# Patient Record
Sex: Female | Born: 1945 | Race: White | Hispanic: No | Marital: Married | Smoking: Former smoker
Health system: Southern US, Community
[De-identification: ages and names within clinical notes are randomized; demographics above are authoritative.]

## PROBLEM LIST (undated history)

## (undated) DIAGNOSIS — J45909 Unspecified asthma, uncomplicated: Secondary | ICD-10-CM

## (undated) DIAGNOSIS — L439 Lichen planus, unspecified: Secondary | ICD-10-CM

## (undated) DIAGNOSIS — N2889 Other specified disorders of kidney and ureter: Secondary | ICD-10-CM

## (undated) DIAGNOSIS — R0600 Dyspnea, unspecified: Secondary | ICD-10-CM

## (undated) DIAGNOSIS — R112 Nausea with vomiting, unspecified: Secondary | ICD-10-CM

## (undated) DIAGNOSIS — D649 Anemia, unspecified: Secondary | ICD-10-CM

## (undated) DIAGNOSIS — E7800 Pure hypercholesterolemia, unspecified: Secondary | ICD-10-CM

## (undated) DIAGNOSIS — M1990 Unspecified osteoarthritis, unspecified site: Secondary | ICD-10-CM

## (undated) DIAGNOSIS — E119 Type 2 diabetes mellitus without complications: Secondary | ICD-10-CM

## (undated) DIAGNOSIS — M549 Dorsalgia, unspecified: Secondary | ICD-10-CM

## (undated) DIAGNOSIS — K219 Gastro-esophageal reflux disease without esophagitis: Secondary | ICD-10-CM

## (undated) DIAGNOSIS — Z9889 Other specified postprocedural states: Secondary | ICD-10-CM

## (undated) DIAGNOSIS — I10 Essential (primary) hypertension: Secondary | ICD-10-CM

## (undated) HISTORY — DX: Unspecified asthma, uncomplicated: J45.909

## (undated) HISTORY — DX: Lichen planus, unspecified: L43.9

---
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
## 2000-10-02 ENCOUNTER — Other Ambulatory Visit: Admission: RE | Admit: 2000-10-02 | Discharge: 2000-10-02 | Payer: Self-pay | Admitting: *Deleted

## 2000-10-06 ENCOUNTER — Encounter: Payer: Self-pay | Admitting: Family Medicine

## 2000-10-06 ENCOUNTER — Ambulatory Visit (HOSPITAL_COMMUNITY): Admission: RE | Admit: 2000-10-06 | Discharge: 2000-10-06 | Payer: Self-pay | Admitting: Family Medicine

## 2002-02-07 ENCOUNTER — Ambulatory Visit (HOSPITAL_COMMUNITY): Admission: RE | Admit: 2002-02-07 | Discharge: 2002-02-07 | Payer: Self-pay | Admitting: Family Medicine

## 2002-02-07 ENCOUNTER — Encounter: Payer: Self-pay | Admitting: Family Medicine

## 2002-08-17 ENCOUNTER — Encounter: Payer: Self-pay | Admitting: Preventative Medicine

## 2002-08-17 ENCOUNTER — Encounter (HOSPITAL_COMMUNITY): Admission: RE | Admit: 2002-08-17 | Discharge: 2002-09-16 | Payer: Self-pay | Admitting: Preventative Medicine

## 2002-09-07 ENCOUNTER — Encounter (HOSPITAL_COMMUNITY): Admission: RE | Admit: 2002-09-07 | Discharge: 2002-10-07 | Payer: Self-pay | Admitting: Orthopaedic Surgery

## 2002-10-11 ENCOUNTER — Encounter (HOSPITAL_COMMUNITY): Admission: RE | Admit: 2002-10-11 | Discharge: 2002-11-10 | Payer: Self-pay | Admitting: Orthopaedic Surgery

## 2003-03-29 ENCOUNTER — Ambulatory Visit (HOSPITAL_COMMUNITY): Admission: RE | Admit: 2003-03-29 | Discharge: 2003-03-29 | Payer: Self-pay | Admitting: Internal Medicine

## 2004-03-29 ENCOUNTER — Ambulatory Visit (HOSPITAL_COMMUNITY): Admission: RE | Admit: 2004-03-29 | Discharge: 2004-03-29 | Payer: Self-pay | Admitting: Internal Medicine

## 2005-10-23 ENCOUNTER — Ambulatory Visit (HOSPITAL_COMMUNITY): Admission: RE | Admit: 2005-10-23 | Discharge: 2005-10-23 | Payer: Self-pay | Admitting: Family Medicine

## 2006-06-02 HISTORY — PX: COLONOSCOPY: SHX174

## 2006-11-09 ENCOUNTER — Ambulatory Visit (HOSPITAL_COMMUNITY): Admission: RE | Admit: 2006-11-09 | Discharge: 2006-11-09 | Payer: Self-pay | Admitting: Family Medicine

## 2006-12-28 ENCOUNTER — Ambulatory Visit (HOSPITAL_COMMUNITY): Admission: RE | Admit: 2006-12-28 | Discharge: 2006-12-28 | Payer: Self-pay | Admitting: Internal Medicine

## 2006-12-28 ENCOUNTER — Ambulatory Visit: Payer: Self-pay | Admitting: Internal Medicine

## 2006-12-28 ENCOUNTER — Encounter: Payer: Self-pay | Admitting: Internal Medicine

## 2007-07-11 ENCOUNTER — Emergency Department (HOSPITAL_COMMUNITY): Admission: EM | Admit: 2007-07-11 | Discharge: 2007-07-11 | Payer: Self-pay | Admitting: Family Medicine

## 2010-06-22 ENCOUNTER — Encounter: Payer: Self-pay | Admitting: Internal Medicine

## 2010-10-15 NOTE — Op Note (Signed)
NAME:  Kristine Zamora, Kristine Zamora                ACCOUNT NO.:  [id number]   MEDICAL RECORD NO.:  [id number]          PATIENT TYPE:  AMB   LOCATION:  DAY                           FACILITY:  [facility]   PHYSICIAN:  R. Roetta Sessions, M.D. DATE OF BIRTH:  Jan 30, 1946   DATE OF PROCEDURE:  12/28/2006  DATE OF DISCHARGE:                               OPERATIVE REPORT   PROCEDURE:  Colonoscopy with biopsy.   INDICATIONS FOR PROCEDURE:  Patient is a 65 year old lady who comes for  colorectal cancer screening.   Primary care physician is Dr. Corrie Mckusick, M.D.   Family history is significant in that her father succumbed to colorectal  cancer at age 17.  She reports having a colonoscopy some 12 years ago  without significant findings.  She has no lower GI tract symptoms.  Colonoscopy is now being done.  This approach has been discussed with  the patient at length.  Potential risks, benefits and alternatives have  been reviewed, questions answered, she is agreeable.  Please see  documentation on the medical record.   PROCEDURE NOTE:  O2 saturation, blood pressure, pulse, respirations were  monitored throughout the entire procedure.   CONSCIOUS SEDATION:  Versed 95 mg IV Demerol 75 mg IV in divided doses.   INSTRUMENT:  Pentax video chip system.   FINDINGS:  Digital rectal exam revealed no abnormalities.   ENDOSCOPIC FINDINGS:  Prep was good.   Colon:  Colonic mucosa was surveyed from the rectosigmoid junction  through the left transverse and right colon to the area of appendiceal  orifice, ileocecal valve and cecum.  These structures were well seen and  photographed for the record.  From this level the scope was slowly and  cautiously withdrawn.  All previously mentioned mucosal surfaces were  again seen.  The patient had pancolonic diverticula and diminutive 3 mm  at the rectosigmoid junction.  The remainder of colon mucosa appeared  normal.  The polyp was cold biopsied/removed.  The scope was then  pulled  down in the rectum where thorough examination of the rectal mucosa  including retroflexed view of the anal verge demonstrated no  abnormalities.  The patient tolerated procedure well as reactive to  endoscopy.   IMPRESSION:  Normal rectum, rectosigmoid polyp (diminutive), cold  biopsied/removed, pancolonic diverticula.  Remainder of colonic mucosa  appeared normal.   RECOMMENDATIONS:  1. Diverticulosis literature provided to [name].  2. Follow-up on path.  3. Further recommendations to follow.      Jonathon Bellows, M.D.  Electronically Signed     RMR/MEDQ  D:  12/28/2006  T:  12/28/2006  Job:  [id number]   cc:   Corrie Mckusick, M.D.  Fax: [phone]

## 2011-02-12 ENCOUNTER — Other Ambulatory Visit: Payer: Self-pay | Admitting: Interventional Radiology

## 2011-02-12 DIAGNOSIS — I83813 Varicose veins of bilateral lower extremities with pain: Secondary | ICD-10-CM

## 2011-02-25 ENCOUNTER — Inpatient Hospital Stay: Admission: RE | Admit: 2011-02-25 | Payer: Self-pay | Source: Ambulatory Visit

## 2011-05-30 ENCOUNTER — Other Ambulatory Visit (HOSPITAL_COMMUNITY): Payer: Self-pay | Admitting: Family Medicine

## 2011-05-30 DIAGNOSIS — Z139 Encounter for screening, unspecified: Secondary | ICD-10-CM

## 2011-06-05 ENCOUNTER — Ambulatory Visit (HOSPITAL_COMMUNITY)
Admission: RE | Admit: 2011-06-05 | Discharge: 2011-06-05 | Disposition: A | Discharge status: Home / Self Care | Payer: Medicare Other | Source: Ambulatory Visit | Attending: Family Medicine | Admitting: Family Medicine

## 2011-06-05 DIAGNOSIS — Z139 Encounter for screening, unspecified: Secondary | ICD-10-CM

## 2011-06-05 DIAGNOSIS — Z1231 Encounter for screening mammogram for malignant neoplasm of breast: Secondary | ICD-10-CM | POA: Insufficient documentation

## 2011-06-05 DIAGNOSIS — M818 Other osteoporosis without current pathological fracture: Secondary | ICD-10-CM | POA: Insufficient documentation

## 2011-12-01 ENCOUNTER — Encounter: Payer: Self-pay | Admitting: Internal Medicine

## 2011-12-29 ENCOUNTER — Ambulatory Visit (HOSPITAL_COMMUNITY)
Admission: RE | Admit: 2011-12-29 | Discharge: 2011-12-29 | Disposition: A | Discharge status: Home / Self Care | Payer: Medicare Other | Source: Ambulatory Visit | Attending: Family Medicine | Admitting: Family Medicine

## 2011-12-29 ENCOUNTER — Other Ambulatory Visit (HOSPITAL_COMMUNITY): Payer: Self-pay | Admitting: Family Medicine

## 2011-12-29 DIAGNOSIS — J209 Acute bronchitis, unspecified: Secondary | ICD-10-CM

## 2011-12-29 DIAGNOSIS — J069 Acute upper respiratory infection, unspecified: Secondary | ICD-10-CM | POA: Insufficient documentation

## 2011-12-29 DIAGNOSIS — R05 Cough: Secondary | ICD-10-CM | POA: Insufficient documentation

## 2011-12-29 DIAGNOSIS — R059 Cough, unspecified: Secondary | ICD-10-CM | POA: Insufficient documentation

## 2012-10-05 ENCOUNTER — Other Ambulatory Visit (HOSPITAL_COMMUNITY): Payer: Self-pay | Admitting: Family Medicine

## 2012-10-05 DIAGNOSIS — Z139 Encounter for screening, unspecified: Secondary | ICD-10-CM

## 2012-10-11 ENCOUNTER — Ambulatory Visit (HOSPITAL_COMMUNITY)
Admission: RE | Admit: 2012-10-11 | Discharge: 2012-10-11 | Disposition: A | Discharge status: Home / Self Care | Payer: Medicare Other | Source: Ambulatory Visit | Attending: Family Medicine | Admitting: Family Medicine

## 2012-10-11 DIAGNOSIS — Z1231 Encounter for screening mammogram for malignant neoplasm of breast: Secondary | ICD-10-CM | POA: Insufficient documentation

## 2012-10-11 DIAGNOSIS — Z139 Encounter for screening, unspecified: Secondary | ICD-10-CM

## 2012-10-12 ENCOUNTER — Other Ambulatory Visit: Payer: Self-pay | Admitting: Family Medicine

## 2012-10-12 DIAGNOSIS — R928 Other abnormal and inconclusive findings on diagnostic imaging of breast: Secondary | ICD-10-CM

## 2012-10-20 ENCOUNTER — Ambulatory Visit (HOSPITAL_COMMUNITY)
Admission: RE | Admit: 2012-10-20 | Discharge: 2012-10-20 | Disposition: A | Discharge status: Home / Self Care | Payer: Medicare Other | Source: Ambulatory Visit | Attending: Family Medicine | Admitting: Family Medicine

## 2012-10-20 DIAGNOSIS — R928 Other abnormal and inconclusive findings on diagnostic imaging of breast: Secondary | ICD-10-CM

## 2012-12-16 ENCOUNTER — Encounter (HOSPITAL_COMMUNITY): Payer: Self-pay | Admitting: *Deleted

## 2012-12-16 ENCOUNTER — Emergency Department (HOSPITAL_COMMUNITY)
Admission: EM | Admit: 2012-12-16 | Discharge: 2012-12-16 | Disposition: A | Discharge status: Home / Self Care | Payer: Medicare Other | Attending: Emergency Medicine | Admitting: Emergency Medicine

## 2012-12-16 DIAGNOSIS — Z79899 Other long term (current) drug therapy: Secondary | ICD-10-CM | POA: Insufficient documentation

## 2012-12-16 DIAGNOSIS — R0609 Other forms of dyspnea: Secondary | ICD-10-CM | POA: Insufficient documentation

## 2012-12-16 DIAGNOSIS — I10 Essential (primary) hypertension: Secondary | ICD-10-CM | POA: Insufficient documentation

## 2012-12-16 DIAGNOSIS — R109 Unspecified abdominal pain: Secondary | ICD-10-CM | POA: Insufficient documentation

## 2012-12-16 DIAGNOSIS — T7840XA Allergy, unspecified, initial encounter: Secondary | ICD-10-CM

## 2012-12-16 DIAGNOSIS — R0989 Other specified symptoms and signs involving the circulatory and respiratory systems: Secondary | ICD-10-CM | POA: Insufficient documentation

## 2012-12-16 DIAGNOSIS — L988 Other specified disorders of the skin and subcutaneous tissue: Secondary | ICD-10-CM | POA: Insufficient documentation

## 2012-12-16 DIAGNOSIS — E119 Type 2 diabetes mellitus without complications: Secondary | ICD-10-CM | POA: Insufficient documentation

## 2012-12-16 DIAGNOSIS — R110 Nausea: Secondary | ICD-10-CM | POA: Insufficient documentation

## 2012-12-16 DIAGNOSIS — T394X5A Adverse effect of antirheumatics, not elsewhere classified, initial encounter: Secondary | ICD-10-CM | POA: Insufficient documentation

## 2012-12-16 HISTORY — DX: Essential (primary) hypertension: I10

## 2012-12-16 HISTORY — DX: Dorsalgia, unspecified: M54.9

## 2012-12-16 HISTORY — DX: Type 2 diabetes mellitus without complications: E11.9

## 2012-12-16 LAB — CBC WITH DIFFERENTIAL/PLATELET
Basophils Absolute: 0 10*3/uL (ref 0.0–0.1)
Basophils Relative: 0 % (ref 0–1)
Eosinophils Absolute: 0.1 10*3/uL (ref 0.0–0.7)
Eosinophils Relative: 1 % (ref 0–5)
HCT: 40.5 % (ref 36.0–46.0)
MCH: 29.3 pg (ref 26.0–34.0)
MCHC: 33.3 g/dL (ref 30.0–36.0)
MCV: 87.9 fL (ref 78.0–100.0)
Monocytes Absolute: 0.6 10*3/uL (ref 0.1–1.0)
Neutro Abs: 11.4 10*3/uL — ABNORMAL HIGH (ref 1.7–7.7)
RDW: 13 % (ref 11.5–15.5)

## 2012-12-16 LAB — BASIC METABOLIC PANEL
Calcium: 8.6 mg/dL (ref 8.4–10.5)
Creatinine, Ser: 0.59 mg/dL (ref 0.50–1.10)
GFR calc Af Amer: >90 mL/min (ref >90)

## 2012-12-16 LAB — GLUCOSE, CAPILLARY: Glucose-Capillary: 211 mg/dL — ABNORMAL HIGH (ref 70–99)

## 2012-12-16 LAB — LACTIC ACID, PLASMA: Lactic Acid, Venous: 4 mmol/L — ABNORMAL HIGH (ref 0.5–2.2)

## 2012-12-16 MED ORDER — ONDANSETRON HCL 4 MG/2ML IJ SOLN
4.0000 mg | Freq: Once | INTRAMUSCULAR | Status: AC
  Administered 2012-12-16: 4 mg via INTRAVENOUS
  Filled 2012-12-16: qty 2

## 2012-12-16 MED ORDER — FAMOTIDINE IN NACL 20-0.9 MG/50ML-% IV SOLN
20.0000 mg | Freq: Once | INTRAVENOUS | Status: AC
  Administered 2012-12-16: 20 mg via INTRAVENOUS
  Filled 2012-12-16: qty 50

## 2012-12-16 MED ORDER — SODIUM CHLORIDE 0.9 % IV SOLN: Freq: Once | INTRAVENOUS | Status: AC

## 2012-12-16 MED ORDER — METHYLPREDNISOLONE SODIUM SUCC 125 MG IJ SOLR: 125.0000 mg | Freq: Once | INTRAMUSCULAR | Status: DC

## 2012-12-16 NOTE — ED Notes (Signed)
Pt arrives with IV from EMS, NS bolus infusing.

## 2012-12-16 NOTE — ED Notes (Signed)
Pt arrives by EMS, generalized red skin, puffy eyes, swollen hands. Pt states she took her husband meds before bedtime, nausea, husband called EMS.

## 2012-12-16 NOTE — ED Provider Notes (Signed)
History    CSN: [id number] Arrival date & time 12/16/12  0501  First MD Initiated Contact with Patient 12/16/12 [phone]     Chief Complaint  Patient presents with  . Allergic Reaction   (Consider location/radiation/quality/duration/timing/severity/associated sxs/prior Treatment) Patient is a 67 y.o. female presenting with allergic reaction. The history is provided by the patient.  Allergic Reaction She noted onset about 30 minutes ago of a burning sensation in her arms and legs and some abdominal pain as well as dyspnea. She denies chest pain or difficulty swallowing. There was mild dyspnea and nausea without vomiting. She states that her mouth is dry. Symptoms occurred about 2 hours after taking a dose of Zanaflex and diclofenac. She is taking these medications before without incident. She took medications because she has been having pain in the right side of her back for about the last 3 weeks. She called EMS who have given her epinephrine and diphenhydramine with moderate improvement. Past Medical History  Diagnosis Date  . Diabetes mellitus without complication   . Hypertension   . Back pain    History reviewed. No pertinent past surgical history. History reviewed. No pertinent family history. History  Substance Use Topics  . Smoking status: Not on file  . Smokeless tobacco: Not on file  . Alcohol Use: No   OB History   Grav Para Term Preterm Abortions TAB SAB Ect Mult Living                 Review of Systems  All other systems reviewed and are negative.    Allergies  Review of patient's allergies indicates no known allergies.  Home Medications   Current Outpatient Rx  Name  Route  Sig  Dispense  Refill  . lisinopril (PRINIVIL,ZESTRIL) 10 MG tablet   Oral   Take 10 mg by mouth daily.         . metFORMIN (GLUMETZA) 1000 MG (MOD) 24 hr tablet   Oral   Take 1,000 mg by mouth daily with breakfast.          BP 96/58  Pulse 105  Temp(Src) 97.5 F (36.4 C)  (Oral)  Resp 20  Ht 5\' 2"  (1.575 m)  Wt 240 lb (108.863 kg)  BMI 43.89 kg/m2  SpO2 95% Physical Exam  Nursing note and vitals reviewed.  67 year old female, resting comfortably and in no acute distress. Vital signs are significant for tachycardia with heart rate of 105. Oxygen saturation is 95%, which is normal. Head is normocephalic and atraumatic. PERRLA, EOMI. Oropharynx is clear. Neck is nontender and supple without adenopathy or JVD. Back is nontender and there is no CVA tenderness. Lungs are clear without rales, wheezes, or rhonchi. There is no stridor. Chest is nontender. Heart has regular rate and rhythm without murmur. Abdomen is soft, flat, nontender without masses or hepatosplenomegaly and peristalsis is normoactive. Extremities have no cyanosis or edema, full range of motion is present. Skin is warm and dry with generalized erythroderma. There no urticarial lesions seen. Neurologic: Mental status is normal, cranial nerves are intact, there are no motor or sensory deficits.   ED Course  Procedures (including critical care time) Labs Reviewed - No data to display   Date: 12/16/2012  Rate: 104  Rhythm: sinus tachycardia  QRS Axis: left  Intervals: normal  ST/T Wave abnormalities: nonspecific ST changes  Conduction Disutrbances:none  Narrative Interpretation: Low voltage, nonspecific ST changes. No prior ECG available for comparison.  Old EKG Reviewed: none available  1. Allergic reaction caused by a drug     MDM  Apparent allergic reaction which is most likely to either diclofenac or Zanaflex She has received diphenhydramine and epinephrine and she will be given famotidine and observed in the emergency department. Because she is diabetic, steroids were not given.  Following the above noted treatment, she is feeling much better. Her erythroderma has resolved. Laboratory workup is pending but I anticipate sending her home with instructions to use over-the-counter  diphenhydramine on an as-needed basis.  Dione Booze, MD 12/17/12 [phone]

## 2012-12-16 NOTE — ED Notes (Signed)
Pt to department via EMS.  Per report, pt took one diclofenac and one tizanidine that was prescribed to her husband because her back was hurting.  Pt reports taking medication about 0300.  EMS reports 0.3 mg of epi IM and 50 mg of Benadryl IV on scene. EMS also administered albuterol on scene.  At present, pt interactive.  Skin flushed, warm and dry.  Pt does report nausea, no vomiting.

## 2012-12-16 NOTE — ED Notes (Signed)
nad noted prior to dc. Dc instructions reviewed with pt and explained. F/u appt discussed if needed pt voiced understanding.

## 2013-10-18 ENCOUNTER — Encounter: Payer: Self-pay | Admitting: Obstetrics & Gynecology

## 2013-10-18 ENCOUNTER — Ambulatory Visit (INDEPENDENT_AMBULATORY_CARE_PROVIDER_SITE_OTHER): Payer: Medicare Other | Admitting: Obstetrics & Gynecology

## 2013-10-18 VITALS — BP 120/80 | Ht 62.0 in | Wt 230.0 lb

## 2013-10-18 DIAGNOSIS — L940 Localized scleroderma [morphea]: Secondary | ICD-10-CM

## 2013-10-18 DIAGNOSIS — N904 Leukoplakia of vulva: Secondary | ICD-10-CM

## 2013-10-18 MED ORDER — FLUCONAZOLE 100 MG PO TABS: ORAL_TABLET | ORAL | Status: DC

## 2013-10-18 NOTE — Progress Notes (Signed)
Patient ID: Kristine Zamora, female   DOB: 1946/01/08, 69 y.o.   MRN: [id number] Chief Complaint  Patient presents with  . GYN VISIT    burning and itching vaginal area.    Blood pressure 120/80, height 5\' 2"  (1.575 m), weight 230 lb (104.327 kg).  Pt seen 2 years ago for similar symptoms, says nothing helped She thinks I used temovate which I prompted her to relate Has diabetes  Burns when urine hits it, itching severely, burns worse  No burning with urination, frequency or urgency No nausea, vomiting or diarrhea Nor fever chills or other constitutional symptoms  Exam Erythema of the vaginal introitus vulva, looks pretty raw Not specifically yeast Vagina  no yeast seen  Impression Lichen sclerosus +/- yeast  Plan Gentian violet placed Will use oral diflucan 100 qhs x 2 weeks  Will hold on topical steroids for now  Past Medical History  Diagnosis Date  . Diabetes mellitus without complication   . Hypertension   . Back pain   . Asthma     History reviewed. No pertinent past surgical history.  OB History   Grav Para Term Preterm Abortions TAB SAB Ect Mult Living                  No Known Allergies  History   Social History  . Marital Status: Married    Spouse Name: N/A    Number of Children: N/A  . Years of Education: N/A   Social History Main Topics  . Smoking status: Former Research scientist (life sciences)  . Smokeless tobacco: None  . Alcohol Use: No  . Drug Use: No  . Sexual Activity: No   Other Topics Concern  . None   Social History Narrative  . None    Family History  Problem Relation Age of Onset  . Alcohol abuse Mother     old age  . Colon cancer Father

## 2013-10-20 ENCOUNTER — Telehealth: Payer: Self-pay | Admitting: Obstetrics & Gynecology

## 2013-10-20 NOTE — Telephone Encounter (Signed)
The pt misunderstood, diflucan only now, see back in 2 weeks...please refer to my office note

## 2013-10-20 NOTE — Telephone Encounter (Signed)
Pt informed diflucan only and to keep her appt in two weeks.

## 2013-10-20 NOTE — Telephone Encounter (Signed)
Pt states saw Dr. Elonda Husky on 10/18/2013 and thought Dr. Elonda Husky was going to RX a vaginal cream. Pt states did get Diflucan.

## 2013-11-03 ENCOUNTER — Ambulatory Visit: Payer: Medicare Other | Admitting: Obstetrics & Gynecology

## 2013-11-08 ENCOUNTER — Ambulatory Visit (INDEPENDENT_AMBULATORY_CARE_PROVIDER_SITE_OTHER): Payer: Medicare Other | Admitting: Obstetrics & Gynecology

## 2013-11-08 ENCOUNTER — Telehealth: Payer: Self-pay | Admitting: Obstetrics & Gynecology

## 2013-11-08 ENCOUNTER — Telehealth: Payer: Self-pay | Admitting: *Deleted

## 2013-11-08 ENCOUNTER — Encounter: Payer: Self-pay | Admitting: Obstetrics & Gynecology

## 2013-11-08 VITALS — BP 100/70 | Wt 228.0 lb

## 2013-11-08 DIAGNOSIS — L940 Localized scleroderma [morphea]: Secondary | ICD-10-CM

## 2013-11-08 DIAGNOSIS — N904 Leukoplakia of vulva: Secondary | ICD-10-CM

## 2013-11-08 MED ORDER — CLOBETASOL PROPIONATE 0.05 % EX CREA
1.0000 | TOPICAL_CREAM | Freq: Two times a day (BID) | CUTANEOUS | Status: DC
Start: 2013-11-08 — End: 2019-02-04

## 2013-11-08 NOTE — Telephone Encounter (Signed)
Spoke with pt. Pt states the Clobetasol cream is $100.00. Can you prescribe something cheaper? Thanks!!! [employer]

## 2013-11-08 NOTE — Progress Notes (Signed)
Patient ID: Kristine Zamora, female   DOB: 02/15/1946, 68 y.o.   MRN: [id number] Patient ID: Kristine Zamora, female   DOB: 07-02-1945, 68 y.o.   MRN: [id number] Chief Complaint  Patient presents with  . Follow-up    LICHEN SCLEROSUS.  pt in today for follow up Doing well Much better Finished her diflucan  No burning with urination, frequency or urgency No nausea, vomiting or diarrhea Nor fever chills or other constitutional symptoms  Exam NEFG Vulva with erythema at the introitus Vagina normal  Assessment:Plan LSA:  Begin temovate 0.5% BID FU 3 moths for re evaluation     Previous Note:  Blood pressure 100/70, weight 228 lb (103.42 kg).  Pt seen 2 years ago for similar symptoms, says nothing helped She thinks I used temovate which I prompted her to relate Has diabetes  Burns when urine hits it, itching severely, burns worse  No burning with urination, frequency or urgency No nausea, vomiting or diarrhea Nor fever chills or other constitutional symptoms  Exam Erythema of the vaginal introitus vulva, looks pretty raw Not specifically yeast Vagina  no yeast seen  Impression Lichen sclerosus +/- yeast  Plan Gentian violet placed Will use oral diflucan 100 qhs x 2 weeks  Will hold on topical steroids for now  Past Medical History  Diagnosis Date  . Diabetes mellitus without complication   . Hypertension   . Back pain   . Asthma     History reviewed. No pertinent past surgical history.  OB History   Grav Para Term Preterm Abortions TAB SAB Ect Mult Living                  Allergies  Allergen Reactions  . 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 Marital Status: Married    Spouse Name: N/A    Number of Children: N/A  . Years of Education: N/A   Social History Main Topics  . Smoking status: Former Research scientist (life sciences)  . Smokeless tobacco: None  . Alcohol Use: No  . Drug Use: No  . Sexual Activity: No   Other Topics Concern  .  None   Social History Narrative  . None    Family History  Problem Relation Age of Onset  . Alcohol abuse Mother     old age  . Colon cancer Father   . Breast cancer Daughter

## 2013-11-08 NOTE — Telephone Encounter (Signed)
Pt states called several pharmacies in regards to cost of clobetasol all $100.00 or more, can Dr. Elonda Husky prescribe medication that is cheaper?

## 2013-11-08 NOTE — Telephone Encounter (Signed)
I spoke with Dr. Elonda Husky and he advised the cream should not be that expensive. It is generic. He advised pt to shop around to different drug stores for a cheaper price. I called pt and gave her the information to tell the drug stores. Pt voiced understanding. [place]

## 2014-01-30 ENCOUNTER — Other Ambulatory Visit (HOSPITAL_COMMUNITY): Payer: Self-pay | Admitting: Family Medicine

## 2014-01-30 DIAGNOSIS — Z139 Encounter for screening, unspecified: Secondary | ICD-10-CM

## 2014-02-02 ENCOUNTER — Ambulatory Visit (HOSPITAL_COMMUNITY)
Admission: RE | Admit: 2014-02-02 | Discharge: 2014-02-02 | Disposition: A | Discharge status: Home / Self Care | Payer: Medicare Other | Source: Ambulatory Visit | Attending: Family Medicine | Admitting: Family Medicine

## 2014-02-02 DIAGNOSIS — Z139 Encounter for screening, unspecified: Secondary | ICD-10-CM

## 2014-02-02 DIAGNOSIS — Z1231 Encounter for screening mammogram for malignant neoplasm of breast: Secondary | ICD-10-CM | POA: Insufficient documentation

## 2014-02-07 ENCOUNTER — Ambulatory Visit (INDEPENDENT_AMBULATORY_CARE_PROVIDER_SITE_OTHER): Payer: Medicare Other | Admitting: Obstetrics & Gynecology

## 2014-02-07 ENCOUNTER — Encounter: Payer: Self-pay | Admitting: Obstetrics & Gynecology

## 2014-02-07 VITALS — BP 120/80 | Wt 234.0 lb

## 2014-02-07 DIAGNOSIS — L940 Localized scleroderma [morphea]: Secondary | ICD-10-CM

## 2014-02-07 DIAGNOSIS — N764 Abscess of vulva: Secondary | ICD-10-CM | POA: Insufficient documentation

## 2014-02-07 DIAGNOSIS — N904 Leukoplakia of vulva: Secondary | ICD-10-CM

## 2014-02-07 MED ORDER — SULFAMETHOXAZOLE-TMP DS 800-160 MG PO TABS: 1.0000 | ORAL_TABLET | Freq: Two times a day (BID) | ORAL | Status: DC

## 2014-02-07 NOTE — Progress Notes (Signed)
Patient ID: JAMALA KOHEN, female   DOB: 1945/06/14, 68 y.o.   MRN: [id number] Chief Complaint  Patient presents with  . Follow-up    HPI Pt with lichen sclerosus "62%" better  ROS No burning with urination, frequency or urgency No nausea, vomiting or diarrhea Nor fever chills or other constitutional symptoms   Blood pressure 120/80, weight 234 lb (106.142 kg).  EXAM Abdomen:      soft, nontender Vulva:            LSA is improved but has small boil on right vulva Vagina:          normal mucosa, no discharge Cervix:            Uterus:           Adnexa:          Rectal:            Hemoccult:                               Assessment/Plan:  LSA Boil on vulva  Continue clobetasol and take bactrim x 10 days

## 2014-09-27 ENCOUNTER — Other Ambulatory Visit (HOSPITAL_COMMUNITY): Payer: Self-pay | Admitting: Family Medicine

## 2014-09-27 DIAGNOSIS — R109 Unspecified abdominal pain: Secondary | ICD-10-CM

## 2014-09-27 DIAGNOSIS — E119 Type 2 diabetes mellitus without complications: Secondary | ICD-10-CM | POA: Diagnosis not present

## 2014-09-27 DIAGNOSIS — I10 Essential (primary) hypertension: Secondary | ICD-10-CM | POA: Diagnosis not present

## 2014-09-27 DIAGNOSIS — Z6841 Body Mass Index (BMI) 40.0 and over, adult: Secondary | ICD-10-CM | POA: Diagnosis not present

## 2014-09-27 DIAGNOSIS — R1011 Right upper quadrant pain: Secondary | ICD-10-CM | POA: Diagnosis not present

## 2014-09-27 DIAGNOSIS — J4520 Mild intermittent asthma, uncomplicated: Secondary | ICD-10-CM | POA: Diagnosis not present

## 2014-09-28 DIAGNOSIS — E784 Other hyperlipidemia: Secondary | ICD-10-CM | POA: Diagnosis not present

## 2014-09-28 DIAGNOSIS — E119 Type 2 diabetes mellitus without complications: Secondary | ICD-10-CM | POA: Diagnosis not present

## 2014-10-02 ENCOUNTER — Ambulatory Visit (HOSPITAL_COMMUNITY)
Admission: RE | Admit: 2014-10-02 | Discharge: 2014-10-02 | Disposition: A | Discharge status: Home / Self Care | Payer: Medicare Other | Source: Ambulatory Visit | Attending: Family Medicine | Admitting: Family Medicine

## 2014-10-02 DIAGNOSIS — R109 Unspecified abdominal pain: Secondary | ICD-10-CM | POA: Insufficient documentation

## 2014-10-02 DIAGNOSIS — N281 Cyst of kidney, acquired: Secondary | ICD-10-CM | POA: Diagnosis not present

## 2014-10-02 DIAGNOSIS — K7689 Other specified diseases of liver: Secondary | ICD-10-CM | POA: Diagnosis not present

## 2014-10-10 ENCOUNTER — Encounter: Payer: Self-pay | Admitting: Internal Medicine

## 2014-10-31 ENCOUNTER — Ambulatory Visit (INDEPENDENT_AMBULATORY_CARE_PROVIDER_SITE_OTHER): Payer: Medicare Other | Admitting: Gastroenterology

## 2014-10-31 ENCOUNTER — Encounter: Payer: Self-pay | Admitting: Gastroenterology

## 2014-10-31 VITALS — BP 136/72 | HR 93 | Temp 98.1°F | Ht 60.0 in | Wt 232.4 lb

## 2014-10-31 DIAGNOSIS — Z800 Family history of malignant neoplasm of digestive organs: Secondary | ICD-10-CM | POA: Insufficient documentation

## 2014-10-31 DIAGNOSIS — K219 Gastro-esophageal reflux disease without esophagitis: Secondary | ICD-10-CM

## 2014-10-31 NOTE — Assessment & Plan Note (Addendum)
69 year old female with last colonoscopy in 2008 noting without adenomas, overdue for screening colonoscopy due to family history of colon cancer in father, diagnoses in early 53s. No concerning lower GI symptoms.   Proceed with TCS with Dr. Gala Romney in near future: the risks, benefits, and alternatives have been discussed with the patient in detail. The patient states understanding and desires to proceed.

## 2014-10-31 NOTE — Patient Instructions (Addendum)
Please follow the attached reflux diet. IF you still have problems despite following the diet and behavior modifications, we will need to start you on a reflux and possibly proceed with an upper endoscopy.  We will call you on Friday to schedule a colonoscopy with Dr. Gala Romney. Please call us if you do not hear from [place].   Food Choices for Gastroesophageal Reflux Disease When you have gastroesophageal reflux disease (GERD), the foods you eat and your eating habits are very important. Choosing the right foods can help ease the discomfort of GERD. WHAT GENERAL GUIDELINES DO I NEED TO FOLLOW?  Choose fruits, vegetables, whole grains, low-fat dairy products, and low-fat meat, fish, and poultry.  Limit fats such as oils, salad dressings, butter, nuts, and avocado.  Keep a food diary to identify foods that cause symptoms.  Avoid foods that cause reflux. These may be different for different people.  Eat frequent small meals instead of three large meals each day.  Eat your meals slowly, in a relaxed setting.  Limit fried foods.  Cook foods using methods other than frying.  Avoid drinking alcohol.  Avoid drinking large amounts of liquids with your meals.  Avoid bending over or lying down until 2-3 hours after eating. WHAT FOODS ARE NOT RECOMMENDED? The following are some foods and drinks that may worsen your symptoms: Vegetables Tomatoes. Tomato juice. Tomato and spaghetti sauce. Chili peppers. Onion and garlic. Horseradish. Fruits Oranges, grapefruit, and lemon (fruit and juice). Meats High-fat meats, fish, and poultry. This includes hot dogs, ribs, ham, sausage, salami, and bacon. Dairy Whole milk and chocolate milk. Sour cream. Cream. Butter. Ice cream. Cream cheese.  Beverages Coffee and tea, with or without caffeine. Carbonated beverages or energy drinks. Condiments Hot sauce. Barbecue sauce.  Sweets/Desserts Chocolate and cocoa. Donuts. Peppermint and spearmint. Fats and  Oils High-fat foods, including [place] fries and potato chips. Other Vinegar. Strong spices, such as black pepper, white pepper, red pepper, cayenne, curry powder, cloves, ginger, and chili powder. The items listed above may not be a complete list of foods and beverages to avoid. Contact your dietitian for more information. Document Released: 05/19/2005 Document Revised: 05/24/2013 Document Reviewed: 03/23/2013 [facility] Patient Information 2015 [place], [place]. This information is not intended to replace advice given to you by your health care provider. Make sure you discuss any questions you have with your health care provider.   Gastroesophageal Reflux Disease, Adult Gastroesophageal reflux disease (GERD) happens when acid from your stomach goes into your food pipe (esophagus). The acid can cause a burning feeling in your chest. Over time, the acid can make small holes (ulcers) in your food pipe.  HOME CARE  Ask your doctor for advice about:  Losing weight.  Quitting smoking.  Alcohol use.  Avoid foods and drinks that make your problems worse. You may want to avoid:  Caffeine and alcohol.  Chocolate.  Mints.  Garlic and onions.  Spicy foods.  Citrus fruits, such as oranges, lemons, or limes.  Foods that contain tomato, such as sauce, chili, salsa, and pizza.  Fried and fatty foods.  Avoid lying down for 3 hours before you go to bed or before you take a nap.  Eat small meals often, instead of large meals.  Wear loose-fitting clothing. Do not wear anything tight around your waist.  Raise (elevate) the head of your bed 6 to 8 inches with wood blocks. Using extra pillows does not help.  Only take medicines as told by your doctor.  Do not  take aspirin or ibuprofen. GET HELP RIGHT AWAY IF:   You have pain in your arms, neck, jaw, teeth, or back.  Your pain gets worse or changes.  You feel sick to your stomach (nauseous), throw up (vomit), or sweat  (diaphoresis).  You feel short of breath, or you pass out (faint).  Your throw up is green, yellow, black, or looks like coffee grounds or blood.  Your poop (stool) is red, bloody, or black. MAKE SURE YOU:   Understand these instructions.  Will watch your condition.  Will get help right away if you are not doing well or get worse. Document Released: 11/05/2007 Document Revised: 08/11/2011 Document Reviewed: 12/06/2010 [facility] Patient Information 2015 [place], [place]. This information is not intended to replace advice given to you by your health care provider. Make sure you discuss any questions you have with your health care provider.

## 2014-10-31 NOTE — Progress Notes (Signed)
Primary Care Physician:  Purvis Kilts, MD Primary Gastroenterologist:  Dr. Gala Romney   Chief Complaint  Patient presents with  . Abdominal Pain    HPI:   Kristine Zamora is a 69 y.o. female presenting today at the request of Dr. Hilma Favors secondary to abdominal pain. Last colonoscopy in 2008 with hyperplastic polyp and pancolonic diverticula.   States no symptoms since [place] completed. States she had sensation of hot water in upper abdomen, bulging, got hard. No abdominal pain per se.  States she would have hot water rolling out of her mouth when she stood up over the commode. States symptoms were frequent. Mostly symptoms were at night. Associated nausea. Symptom onset about a month ago and were intermittent in nature. No symptoms since early May after ultrasound. 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  Zantac intermittently if wakes up sick at night. Not on a PPI. No reflux symptoms during the day. No dysphagia. No unexplained weight loss or lack of appetite. Diarrhea if she eats lettuce, spicy foods, salad. No rectal bleeding. Snacks at night.     Past Medical History  Diagnosis Date  . Diabetes mellitus without complication   . Hypertension   . Back pain   . Asthma     Past Surgical History  Procedure Laterality Date  . Colonoscopy  2008    Dr. Gala Romney: pancolonic diverticula, hyperplastic rectosigmoid polyp    Current Outpatient Prescriptions  Medication Sig Dispense Refill  . aspirin 81 MG tablet Take 81 mg by mouth daily.    . clobetasol cream (TEMOVATE) 9.32 % Apply 1 application topically 2 (two) times daily. 30 g 11  . lisinopril (PRINIVIL,ZESTRIL) 10 MG tablet Take 10 mg by mouth daily.    . metFORMIN (GLUMETZA) 1000 MG (MOD) 24 hr tablet Take 1,000 mg by mouth 2 (two) times daily with a meal.     . pravastatin (PRAVACHOL) 20 MG tablet Take 20 mg by mouth daily.      No current facility-administered medications for this visit.    Allergies as of 10/31/2014 - Review Complete 10/31/2014  Allergen Reaction Noted  . Peach [prunus persica] Anaphylaxis 11/08/2013    Family History  Problem Relation Age of Onset  . Alcohol abuse Mother     old age  . Colon cancer Father     diagnosed in his early 14s  . Breast cancer Daughter     History   Social History  . Marital Status: Married    Spouse Name: N/A  . Number of Children: N/A  . Years of Education: N/A   Occupational History  . Not on file.   Social History Main Topics  . Smoking status: Former Research scientist (life sciences)  . Smokeless tobacco: Not on file  . Alcohol Use: No  . Drug Use: No  . Sexual Activity: No   Other Topics Concern  . Not on file   Social History Narrative    Review of Systems: Gen: see HPI CV: Denies chest pain, heart palpitations, peripheral edema, syncope.  Resp: Denies shortness of breath at rest or with exertion. Denies wheezing or cough.  GI: see HPI GU : +urinary frequency,  MS: +joint pain Derm: Denies rash, itching, dry skin Psych: Denies depression, anxiety, memory loss, and confusion Heme: Denies bruising, bleeding, and enlarged lymph nodes.  Physical Exam: BP 136/72 mmHg  Pulse 93  Temp(Src) 98.1  F (36.7 C) (Oral)  Ht 5' (1.524 m)  Wt 232 lb 6.4 oz (105.416 kg)  BMI 45.39 kg/m2 General:   Alert and oriented. Pleasant and cooperative. Well-nourished and well-developed.  Head:  Normocephalic and atraumatic. Eyes:  Without icterus, sclera clear and conjunctiva pink.  Ears:  Normal auditory acuity. Nose:  No deformity, discharge,  or lesions. Mouth:  No deformity or lesions, oral mucosa pink.  Lungs:  Clear to auscultation bilaterally. No wheezes, rales, or rhonchi. No distress.  Heart:  S1, S2 present without murmurs appreciated.  Abdomen:  +BS, soft, non-tender and non-distended. No HSM noted. No guarding or rebound. No masses appreciated.  Rectal:   Deferred  Msk:  Symmetrical without gross deformities. Normal posture. Extremities:  Without  edema. Neurologic:  Alert and  oriented x4;  grossly normal neurologically. Psych:  Alert and cooperative. Normal mood and affect.

## 2014-11-06 NOTE — Assessment & Plan Note (Signed)
Intermittent reflux symptoms, regurgitation, and nausea that have since resolved since ultrasound of abdomen completed recently. Findings of mildly septated cyst in left lobe of liver and several sub-centimeter liver cysts noted. Fatty liver. Likely behavior and diet choices as culprit for GERD symptoms; she is declining a PPI currently. Discussed diet and behavior modification, with need for EGD if no improvement in symptoms. GERD diet handout provided. Will discuss ultrasound findings with Dr. Thornton Papas to determine if further evaluation via CT or MRI is warranted.

## 2014-11-07 ENCOUNTER — Telehealth: Payer: Self-pay

## 2014-11-07 NOTE — Telephone Encounter (Signed)
Called pt to arrange her procedures. She states that her husband just had open heart surgery and that right now is not a good time to set up anything. She will call back to make another office visit when things get settled with her husband.

## 2014-11-15 NOTE — Progress Notes (Signed)
cc'd to pcp 

## 2014-12-21 ENCOUNTER — Encounter: Payer: Self-pay | Admitting: Nurse Practitioner

## 2014-12-21 ENCOUNTER — Ambulatory Visit (INDEPENDENT_AMBULATORY_CARE_PROVIDER_SITE_OTHER): Payer: Medicare Other | Admitting: Nurse Practitioner

## 2014-12-21 ENCOUNTER — Other Ambulatory Visit: Payer: Self-pay

## 2014-12-21 VITALS — BP 123/66 | HR 79 | Temp 98.4°F | Ht 62.0 in | Wt 227.8 lb

## 2014-12-21 DIAGNOSIS — Z800 Family history of malignant neoplasm of digestive organs: Secondary | ICD-10-CM

## 2014-12-21 MED ORDER — NA SULFATE-K SULFATE-MG SULF 17.5-3.13-1.6 GM/177ML PO SOLN: 1.0000 | ORAL | Status: DC

## 2014-12-21 NOTE — Progress Notes (Signed)
cc'ed to pcp °

## 2014-12-21 NOTE — Progress Notes (Signed)
Referring Provider: Sharilyn Sites, MD Primary Care Physician:  Purvis Kilts, MD Primary GI:  Dr. Gala Romney  Chief Complaint  Patient presents with  . Follow-up    needs tcs    HPI:   69 year old female presents for follow-up and to schedule a colonoscopy. Last seen 10/31/2014 for abdominal pain which at that time had resolved after ultrasound imaging. She was overdue for surveillance colonoscopy due to high risk family history of colon cancer age greater than 34. Last colonoscopy in 2008 with a single diminutive polyp which was hyperplastic. She was scheduled for her colonoscopy however she had to cancel due to her husband bleeding open heart surgery. Review of abdominal ultrasound completed 10/02/2014 shows normal gallbladder and common bile duct. Liver was noted to have a cyst measuring 5.5 x 5.8 x 3.8 cm an increase echogenicity consistent with hepatic steatosis. Last ultrasound done in 2003 shows similar benign-appearing cyst measuring 4.1 x 4.2 x 2.7 cm. This represents minimal enlargement over the past 13 years.  Today she states she is not having abdominal pain currently. Deneis N/V, hematochezia, melena, unintentional weight loss, unexplained fevers. Her husband had CABG 1.5 months ago and states she's handling the stress very well. Denies chest pain, dyspnea, dizziness, lightheadedness, syncope, near syncope. Denies any other upper or lower GI symptoms.  Past Medical History  Diagnosis Date  . Diabetes mellitus without complication   . Hypertension   . Back pain   . Asthma   . Lichen planus atrophicus     Past Surgical History  Procedure Laterality Date  . Colonoscopy  2008    Dr. Gala Romney: pancolonic diverticula, hyperplastic rectosigmoid polyp    Current Outpatient Prescriptions  Medication Sig Dispense Refill  . aspirin 81 MG tablet Take 81 mg by mouth daily.    . clobetasol cream (TEMOVATE) 7.74 % Apply 1 application topically 2 (two) times daily. 30 g 11  .  lisinopril (PRINIVIL,ZESTRIL) 10 MG tablet Take 10 mg by mouth daily.    . metFORMIN (GLUMETZA) 1000 MG (MOD) 24 hr tablet Take 1,000 mg by mouth 2 (two) times daily with a meal.     . pravastatin (PRAVACHOL) 20 MG tablet Take 20 mg by mouth daily.     No current facility-administered medications for this visit.    Allergies as of 12/21/2014 - Review Complete 12/21/2014  Allergen Reaction Noted  . Peach [prunus persica] Anaphylaxis 11/08/2013    Family History  Problem Relation Age of Onset  . Alcohol abuse Mother     old age  . Colon cancer Father     diagnosed in his early 7s  . Breast cancer Daughter     History   Social History  . Marital Status: Married    Spouse Name: N/A  . Number of Children: N/A  . Years of Education: N/A   Social History Main Topics  . Smoking status: Former Research scientist (life sciences)  . Smokeless tobacco: Not on file  . Alcohol Use: No  . Drug Use: No  . Sexual Activity: No   Other Topics Concern  . None   Social History Narrative    Review of Systems: General: Negative for anorexia, weight loss, fever, chills, fatigue, weakness. Eyes: Negative for vision changes.  ENT: Negative for hoarseness, difficulty swallowing. CV: Negative for chest pain, angina, palpitations, peripheral edema.  Respiratory: Negative for dyspnea at rest, cough, sputum, wheezing.  GI: See history of present illness. Endo: Negative for unusual weight change.  Heme: Negative for bruising  or bleeding.   Physical Exam: BP 123/66 mmHg  Pulse 79  Temp(Src) 98.4 F (36.9 C) (Oral)  Ht 5\' 2"  (1.575 m)  Wt 227 lb 12.8 oz (103.329 kg)  BMI 41.65 kg/m2 General:   Alert and oriented. Pleasant and cooperative. Well-nourished and well-developed.  Head:  Normocephalic and atraumatic. Eyes:  Without icterus, sclera clear and conjunctiva pink.  Ears:  Normal auditory acuity. Mouth:  No deformity or lesions, oral mucosa pink.  Extremities without clubbing or edema. Respiratory:  Clear  to auscultation bilaterally. No wheezes, rales, or rhonchi. No distress.  Gastrointestinal:  +BS, soft, non-tender and non-distended. No HSM noted. No guarding or rebound. No masses appreciated.  Rectal:  Deferred  Neurologic:  Alert and oriented x4;  grossly normal neurologically. Psych:  Alert and cooperative. Normal mood and affect. Heme/Lymph/Immune: No excessive bruising noted.    12/21/2014 10:48 AM

## 2014-12-21 NOTE — Patient Instructions (Signed)
1. We will schedule your procedure for you. 2. Further recommendations to be based on the results of your procedure. 

## 2014-12-21 NOTE — Assessment & Plan Note (Signed)
High-risk patient with a significant family history of colon cancer in her father diagnosed in age of 68s. Last colonoscopy 2008. At last visit she was overdue for colonoscopy and it was scheduled, however her husband had open heart surgery and she was forced to cancel and reschedule. She presents today to reschedule this procedure. She is generally asymptomatic from a GI standpoint. Her previous abdominal pain which she was last seen for has resolved. Red flag/warning signs or symptoms. At this point we will proceed with surveillance colonoscopy in this high risk patient.  Proceed with TCS with Dr. Gala Romney in near future: the risks, benefits, and alternatives have been discussed with the patient in detail. The patient states understanding and desires to proceed.  The patient is not on any anticoagulants other than daily 81 mg aspirin. She is not on any chronic pain meds, anxiolytics, or antidepressants. She denies alcohol and drug use. Conscious sedation should be adequate for her procedure.

## 2014-12-22 ENCOUNTER — Telehealth: Payer: Self-pay | Admitting: Gastroenterology

## 2014-12-22 NOTE — Telephone Encounter (Signed)
PATIENT CALLED STATING THAT COLONOSCOPY PREP IS TOO HIGH.  PLEASE CALL PATIENT TO SEE IF SHE CAN GET ANOTHER KIND OF PREP.  [phone]

## 2014-12-25 ENCOUNTER — Other Ambulatory Visit: Payer: Self-pay

## 2014-12-25 MED ORDER — PEG 3350-KCL-NA BICARB-NACL 420 G PO SOLR: 4000.0000 mL | ORAL | Status: DC

## 2014-12-25 NOTE — Telephone Encounter (Signed)
New prep has been sent in and new instructions in the mail.

## 2015-01-16 ENCOUNTER — Ambulatory Visit (HOSPITAL_COMMUNITY)
Admission: RE | Admit: 2015-01-16 | Discharge: 2015-01-16 | Disposition: A | Discharge status: Home / Self Care | Payer: Medicare Other | Source: Ambulatory Visit | Attending: Internal Medicine | Admitting: Internal Medicine

## 2015-01-16 ENCOUNTER — Encounter (HOSPITAL_COMMUNITY)
Admission: RE | Disposition: A | Discharge status: Home / Self Care | Payer: Self-pay | Source: Ambulatory Visit | Attending: Internal Medicine

## 2015-01-16 ENCOUNTER — Encounter (HOSPITAL_COMMUNITY): Payer: Self-pay | Admitting: *Deleted

## 2015-01-16 DIAGNOSIS — E119 Type 2 diabetes mellitus without complications: Secondary | ICD-10-CM | POA: Insufficient documentation

## 2015-01-16 DIAGNOSIS — Z800 Family history of malignant neoplasm of digestive organs: Secondary | ICD-10-CM

## 2015-01-16 DIAGNOSIS — K635 Polyp of colon: Secondary | ICD-10-CM | POA: Diagnosis not present

## 2015-01-16 DIAGNOSIS — Z860100 Personal history of colon polyps, unspecified: Secondary | ICD-10-CM | POA: Insufficient documentation

## 2015-01-16 DIAGNOSIS — I10 Essential (primary) hypertension: Secondary | ICD-10-CM | POA: Diagnosis not present

## 2015-01-16 DIAGNOSIS — D122 Benign neoplasm of ascending colon: Secondary | ICD-10-CM | POA: Diagnosis not present

## 2015-01-16 DIAGNOSIS — K5730 Diverticulosis of large intestine without perforation or abscess without bleeding: Secondary | ICD-10-CM | POA: Insufficient documentation

## 2015-01-16 DIAGNOSIS — D120 Benign neoplasm of cecum: Secondary | ICD-10-CM | POA: Diagnosis not present

## 2015-01-16 DIAGNOSIS — Z1211 Encounter for screening for malignant neoplasm of colon: Secondary | ICD-10-CM

## 2015-01-16 DIAGNOSIS — Z79899 Other long term (current) drug therapy: Secondary | ICD-10-CM | POA: Diagnosis not present

## 2015-01-16 DIAGNOSIS — D124 Benign neoplasm of descending colon: Secondary | ICD-10-CM | POA: Diagnosis not present

## 2015-01-16 HISTORY — PX: COLONOSCOPY: SHX5424

## 2015-01-16 LAB — GLUCOSE, CAPILLARY: Glucose-Capillary: 120 mg/dL — ABNORMAL HIGH (ref 65–99)

## 2015-01-16 SURGERY — COLONOSCOPY
Anesthesia: Moderate Sedation

## 2015-01-16 MED ORDER — SODIUM CHLORIDE 0.9 % IV SOLN: INTRAVENOUS | Status: DC

## 2015-01-16 MED ORDER — ONDANSETRON HCL 4 MG/2ML IJ SOLN
INTRAMUSCULAR | Status: DC | PRN
  Administered 2015-01-16: 4 mg via INTRAVENOUS

## 2015-01-16 MED ORDER — MIDAZOLAM HCL 5 MG/5ML IJ SOLN
INTRAMUSCULAR | Status: DC | PRN
  Administered 2015-01-16 (×3): 1 mg via INTRAVENOUS
  Administered 2015-01-16 (×2): 2 mg via INTRAVENOUS

## 2015-01-16 MED ORDER — MEPERIDINE HCL 100 MG/ML IJ SOLN
INTRAMUSCULAR | Status: DC | PRN
  Administered 2015-01-16: 25 mg via INTRAVENOUS
  Administered 2015-01-16: 50 mg via INTRAVENOUS

## 2015-01-16 MED ORDER — ONDANSETRON HCL 4 MG/2ML IJ SOLN
INTRAMUSCULAR | Status: AC
  Filled 2015-01-16: qty 2

## 2015-01-16 MED ORDER — MEPERIDINE HCL 100 MG/ML IJ SOLN
INTRAMUSCULAR | Status: AC
  Filled 2015-01-16: qty 2

## 2015-01-16 MED ORDER — MIDAZOLAM HCL 5 MG/5ML IJ SOLN
INTRAMUSCULAR | Status: AC
  Filled 2015-01-16: qty 10

## 2015-01-16 NOTE — Op Note (Signed)
[facility] [address] [place], [address]   COLONOSCOPY PROCEDURE REPORT  PATIENT: Kristine Zamora, Kristine Zamora  MR#: [id number] BIRTHDATE: [birth year] , 62  yrs. old GENDER: female ENDOSCOPIST: R.  Garfield Cornea, MD FACP [facility] REFERRED IR:CVEL Hilma Favors, M.D. PROCEDURE DATE:  Feb 05, 2015 PROCEDURE:   Colonoscopy with snare polypectomy INDICATIONS:   high-risk screening colonoscopy; father with colon cancer at a young age. MEDICATIONS: Versed 7 mg IV and Demerol 75 mg IV in divided doses. Zofran 4 mg IV. ASA CLASS:       Class III  CONSENT: The risks, benefits, alternatives and imponderables including but not limited to bleeding, perforation as well as the possibility of a missed lesion have been reviewed.  The potential for biopsy, lesion removal, etc. have also been discussed. Questions have been answered.  All parties agreeable.  Please see the history and physical in the medical record for more information.  DESCRIPTION OF PROCEDURE:   After the risks benefits and alternatives of the procedure were thoroughly explained, informed consent was obtained.  The digital rectal exam revealed no abnormalities of the rectum.   The EC-[id number] ([id number])  endoscope was introduced through the anus and advanced to the cecum, which was identified by both the appendix and ileocecal valve. No adverse events experienced.   The quality of the prep was adequate  The instrument was then slowly withdrawn as the colon was fully examined. Estimated blood loss is zero unless otherwise noted in this procedure report.      COLON FINDINGS: Normal-appearing rectal mucosa.  Scattered left-sided diverticula; (1) 5 mm polyp in the mid descending segment; patient had a 1 cm flat carpet polyp in the mid ascending segment and (1) 8 mm polyp in the base of the cecum.  The remainder of the colonic mucosa appeared normal.  The descending colon polyp was cold snare removed.  The ascending polyp was  removed completely in piecemeal fashion.  Examine of this site including a retroflexed view in the ascending segment - polyp was removed completely with 3 passes of hot snare cautery.  The polyp in the base of the cecum was removed with hot snare cautery technique.  Retroflexed views revealed no abnormalities. .  Withdrawal time=18 minutes 0 seconds.  The scope was withdrawn and the procedure completed. COMPLICATIONS: There were no immediate complications. EBL 3 mL ENDOSCOPIC IMPRESSION: Colonic diverticulosis. Multiple colonic polyps removed as described above  RECOMMENDATIONS: Follow up on pathology.  eSigned:  R. Garfield Cornea, MD Rosalita Chessman [facility] 2015/02/05 10:28 AM   cc:  CPT CODES: ICD CODES:  The ICD and CPT codes recommended by this software are interpretations from the data that the clinical staff has captured with the software.  The verification of the translation of this report to the ICD and CPT codes and modifiers is the sole responsibility of the health care institution and practicing physician where this report was generated.  [employer] will not be held responsible for the validity of the ICD and CPT codes included on this report.  AMA assumes no liability for data contained or not contained herein. CPT is a Designer, television/film set of the [employer].  PATIENT NAME:  Kristine Zamora, Kristine Zamora MR#: [id number]

## 2015-01-16 NOTE — Discharge Instructions (Signed)
°Colonoscopy °Discharge Instructions ° °Read the instructions outlined below and refer to this sheet in the next few weeks. These discharge instructions provide you with general information on caring for yourself after you leave the hospital. Your doctor may also give you specific instructions. While your treatment has been planned according to the most current medical practices available, unavoidable complications occasionally occur. If you have any problems or questions after discharge, call Dr. Rourk at [phone]. °ACTIVITY °· You may resume your regular activity, but move at a slower pace for the next 24 hours.  °· Take frequent rest periods for the next 24 hours.  °· Walking will help get rid of the air and reduce the bloated feeling in your belly (abdomen).  °· No driving for 24 hours (because of the medicine (anesthesia) used during the test).   °· Do not sign any important legal documents or operate any machinery for 24 hours (because of the anesthesia used during the test).  °NUTRITION °· Drink plenty of fluids.  °· You may resume your normal diet as instructed by your doctor.  °· Begin with a light meal and progress to your normal diet. Heavy or fried foods are harder to digest and may make you feel sick to your stomach (nauseated).  °· Avoid alcoholic beverages for 24 hours or as instructed.  °MEDICATIONS °· You may resume your normal medications unless your doctor tells you otherwise.  °WHAT YOU CAN EXPECT TODAY °· Some feelings of bloating in the abdomen.  °· Passage of more gas than usual.  °· Spotting of blood in your stool or on the toilet paper.  °IF YOU HAD POLYPS REMOVED DURING THE COLONOSCOPY: °· No aspirin products for 7 days or as instructed.  °· No alcohol for 7 days or as instructed.  °· Eat a soft diet for the next 24 hours.  °FINDING OUT THE RESULTS OF YOUR TEST °Not all test results are available during your visit. If your test results are not back during the visit, make an appointment  with your caregiver to find out the results. Do not assume everything is normal if you have not heard from your caregiver or the medical facility. It is important for you to follow up on all of your test results.  °SEEK IMMEDIATE MEDICAL ATTENTION IF: °· You have more than a spotting of blood in your stool.  °· Your belly is swollen (abdominal distention).  °· You are nauseated or vomiting.  °· You have a temperature over 101.  °· You have abdominal pain or discomfort that is severe or gets worse throughout the day.  ° °Diverticulosis and polyp information provided ° °Further recommendations to follow pending review of pathology report ° ° °Diverticulosis °Diverticulosis is the condition that develops when small pouches (diverticula) form in the wall of your colon. Your colon, or large intestine, is where water is absorbed and stool is formed. The pouches form when the inside layer of your colon pushes through weak spots in the outer layers of your colon. °CAUSES  °No one knows exactly what causes diverticulosis. °RISK FACTORS °· Being older than 50. Your risk for this condition increases with age. Diverticulosis is rare in people younger than 40 years. By age 80, almost everyone has it. °· Eating a low-fiber diet. °· Being frequently constipated. °· Being overweight. °· Not getting enough exercise. °· Smoking. °· Taking over-the-counter pain medicines, like aspirin and ibuprofen. °SYMPTOMS  °Most people with diverticulosis do not have symptoms. °DIAGNOSIS  °Because   diverticulosis often has no symptoms, health care providers often discover the condition during an exam for other colon problems. In many cases, a health care provider will diagnose diverticulosis while using a flexible scope to examine the colon (colonoscopy). °TREATMENT  °If you have never developed an infection related to diverticulosis, you may not need treatment. If you have had an infection before, treatment may include: °· Eating more fruits,  vegetables, and grains. °· Taking a fiber supplement. °· Taking a live bacteria supplement (probiotic). °· Taking medicine to relax your colon. °HOME CARE INSTRUCTIONS  °· Drink at least 6-8 glasses of water each day to prevent constipation. °· Try not to strain when you have a bowel movement. °· Keep all follow-up appointments. °If you have had an infection before:  °· Increase the fiber in your diet as directed by your health care provider or dietitian. °· Take a dietary fiber supplement if your health care provider approves. °· Only take medicines as directed by your health care provider. °SEEK MEDICAL CARE IF:  °· You have abdominal pain. °· You have bloating. °· You have cramps. °· You have not gone to the bathroom in 3 days. °SEEK IMMEDIATE MEDICAL CARE IF:  °· Your pain gets worse. °· Your bloating becomes very bad. °· You have a fever or chills, and your symptoms suddenly get worse. °· You begin vomiting. °· You have bowel movements that are bloody or black. °MAKE SURE YOU: °· Understand these instructions. °· Will watch your condition. °· Will get help right away if you are not doing well or get worse. °Document Released: 02/14/2004 Document Revised: 05/24/2013 Document Reviewed: 04/13/2013 °ExitCare® Patient Information ©2015 [employer]. This information is not intended to replace advice given to you by your health care provider. Make sure you discuss any questions you have with your health care provider. ° ° °Colon Polyps °Polyps are lumps of extra tissue growing inside the body. Polyps can grow in the large intestine (colon). Most colon polyps are noncancerous (benign). However, some colon polyps can become cancerous over time. Polyps that are larger than a pea may be harmful. To be safe, caregivers remove and test all polyps. °CAUSES  °Polyps form when mutations in the genes cause your cells to grow and divide even though no more tissue is needed. °RISK FACTORS °There are a number of risk factors  that can increase your chances of getting colon polyps. They include: °· Being older than 50 years. °· Family history of colon polyps or colon cancer. °· Long-term colon diseases, such as colitis or Crohn disease. °· Being overweight. °· Smoking. °· Being inactive. °· Drinking too much alcohol. °SYMPTOMS  °Most small polyps do not cause symptoms. If symptoms are present, they may include: °· Blood in the stool. The stool may look dark red or black. °· Constipation or diarrhea that lasts longer than 1 week. °DIAGNOSIS °People often do not know they have polyps until their caregiver finds them during a regular checkup. Your caregiver can use 4 tests to check for polyps: °· Digital rectal exam. The caregiver wears gloves and feels inside the rectum. This test would find polyps only in the rectum. °· Barium enema. The caregiver puts a liquid called barium into your rectum before taking X-rays of your colon. Barium makes your colon look white. Polyps are dark, so they are easy to see in the X-ray pictures. °· Sigmoidoscopy. A thin, flexible tube (sigmoidoscope) is placed into your rectum. The sigmoidoscope has a light and tiny camera   in it. The caregiver uses the sigmoidoscope to look at the last third of your colon. °· Colonoscopy. This test is like sigmoidoscopy, but the caregiver looks at the entire colon. This is the most common method for finding and removing polyps. °TREATMENT  °Any polyps will be removed during a sigmoidoscopy or colonoscopy. The polyps are then tested for cancer. °PREVENTION  °To help lower your risk of getting more colon polyps: °· Eat plenty of fruits and vegetables. Avoid eating fatty foods. °· Do not smoke. °· Avoid drinking alcohol. °· Exercise every day. °· Lose weight if recommended by your caregiver. °· Eat plenty of calcium and folate. Foods that are rich in calcium include milk, cheese, and broccoli. Foods that are rich in folate include chickpeas, kidney beans, and spinach. °HOME CARE  INSTRUCTIONS °Keep all follow-up appointments as directed by your caregiver. You may need periodic exams to check for polyps. °SEEK MEDICAL CARE IF: °You notice bleeding during a bowel movement. °Document Released: 02/13/2004 Document Revised: 08/11/2011 Document Reviewed: 07/29/2011 °ExitCare® Patient Information ©2015 ExitCare[employer]. This information is not intended to replace advice given to you by your health care provider. Make sure you discuss any questions you have with your health care provider. ° °

## 2015-01-16 NOTE — Interval H&P Note (Signed)
History and Physical Interval Note:  01/16/2015 9:07 AM  Kristine Zamora  has presented today for surgery, with the diagnosis of family history colon cancer  The various methods of treatment have been discussed with the patient and family. After consideration of risks, benefits and other options for treatment, the patient has consented to  Procedure(s) with comments: COLONOSCOPY (N/A) - 0900 as a surgical intervention .  The patient's history has been reviewed, patient examined, no change in status, stable for surgery.  I have reviewed the patient's chart and labs.  Questions were answered to the patient's satisfaction.     Robert Rourk  No change. High-risk screening colonoscopy per plan.  The risks, benefits, limitations, alternatives and imponderables have been reviewed with the patient. Questions have been answered. All parties are agreeable.

## 2015-01-16 NOTE — H&P (View-Only) (Signed)
Referring Provider: Sharilyn Sites, MD Primary Care Physician:  Purvis Kilts, MD Primary GI:  Dr. Gala Romney  Chief Complaint  Patient presents with  . Follow-up    needs tcs    HPI:   69 year old female presents for follow-up and to schedule a colonoscopy. Last seen 10/31/2014 for abdominal pain which at that time had resolved after ultrasound imaging. She was overdue for surveillance colonoscopy due to high risk family history of colon cancer age greater than 48. Last colonoscopy in 2008 with a single diminutive polyp which was hyperplastic. She was scheduled for her colonoscopy however she had to cancel due to her husband bleeding open heart surgery. Review of abdominal ultrasound completed 10/02/2014 shows normal gallbladder and common bile duct. Liver was noted to have a cyst measuring 5.5 x 5.8 x 3.8 cm an increase echogenicity consistent with hepatic steatosis. Last ultrasound done in 2003 shows similar benign-appearing cyst measuring 4.1 x 4.2 x 2.7 cm. This represents minimal enlargement over the past 13 years.  Today she states she is not having abdominal pain currently. Deneis N/V, hematochezia, melena, unintentional weight loss, unexplained fevers. Her husband had CABG 1.5 months ago and states she's handling the stress very well. Denies chest pain, dyspnea, dizziness, lightheadedness, syncope, near syncope. Denies any other upper or lower GI symptoms.  Past Medical History  Diagnosis Date  . Diabetes mellitus without complication   . Hypertension   . Back pain   . Asthma   . Lichen planus atrophicus     Past Surgical History  Procedure Laterality Date  . Colonoscopy  2008    Dr. Gala Romney: pancolonic diverticula, hyperplastic rectosigmoid polyp    Current Outpatient Prescriptions  Medication Sig Dispense Refill  . aspirin 81 MG tablet Take 81 mg by mouth daily.    . clobetasol cream (TEMOVATE) 8.93 % Apply 1 application topically 2 (two) times daily. 30 g 11  .  lisinopril (PRINIVIL,ZESTRIL) 10 MG tablet Take 10 mg by mouth daily.    . metFORMIN (GLUMETZA) 1000 MG (MOD) 24 hr tablet Take 1,000 mg by mouth 2 (two) times daily with a meal.     . pravastatin (PRAVACHOL) 20 MG tablet Take 20 mg by mouth daily.     No current facility-administered medications for this visit.    Allergies as of 12/21/2014 - Review Complete 12/21/2014  Allergen Reaction Noted  . Peach [prunus persica] Anaphylaxis 11/08/2013    Family History  Problem Relation Age of Onset  . Alcohol abuse Mother     old age  . Colon cancer Father     diagnosed in his early 71s  . Breast cancer Daughter     History   Social History  . Marital Status: Married    Spouse Name: N/A  . Number of Children: N/A  . Years of Education: N/A   Social History Main Topics  . Smoking status: Former Research scientist (life sciences)  . Smokeless tobacco: Not on file  . Alcohol Use: No  . Drug Use: No  . Sexual Activity: No   Other Topics Concern  . None   Social History Narrative    Review of Systems: General: Negative for anorexia, weight loss, fever, chills, fatigue, weakness. Eyes: Negative for vision changes.  ENT: Negative for hoarseness, difficulty swallowing. CV: Negative for chest pain, angina, palpitations, peripheral edema.  Respiratory: Negative for dyspnea at rest, cough, sputum, wheezing.  GI: See history of present illness. Endo: Negative for unusual weight change.  Heme: Negative for bruising  or bleeding.   Physical Exam: BP 123/66 mmHg  Pulse 79  Temp(Src) 98.4 F (36.9 C) (Oral)  Ht 5\' 2"  (1.575 m)  Wt 227 lb 12.8 oz (103.329 kg)  BMI 41.65 kg/m2 General:   Alert and oriented. Pleasant and cooperative. Well-nourished and well-developed.  Head:  Normocephalic and atraumatic. Eyes:  Without icterus, sclera clear and conjunctiva pink.  Ears:  Normal auditory acuity. Mouth:  No deformity or lesions, oral mucosa pink.  Extremities without clubbing or edema. Respiratory:  Clear  to auscultation bilaterally. No wheezes, rales, or rhonchi. No distress.  Gastrointestinal:  +BS, soft, non-tender and non-distended. No HSM noted. No guarding or rebound. No masses appreciated.  Rectal:  Deferred  Neurologic:  Alert and oriented x4;  grossly normal neurologically. Psych:  Alert and cooperative. Normal mood and affect. Heme/Lymph/Immune: No excessive bruising noted.    12/21/2014 10:48 AM

## 2015-01-18 ENCOUNTER — Encounter: Payer: Self-pay | Admitting: Internal Medicine

## 2015-01-19 ENCOUNTER — Encounter (HOSPITAL_COMMUNITY): Payer: Self-pay | Admitting: Internal Medicine

## 2015-02-20 NOTE — Progress Notes (Signed)
If patient is willing, I think the best thing to do to lay the liver cysts issue to rest is to have an MRI of the liver with [facility].

## 2015-02-21 ENCOUNTER — Other Ambulatory Visit: Payer: Self-pay

## 2015-02-21 DIAGNOSIS — K7689 Other specified diseases of liver: Secondary | ICD-10-CM

## 2015-02-21 NOTE — Progress Notes (Signed)
MRI was sent to clinical review. Insurance is pending

## 2015-03-01 NOTE — Progress Notes (Signed)
Pt is aware of appt. For MRI

## 2015-03-01 NOTE — Progress Notes (Signed)
PA # for MRI is [id number]

## 2015-03-14 ENCOUNTER — Other Ambulatory Visit (HOSPITAL_COMMUNITY): Payer: Self-pay | Admitting: Family Medicine

## 2015-03-14 DIAGNOSIS — Z1231 Encounter for screening mammogram for malignant neoplasm of breast: Secondary | ICD-10-CM

## 2015-03-16 ENCOUNTER — Ambulatory Visit (HOSPITAL_COMMUNITY)
Admission: RE | Admit: 2015-03-16 | Discharge: 2015-03-16 | Disposition: A | Discharge status: Home / Self Care | Payer: Medicare Other | Source: Ambulatory Visit | Attending: Family Medicine | Admitting: Family Medicine

## 2015-03-16 ENCOUNTER — Other Ambulatory Visit: Payer: Self-pay | Admitting: Gastroenterology

## 2015-03-16 ENCOUNTER — Ambulatory Visit (HOSPITAL_COMMUNITY)
Admission: RE | Admit: 2015-03-16 | Discharge: 2015-03-16 | Disposition: A | Discharge status: Home / Self Care | Payer: Medicare Other | Source: Ambulatory Visit | Attending: Gastroenterology | Admitting: Gastroenterology

## 2015-03-16 DIAGNOSIS — K7689 Other specified diseases of liver: Secondary | ICD-10-CM | POA: Insufficient documentation

## 2015-03-16 DIAGNOSIS — Z1231 Encounter for screening mammogram for malignant neoplasm of breast: Secondary | ICD-10-CM | POA: Insufficient documentation

## 2015-03-16 LAB — POCT I-STAT CREATININE: Creatinine, Ser: 0.6 mg/dL (ref 0.44–1.00)

## 2015-03-16 MED ORDER — SODIUM CHLORIDE 0.9 % IJ SOLN
INTRAMUSCULAR | Status: AC
  Filled 2015-03-16: qty 15

## 2015-03-16 MED ORDER — SODIUM CHLORIDE 0.9 % IV SOLN
INTRAVENOUS | Status: AC
  Filled 2015-03-16: qty 150

## 2015-03-19 ENCOUNTER — Telehealth: Payer: Self-pay

## 2015-03-19 NOTE — Telephone Encounter (Signed)
Pt is calling about the MRI that she had done. She is wanting to know why she had to have it done. Please advise

## 2015-03-19 NOTE — Telephone Encounter (Signed)
Pt understands but she could not do the MRI at [facility] because she could not stay inside that long. She will need an open MRI done.

## 2015-03-19 NOTE — Telephone Encounter (Signed)
Her ultrasound showed cysts in her liver which appear to have been there for some time. It appears Vicente Males discussed the results with Dr. Thornton Papas (Radiologist) and it was determined the best thing to do would be an MRI to ensure they're just simple cysts and nothing that needs to be looked at further.

## 2015-03-20 NOTE — Telephone Encounter (Signed)
Con we find an open MRI that can see her? Thanks. Let me know if any issues.

## 2015-03-22 DIAGNOSIS — E782 Mixed hyperlipidemia: Secondary | ICD-10-CM | POA: Diagnosis not present

## 2015-03-22 DIAGNOSIS — Z23 Encounter for immunization: Secondary | ICD-10-CM | POA: Diagnosis not present

## 2015-03-22 DIAGNOSIS — Z1389 Encounter for screening for other disorder: Secondary | ICD-10-CM | POA: Diagnosis not present

## 2015-03-22 DIAGNOSIS — I10 Essential (primary) hypertension: Secondary | ICD-10-CM | POA: Diagnosis not present

## 2015-03-22 DIAGNOSIS — E1165 Type 2 diabetes mellitus with hyperglycemia: Secondary | ICD-10-CM | POA: Diagnosis not present

## 2015-03-22 DIAGNOSIS — Z6839 Body mass index (BMI) 39.0-39.9, adult: Secondary | ICD-10-CM | POA: Diagnosis not present

## 2015-03-22 NOTE — Telephone Encounter (Signed)
Left message with husband to call us back

## 2015-03-22 NOTE — Telephone Encounter (Signed)
Pt is aware of appointment on 04/11/15 @ 9:00

## 2015-04-11 ENCOUNTER — Ambulatory Visit
Admission: RE | Admit: 2015-04-11 | Discharge: 2015-04-11 | Disposition: A | Discharge status: Home / Self Care | Payer: Medicare Other | Source: Ambulatory Visit | Attending: Gastroenterology | Admitting: Gastroenterology

## 2015-04-11 DIAGNOSIS — R933 Abnormal findings on diagnostic imaging of other parts of digestive tract: Secondary | ICD-10-CM | POA: Diagnosis not present

## 2015-04-11 DIAGNOSIS — K7689 Other specified diseases of liver: Secondary | ICD-10-CM | POA: Diagnosis not present

## 2015-04-11 MED ORDER — GADOXETATE DISODIUM 0.25 MMOL/ML IV SOLN
10.0000 mL | Freq: Once | INTRAVENOUS | Status: AC | PRN
  Administered 2015-04-11: 10 mL via INTRAVENOUS

## 2015-04-12 ENCOUNTER — Other Ambulatory Visit: Payer: Self-pay

## 2015-04-12 DIAGNOSIS — K7689 Other specified diseases of liver: Secondary | ICD-10-CM

## 2015-04-12 NOTE — Progress Notes (Signed)
Quick Note:  1.4 cm enhancing lesion in anterior right upper kidney. THIS IS CONCERNING FOR MALIGNANCY.  NEEDS TO BE REFERRED TO ONCOLOGY ASAP. They can decide best way to work this up.  Please let her know that she has stable liver cysts, benign. ______

## 2015-04-13 ENCOUNTER — Other Ambulatory Visit: Payer: Self-pay

## 2015-04-13 DIAGNOSIS — N281 Cyst of kidney, acquired: Secondary | ICD-10-CM

## 2015-06-13 ENCOUNTER — Ambulatory Visit (INDEPENDENT_AMBULATORY_CARE_PROVIDER_SITE_OTHER): Payer: Medicare Other | Admitting: Urology

## 2015-06-13 DIAGNOSIS — N289 Disorder of kidney and ureter, unspecified: Secondary | ICD-10-CM | POA: Diagnosis not present

## 2015-07-25 ENCOUNTER — Other Ambulatory Visit: Payer: Self-pay | Admitting: Urology

## 2015-07-25 DIAGNOSIS — N2889 Other specified disorders of kidney and ureter: Secondary | ICD-10-CM

## 2015-08-01 DIAGNOSIS — I10 Essential (primary) hypertension: Secondary | ICD-10-CM | POA: Diagnosis not present

## 2015-08-01 DIAGNOSIS — J019 Acute sinusitis, unspecified: Secondary | ICD-10-CM | POA: Diagnosis not present

## 2015-08-01 DIAGNOSIS — E119 Type 2 diabetes mellitus without complications: Secondary | ICD-10-CM | POA: Diagnosis not present

## 2015-08-01 DIAGNOSIS — L0100 Impetigo, unspecified: Secondary | ICD-10-CM | POA: Diagnosis not present

## 2015-08-01 DIAGNOSIS — Z1389 Encounter for screening for other disorder: Secondary | ICD-10-CM | POA: Diagnosis not present

## 2015-08-01 DIAGNOSIS — M1991 Primary osteoarthritis, unspecified site: Secondary | ICD-10-CM | POA: Diagnosis not present

## 2015-08-01 DIAGNOSIS — E782 Mixed hyperlipidemia: Secondary | ICD-10-CM | POA: Diagnosis not present

## 2015-10-18 ENCOUNTER — Emergency Department (HOSPITAL_COMMUNITY)
Admission: EM | Admit: 2015-10-18 | Discharge: 2015-10-18 | Disposition: A | Discharge status: Home / Self Care | Payer: Medicare Other | Attending: Emergency Medicine | Admitting: Emergency Medicine

## 2015-10-18 ENCOUNTER — Emergency Department (HOSPITAL_COMMUNITY): Payer: Medicare Other

## 2015-10-18 ENCOUNTER — Encounter (HOSPITAL_COMMUNITY): Payer: Self-pay

## 2015-10-18 DIAGNOSIS — W08XXXA Fall from other furniture, initial encounter: Secondary | ICD-10-CM | POA: Insufficient documentation

## 2015-10-18 DIAGNOSIS — Z79899 Other long term (current) drug therapy: Secondary | ICD-10-CM | POA: Insufficient documentation

## 2015-10-18 DIAGNOSIS — T07XXXA Unspecified multiple injuries, initial encounter: Secondary | ICD-10-CM

## 2015-10-18 DIAGNOSIS — Z7984 Long term (current) use of oral hypoglycemic drugs: Secondary | ICD-10-CM | POA: Insufficient documentation

## 2015-10-18 DIAGNOSIS — S92351A Displaced fracture of fifth metatarsal bone, right foot, initial encounter for closed fracture: Secondary | ICD-10-CM | POA: Diagnosis not present

## 2015-10-18 DIAGNOSIS — Y999 Unspecified external cause status: Secondary | ICD-10-CM | POA: Insufficient documentation

## 2015-10-18 DIAGNOSIS — S92341A Displaced fracture of fourth metatarsal bone, right foot, initial encounter for closed fracture: Secondary | ICD-10-CM | POA: Diagnosis not present

## 2015-10-18 DIAGNOSIS — S70312A Abrasion, left thigh, initial encounter: Secondary | ICD-10-CM | POA: Insufficient documentation

## 2015-10-18 DIAGNOSIS — Z7982 Long term (current) use of aspirin: Secondary | ICD-10-CM | POA: Diagnosis not present

## 2015-10-18 DIAGNOSIS — Y939 Activity, unspecified: Secondary | ICD-10-CM | POA: Diagnosis not present

## 2015-10-18 DIAGNOSIS — Z23 Encounter for immunization: Secondary | ICD-10-CM | POA: Insufficient documentation

## 2015-10-18 DIAGNOSIS — E119 Type 2 diabetes mellitus without complications: Secondary | ICD-10-CM | POA: Insufficient documentation

## 2015-10-18 DIAGNOSIS — S8264XA Nondisplaced fracture of lateral malleolus of right fibula, initial encounter for closed fracture: Secondary | ICD-10-CM | POA: Diagnosis not present

## 2015-10-18 DIAGNOSIS — Y929 Unspecified place or not applicable: Secondary | ICD-10-CM | POA: Diagnosis not present

## 2015-10-18 DIAGNOSIS — Z87891 Personal history of nicotine dependence: Secondary | ICD-10-CM | POA: Diagnosis not present

## 2015-10-18 DIAGNOSIS — S8991XA Unspecified injury of right lower leg, initial encounter: Secondary | ICD-10-CM | POA: Diagnosis not present

## 2015-10-18 DIAGNOSIS — M79661 Pain in right lower leg: Secondary | ICD-10-CM | POA: Diagnosis not present

## 2015-10-18 DIAGNOSIS — S80811A Abrasion, right lower leg, initial encounter: Secondary | ICD-10-CM | POA: Diagnosis not present

## 2015-10-18 DIAGNOSIS — I10 Essential (primary) hypertension: Secondary | ICD-10-CM | POA: Diagnosis not present

## 2015-10-18 DIAGNOSIS — J45909 Unspecified asthma, uncomplicated: Secondary | ICD-10-CM | POA: Diagnosis not present

## 2015-10-18 DIAGNOSIS — S99911A Unspecified injury of right ankle, initial encounter: Secondary | ICD-10-CM | POA: Diagnosis present

## 2015-10-18 DIAGNOSIS — S92301A Fracture of unspecified metatarsal bone(s), right foot, initial encounter for closed fracture: Secondary | ICD-10-CM

## 2015-10-18 DIAGNOSIS — S8261XA Displaced fracture of lateral malleolus of right fibula, initial encounter for closed fracture: Secondary | ICD-10-CM

## 2015-10-18 MED ORDER — HYDROCODONE-ACETAMINOPHEN 5-325 MG PO TABS: 1.0000 | ORAL_TABLET | ORAL | Status: DC

## 2015-10-18 MED ORDER — HYDROCODONE-ACETAMINOPHEN 5-325 MG PO TABS: 1.0000 | ORAL_TABLET | Freq: Four times a day (QID) | ORAL | Status: DC | PRN

## 2015-10-18 MED ORDER — BACITRACIN ZINC 500 UNIT/GM EX OINT
1.0000 "application " | TOPICAL_OINTMENT | Freq: Once | CUTANEOUS | Status: AC
  Administered 2015-10-18: 1 via TOPICAL
  Filled 2015-10-18: qty 0.9

## 2015-10-18 MED ORDER — TETANUS-DIPHTH-ACELL PERTUSSIS 5-2.5-18.5 LF-MCG/0.5 IM SUSP
0.5000 mL | Freq: Once | INTRAMUSCULAR | Status: AC
  Administered 2015-10-18: 0.5 mL via INTRAMUSCULAR
  Filled 2015-10-18: qty 0.5

## 2015-10-18 NOTE — ED Notes (Signed)
Portable xray at bedside.

## 2015-10-18 NOTE — ED Notes (Signed)
Patient with no complaints at this time. Respirations even and unlabored. Skin warm/dry. Discharge instructions reviewed with patient at this time. Patient given opportunity to voice concerns/ask questions. Patient discharged at this time and left Emergency Department with steady gait.   

## 2015-10-18 NOTE — ED Provider Notes (Signed)
CSN: [id number]     Arrival date & time 10/18/15  1552 History   First MD Initiated Contact with Patient 10/18/15 1632     Chief Complaint  Patient presents with  . Ankle Pain  . Extremity Laceration   HPI Patient presented to the emergency room for evaluation of an ankle and foot injury after falling through a wooden deck. Patient was walking on the deck gave way. She scratched her left thigh and twisted and injured her right ankle and foot. She had a lot of pain bearing weight on her foot. She felt something pop. She denies any distal numbness or weakness. She denies any other injuries including head, neck, back, chest or abdomen. Past Medical History  Diagnosis Date  . Diabetes mellitus without complication ([place])   . Hypertension   . Back pain   . Asthma   . Lichen planus atrophicus    Past Surgical History  Procedure Laterality Date  . Colonoscopy  2008    Dr. Gala Romney: pancolonic diverticula, hyperplastic rectosigmoid polyp  . Colonoscopy N/A 01/16/2015    Procedure: COLONOSCOPY;  Surgeon: Daneil Dolin, MD;  Location: [facility];  Service: Endoscopy;  Laterality: N/A;  0900   Family History  Problem Relation Age of Onset  . Alcohol abuse Mother     old age  . Colon cancer Father     diagnosed in his early 25s  . Breast cancer Daughter    Social History  Substance Use Topics  . Smoking status: Former Smoker    Quit date: 12/21/1994  . Smokeless tobacco: Never Used  . Alcohol Use: No     Comment: quit drinking about 1996   OB History    No data available     Review of Systems  All other systems reviewed and are negative.     Allergies  [place]  Home Medications   Prior to Admission medications   Medication Sig Start Date End Date Taking? Authorizing Provider  aspirin 81 MG tablet Take 81 mg by mouth daily.    Historical Provider, MD  clobetasol cream (TEMOVATE) 0.35 % Apply 1 application topically 2 (two) times daily. 11/08/13   Florian Buff, MD    HYDROcodone-acetaminophen (NORCO/VICODIN) 5-325 MG tablet Take 1 tablet by mouth every 6 (six) hours as needed. 10/18/15   Dorie Rank, MD  lisinopril (PRINIVIL,ZESTRIL) 10 MG tablet Take 10 mg by mouth daily.    Historical Provider, MD  metFORMIN (GLUMETZA) 1000 MG (MOD) 24 hr tablet Take 1,000 mg by mouth 2 (two) times daily with a meal.     Historical Provider, MD  Na Sulfate-K Sulfate-Mg Sulf (SUPREP BOWEL PREP) SOLN Take 1 kit by mouth as directed. 12/21/14   Daneil Dolin, MD  polyethylene glycol-electrolytes (TRILYTE) 420 G solution Take 4,000 mLs by mouth as directed. 12/25/14   Daneil Dolin, MD  pravastatin (PRAVACHOL) 20 MG tablet Take 20 mg by mouth daily.    Historical Provider, MD   BP 118/59 mmHg  Pulse 98  Temp(Src) 96.5 F (35.8 C) (Temporal)  Resp 22  Ht '5\' 2"'$  (1.575 m)  Wt 102.967 kg  BMI 41.51 kg/m2  SpO2 98% Physical Exam  Constitutional: She appears well-developed and well-nourished. No distress.  HENT:  Head: Normocephalic and atraumatic.  Right Ear: External ear normal.  Left Ear: External ear normal.  Eyes: Conjunctivae are normal. Right eye exhibits no discharge. Left eye exhibits no discharge. No scleral icterus.  Neck: Neck supple. No tracheal  deviation present.  Cardiovascular: Normal rate.   Pulmonary/Chest: Effort normal. No stridor. No respiratory distress.  Musculoskeletal: She exhibits no edema.       Right hip: Normal.       Right knee: Normal.       Right ankle: Tenderness.       Cervical back: Normal.       Thoracic back: Normal.       Lumbar back: Normal.       Legs:      Right foot: There is tenderness.  No tenderness palpation left knee and left ankle, superficial scratches of the left thigh  Neurological: She is alert. Cranial nerve deficit: no gross deficits.  Skin: Skin is warm and dry. No rash noted.  Psychiatric: She has a normal mood and affect.  Nursing note and vitals reviewed.   ED Course  Procedures (including critical care  time) Labs Review Labs Reviewed - No data to display  Imaging Review Dg Tibia/fibula Right  10/18/2015  CLINICAL DATA:  The patient fell through a wooden dock today. Right lower leg and foot pain. EXAM: RIGHT TIBIA AND FIBULA - 2 VIEW COMPARISON:  None. FINDINGS: There is no evidence of fracture or other focal bone lesions. Soft tissues are unremarkable. IMPRESSION: Negative exam. Electronically Signed   By: Inge Rise M.D.   On: 10/18/2015 16:26   Dg Ankle Complete Right  10/18/2015  CLINICAL DATA:  Status post fall through a wooden dock today with a right ankle injury. Pain. Initial encounter. EXAM: RIGHT ANKLE - COMPLETE 3+ VIEW COMPARISON:  None. FINDINGS: Acute fractures are seen in the diaphysis of both the fourth and fifth metatarsals. The patient also has a nondisplaced fracture off the tip of the lateral malleolus with associated soft tissue swelling. No other fracture is identified. IMPRESSION: Nondisplaced fracture tip of the lateral malleolus. Partial visualization of fourth and fifth metatarsal fractures. Recommend plain films of the right foot. Electronically Signed   By: Inge Rise M.D.   On: 10/18/2015 16:24   Dg Foot Complete Right  10/18/2015  CLINICAL DATA:  70 year old female who fell through deck today with pain and swelling. Initial encounter. EXAM: RIGHT FOOT COMPLETE - 3+ VIEW COMPARISON:  Right ankle series from today reported separately. FINDINGS: Nondisplaced spiral fracture through the proximal shaft of the right fourth metatarsal. There is a comminuted spiral fracture of the distal fifth metatarsal with mild displacement of fragments including mild medial displacement of the distal fragment. Both of these appear to be extra-articular. The first through third metatarsals appear intact. Calcaneus appears intact. No tarsal bone fracture identified. No acute phalanx fracture, chronic/healed fracture of the fifth proximal phalanx. Widespread soft tissue swelling.  No  subcutaneous gas. IMPRESSION: Extra-articular comminuted spiral fractures of the fourth and fifth metatarsals, the latter is mildly displaced. Electronically Signed   By: Genevie Ann M.D.   On: 10/18/2015 17:20   I have personally reviewed and evaluated these images and lab results as part of my medical decision-making.    MDM   Final diagnoses:  Metatarsal fracture, right, closed, initial encounter  Lateral malleolar fracture, right, closed, initial encounter  Multiple abrasions    Patient's wounds were irrigated and cleaned. Her tetanus was updated.  Right ankle and foot were placed on a posterior splint with stirrup. Patient was provided crutches. Outpatient follow-up with orthopedics.  Dorie Rank, MD 10/18/15 2218

## 2015-10-18 NOTE — Discharge Instructions (Signed)
Ankle Fracture A fracture is a break in a bone. The ankle joint is made up of three bones. These include the lower (distal)sections of your lower leg bones, called the tibia and fibula, along with a bone in your foot, called the talus. Depending on how bad the break is and if more than one ankle joint bone is broken, a cast or splint is used to protect and keep your injured bone from moving while it heals. Sometimes, surgery is required to help the fracture heal properly.  There are two general types of fractures:  Stable fracture. This includes a single fracture line through one bone, with no injury to ankle ligaments. A fracture of the talus that does not have any displacement (movement of the bone on either side of the fracture line) is also stable.  Unstable fracture. This includes more than one fracture line through one or more bones in the ankle joint. It also includes fractures that have displacement of the bone on either side of the fracture line. CAUSES  A direct blow to the ankle.   Quickly and severely twisting your ankle.  Trauma, such as a car accident or falling from a significant height. RISK FACTORS You may be at a higher risk of ankle fracture if:  You have certain medical conditions.  You are involved in high-impact sports.  You are involved in a high-impact car accident. SIGNS AND SYMPTOMS   Tender and swollen ankle.  Bruising around the injured ankle.  Pain on movement of the ankle.  Difficulty walking or putting weight on the ankle.  A cold foot below the site of the ankle injury. This can occur if the blood vessels passing through your injured ankle were also damaged.  Numbness in the foot below the site of the ankle injury. DIAGNOSIS  An ankle fracture is usually diagnosed with a physical exam and X-rays. A CT scan may also be required for complex fractures. TREATMENT  Stable fractures are treated with a cast or splint and using crutches to avoid putting  weight on your injured ankle. This is followed by an ankle strengthening program. Some patients require a special type of cast, depending on other medical problems they may have. Unstable fractures require surgery to ensure the bones heal properly. Your health care provider will tell you what type of fracture you have and the best treatment for your condition. HOME CARE INSTRUCTIONS   Review correct crutch use with your health care provider and use your crutches as directed. Safe use of crutches is extremely important. Misuse of crutches can cause you to fall or cause injury to nerves in your hands or armpits.  Do not put weight or pressure on the injured ankle until directed by your health care provider.  To lessen the swelling, keep the injured leg elevated while sitting or lying down.  Apply ice to the injured area:  Put ice in a plastic bag.  Place a towel between your cast and the bag.  Leave the ice on for 20 minutes, 2-3 times a day.  If you have a plaster or fiberglass cast:  Do not try to scratch the skin under the cast with any objects. This can increase your risk of skin infection.  Check the skin around the cast every day. You may put lotion on any red or sore areas.  Keep your cast dry and clean.  If you have a plaster splint:  Wear the splint as directed.  You may loosen the elastic  around the splint if your toes become numb, tingle, or turn cold or blue.  Do not put pressure on any part of your cast or splint; it may break. Rest your cast only on a pillow the first 24 hours until it is fully hardened.  Your cast or splint can be protected during bathing with a plastic bag sealed to your skin with medical tape. Do not lower the cast or splint into water.  Take medicines as directed by your health care provider. Only take over-the-counter or prescription medicines for pain, discomfort, or fever as directed by your health care provider.  Do not drive a vehicle until  your health care provider specifically tells you it is safe to do so.  If your health care provider has given you a follow-up appointment, it is very important to keep that appointment. Not keeping the appointment could result in a chronic or permanent injury, pain, and disability. If you have any problem keeping the appointment, call the facility for assistance. SEEK MEDICAL CARE IF: You develop increased swelling or discomfort. SEEK IMMEDIATE MEDICAL CARE IF:   Your cast gets damaged or breaks.  You have continued severe pain.  You develop new pain or swelling after the cast was put on.  Your skin or toenails below the injury turn blue or gray.  Your skin or toenails below the injury feel cold, numb, or have loss of sensitivity to touch.  There is a bad smell or pus draining from under the cast. MAKE SURE YOU:   Understand these instructions.  Will watch your condition.  Will get help right away if you are not doing well or get worse.   This information is not intended to replace advice given to you by your health care provider. Make sure you discuss any questions you have with your health care provider.   Document Released: 05/16/2000 Document Revised: 05/24/2013 Document Reviewed: 12/16/2012 Elsevier Interactive Patient Education 2016 [employer] Metatarsal Fracture A metatarsal fracture is a break in a metatarsal bone. Metatarsal bones connect your toe bones to your ankle bones. CAUSES This type of fracture may be caused by:  A sudden twisting of your foot.  A fall onto your foot.  Overuse or repetitive exercise. RISK FACTORS This condition is more likely to develop in people who:  Play contact sports.  Have a bone disease.  Have a low calcium level. SYMPTOMS Symptoms of this condition include:  Pain that is worse when walking or standing.  Pain when pressing on the foot or moving the toes.  Swelling.  Bruising on the top or bottom of the foot.  A  foot that appears shorter than the other one. DIAGNOSIS This condition is diagnosed with a physical exam. You may also have imaging tests, such as:  X-rays.  A CT scan.  MRI. TREATMENT Treatment for this condition depends on its severity and whether a bone has moved out of place. Treatment may involve:  Rest.  Wearing foot support such as a cast, splint, or boot for several weeks.  Using crutches.  Surgery to move bones back into the right position. Surgery is usually needed if there are many pieces of broken bone or bones that are very out of place (displaced fracture).  Physical therapy. This may be needed to help you regain full movement and strength in your foot. You will need to return to your health care provider to have X-rays taken until your bones heal. Your health care provider will look at  the X-rays to make sure that your foot is healing well. HOME CARE INSTRUCTIONS  If You Have a Cast:  Do not stick anything inside the cast to scratch your skin. Doing that increases your risk of infection.  Check the skin around the cast every day. Report any concerns to your health care provider. You may put lotion on dry skin around the edges of the cast. Do not apply lotion to the skin underneath the cast.  Keep the cast clean and dry. If You Have a Splint or a Supportive Boot:  Wear it as directed by your health care provider. Remove it only as directed by your health care provider.  Loosen it if your toes become numb and tingle, or if they turn cold and blue.  Keep it clean and dry. Bathing  Do not take baths, swim, or use a hot tub until your health care provider approves. Ask your health care provider if you can take showers. You may only be allowed to take sponge baths for bathing.  If your health care provider approves bathing and showering, cover the cast or splint with a watertight plastic bag to protect it from water. Do not let the cast or splint get wet. Managing  Pain, Stiffness, and Swelling  If directed, apply ice to the injured area (if you have a splint, not a cast).  Put ice in a plastic bag.  Place a towel between your skin and the bag.  Leave the ice on for 20 minutes, 2-3 times per day.  Move your toes often to avoid stiffness and to lessen swelling.  Raise (elevate) the injured area above the level of your heart while you are sitting or lying down. Driving  Do not drive or operate heavy machinery while taking pain medicine.  Do not drive while wearing foot support on a foot that you use for driving. Activity  Return to your normal activities as directed by your health care provider. Ask your health care provider what activities are safe for you.  Perform exercises as directed by your health care provider or physical therapist. Safety  Do not use the injured foot to support your body weight until your health care provider says that you can. Use crutches as directed by your health care provider. General Instructions  Do not put pressure on any part of the cast or splint until it is fully hardened. This may take several hours.  Do not use any tobacco products, including cigarettes, chewing tobacco, or e-cigarettes. Tobacco can delay bone healing. If you need help quitting, ask your health care provider.  Take medicines only as directed by your health care provider.  Keep all follow-up visits as directed by your health care provider. This is important. SEEK MEDICAL CARE IF:  You have a fever.  Your cast, splint, or boot is too loose or too tight.  Your cast, splint, or boot is damaged.  Your pain medicine is not helping.  You have pain, tingling, or numbness in your foot that is not going away. SEEK IMMEDIATE MEDICAL CARE IF:  You have severe pain.  You have tingling or numbness in your foot that is getting worse.  Your foot feels cold or becomes numb.  Your foot changes color.   This information is not intended to  replace advice given to you by your health care provider. Make sure you discuss any questions you have with your health care provider.   Document Released: 02/08/2002 Document Revised: 10/03/2014 Document Reviewed:  03/15/2014 Elsevier Interactive Patient Education [employer].

## 2015-10-18 NOTE — ED Notes (Addendum)
Pt fell through a wooden dock and injured right ankle and complaining of pain lower lateral leg. Noted to swollen.  Pt has multiple scrapes on left upper thigh.

## 2015-10-18 NOTE — ED Notes (Signed)
Left thigh cleaned with normal saline. Pt tolerated well. One splinter noted on left lateral knee. Splinter removed during cleaning. No other splinters noted. Minimal bleeding and irritation noted to abrasion sites. nad noted.

## 2015-10-24 DIAGNOSIS — S8261XA Displaced fracture of lateral malleolus of right fibula, initial encounter for closed fracture: Secondary | ICD-10-CM | POA: Diagnosis not present

## 2015-10-24 DIAGNOSIS — S92351A Displaced fracture of fifth metatarsal bone, right foot, initial encounter for closed fracture: Secondary | ICD-10-CM | POA: Diagnosis not present

## 2015-10-24 DIAGNOSIS — S92344A Nondisplaced fracture of fourth metatarsal bone, right foot, initial encounter for closed fracture: Secondary | ICD-10-CM | POA: Diagnosis not present

## 2015-11-26 DIAGNOSIS — S8261XD Displaced fracture of lateral malleolus of right fibula, subsequent encounter for closed fracture with routine healing: Secondary | ICD-10-CM | POA: Diagnosis not present

## 2015-11-26 DIAGNOSIS — S92344D Nondisplaced fracture of fourth metatarsal bone, right foot, subsequent encounter for fracture with routine healing: Secondary | ICD-10-CM | POA: Diagnosis not present

## 2015-11-26 DIAGNOSIS — S92351D Displaced fracture of fifth metatarsal bone, right foot, subsequent encounter for fracture with routine healing: Secondary | ICD-10-CM | POA: Diagnosis not present

## 2015-12-11 DIAGNOSIS — Z1389 Encounter for screening for other disorder: Secondary | ICD-10-CM | POA: Diagnosis not present

## 2015-12-11 DIAGNOSIS — N760 Acute vaginitis: Secondary | ICD-10-CM | POA: Diagnosis not present

## 2015-12-11 DIAGNOSIS — E119 Type 2 diabetes mellitus without complications: Secondary | ICD-10-CM | POA: Diagnosis not present

## 2015-12-11 DIAGNOSIS — J4520 Mild intermittent asthma, uncomplicated: Secondary | ICD-10-CM | POA: Diagnosis not present

## 2015-12-12 ENCOUNTER — Ambulatory Visit: Payer: Medicare Other | Admitting: Urology

## 2016-01-11 DIAGNOSIS — S92344D Nondisplaced fracture of fourth metatarsal bone, right foot, subsequent encounter for fracture with routine healing: Secondary | ICD-10-CM | POA: Diagnosis not present

## 2016-01-11 DIAGNOSIS — S92351D Displaced fracture of fifth metatarsal bone, right foot, subsequent encounter for fracture with routine healing: Secondary | ICD-10-CM | POA: Diagnosis not present

## 2016-01-11 DIAGNOSIS — S8261XD Displaced fracture of lateral malleolus of right fibula, subsequent encounter for closed fracture with routine healing: Secondary | ICD-10-CM | POA: Diagnosis not present

## 2016-03-13 DIAGNOSIS — E1165 Type 2 diabetes mellitus with hyperglycemia: Secondary | ICD-10-CM | POA: Diagnosis not present

## 2016-03-13 DIAGNOSIS — Z1389 Encounter for screening for other disorder: Secondary | ICD-10-CM | POA: Diagnosis not present

## 2016-03-13 DIAGNOSIS — M1991 Primary osteoarthritis, unspecified site: Secondary | ICD-10-CM | POA: Diagnosis not present

## 2016-03-13 DIAGNOSIS — E782 Mixed hyperlipidemia: Secondary | ICD-10-CM | POA: Diagnosis not present

## 2016-03-13 DIAGNOSIS — I10 Essential (primary) hypertension: Secondary | ICD-10-CM | POA: Diagnosis not present

## 2016-03-13 DIAGNOSIS — E119 Type 2 diabetes mellitus without complications: Secondary | ICD-10-CM | POA: Diagnosis not present

## 2016-03-20 ENCOUNTER — Other Ambulatory Visit (HOSPITAL_COMMUNITY): Payer: Self-pay | Admitting: Family Medicine

## 2016-03-20 DIAGNOSIS — S92901S Unspecified fracture of right foot, sequela: Secondary | ICD-10-CM

## 2016-04-09 ENCOUNTER — Inpatient Hospital Stay: Admission: RE | Admit: 2016-04-09 | Payer: Medicare Other | Source: Ambulatory Visit

## 2016-04-11 ENCOUNTER — Ambulatory Visit (HOSPITAL_COMMUNITY): Payer: Medicare Other

## 2016-04-11 ENCOUNTER — Ambulatory Visit
Admission: RE | Admit: 2016-04-11 | Discharge: 2016-04-11 | Disposition: A | Discharge status: Home / Self Care | Payer: Medicare Other | Source: Ambulatory Visit | Attending: Urology | Admitting: Urology

## 2016-04-11 DIAGNOSIS — N2889 Other specified disorders of kidney and ureter: Secondary | ICD-10-CM

## 2016-04-11 DIAGNOSIS — K7689 Other specified diseases of liver: Secondary | ICD-10-CM | POA: Diagnosis not present

## 2016-04-11 MED ORDER — GADOXETATE DISODIUM 0.25 MMOL/ML IV SOLN
10.0000 mL | Freq: Once | INTRAVENOUS | Status: AC | PRN
  Administered 2016-04-11: 10 mL via INTRAVENOUS

## 2016-07-08 ENCOUNTER — Ambulatory Visit (HOSPITAL_COMMUNITY)
Admission: RE | Admit: 2016-07-08 | Discharge: 2016-07-08 | Disposition: A | Discharge status: Home / Self Care | Payer: Medicare Other | Source: Ambulatory Visit | Attending: Family Medicine | Admitting: Family Medicine

## 2016-07-08 ENCOUNTER — Other Ambulatory Visit (HOSPITAL_COMMUNITY): Payer: Self-pay | Admitting: Family Medicine

## 2016-07-08 DIAGNOSIS — R05 Cough: Secondary | ICD-10-CM | POA: Diagnosis not present

## 2016-07-08 DIAGNOSIS — J209 Acute bronchitis, unspecified: Secondary | ICD-10-CM | POA: Diagnosis not present

## 2016-07-08 DIAGNOSIS — R0602 Shortness of breath: Secondary | ICD-10-CM | POA: Diagnosis not present

## 2016-07-08 DIAGNOSIS — J069 Acute upper respiratory infection, unspecified: Secondary | ICD-10-CM | POA: Insufficient documentation

## 2016-08-20 ENCOUNTER — Other Ambulatory Visit: Payer: Self-pay | Admitting: Urology

## 2016-08-20 ENCOUNTER — Ambulatory Visit (INDEPENDENT_AMBULATORY_CARE_PROVIDER_SITE_OTHER): Payer: Medicare Other | Admitting: Urology

## 2016-08-20 ENCOUNTER — Ambulatory Visit (HOSPITAL_COMMUNITY)
Admission: RE | Admit: 2016-08-20 | Discharge: 2016-08-20 | Disposition: A | Discharge status: Home / Self Care | Payer: Medicare Other | Source: Ambulatory Visit | Attending: Urology | Admitting: Urology

## 2016-08-20 DIAGNOSIS — N281 Cyst of kidney, acquired: Secondary | ICD-10-CM | POA: Diagnosis not present

## 2016-08-20 DIAGNOSIS — D3000 Benign neoplasm of unspecified kidney: Secondary | ICD-10-CM

## 2016-08-20 DIAGNOSIS — D49511 Neoplasm of unspecified behavior of right kidney: Secondary | ICD-10-CM

## 2017-02-06 DIAGNOSIS — I10 Essential (primary) hypertension: Secondary | ICD-10-CM | POA: Diagnosis not present

## 2017-02-06 DIAGNOSIS — E782 Mixed hyperlipidemia: Secondary | ICD-10-CM | POA: Diagnosis not present

## 2017-02-06 DIAGNOSIS — D582 Other hemoglobinopathies: Secondary | ICD-10-CM | POA: Diagnosis not present

## 2017-02-06 DIAGNOSIS — Z1389 Encounter for screening for other disorder: Secondary | ICD-10-CM | POA: Diagnosis not present

## 2017-02-11 DIAGNOSIS — Z1211 Encounter for screening for malignant neoplasm of colon: Secondary | ICD-10-CM | POA: Diagnosis not present

## 2017-02-11 DIAGNOSIS — E538 Deficiency of other specified B group vitamins: Secondary | ICD-10-CM | POA: Diagnosis not present

## 2017-03-06 DIAGNOSIS — M47816 Spondylosis without myelopathy or radiculopathy, lumbar region: Secondary | ICD-10-CM | POA: Diagnosis not present

## 2017-03-06 DIAGNOSIS — Z1389 Encounter for screening for other disorder: Secondary | ICD-10-CM | POA: Diagnosis not present

## 2017-03-06 DIAGNOSIS — D649 Anemia, unspecified: Secondary | ICD-10-CM | POA: Diagnosis not present

## 2017-03-06 DIAGNOSIS — K219 Gastro-esophageal reflux disease without esophagitis: Secondary | ICD-10-CM | POA: Diagnosis not present

## 2017-03-06 DIAGNOSIS — I10 Essential (primary) hypertension: Secondary | ICD-10-CM | POA: Diagnosis not present

## 2017-03-06 DIAGNOSIS — E1165 Type 2 diabetes mellitus with hyperglycemia: Secondary | ICD-10-CM | POA: Diagnosis not present

## 2017-03-06 DIAGNOSIS — Z23 Encounter for immunization: Secondary | ICD-10-CM | POA: Diagnosis not present

## 2017-06-01 DIAGNOSIS — I10 Essential (primary) hypertension: Secondary | ICD-10-CM | POA: Diagnosis not present

## 2017-06-01 DIAGNOSIS — K219 Gastro-esophageal reflux disease without esophagitis: Secondary | ICD-10-CM | POA: Diagnosis not present

## 2017-06-01 DIAGNOSIS — J209 Acute bronchitis, unspecified: Secondary | ICD-10-CM | POA: Diagnosis not present

## 2017-06-05 ENCOUNTER — Other Ambulatory Visit: Payer: Self-pay | Admitting: Internal Medicine

## 2017-06-05 ENCOUNTER — Other Ambulatory Visit (HOSPITAL_COMMUNITY): Payer: Self-pay | Admitting: Internal Medicine

## 2017-06-05 DIAGNOSIS — Z1231 Encounter for screening mammogram for malignant neoplasm of breast: Secondary | ICD-10-CM

## 2017-06-05 DIAGNOSIS — M545 Low back pain: Secondary | ICD-10-CM

## 2017-06-10 DIAGNOSIS — M47817 Spondylosis without myelopathy or radiculopathy, lumbosacral region: Secondary | ICD-10-CM | POA: Diagnosis not present

## 2017-06-10 DIAGNOSIS — M47816 Spondylosis without myelopathy or radiculopathy, lumbar region: Secondary | ICD-10-CM | POA: Diagnosis not present

## 2017-06-10 DIAGNOSIS — M5127 Other intervertebral disc displacement, lumbosacral region: Secondary | ICD-10-CM | POA: Diagnosis not present

## 2017-06-10 DIAGNOSIS — M5126 Other intervertebral disc displacement, lumbar region: Secondary | ICD-10-CM | POA: Diagnosis not present

## 2017-06-17 ENCOUNTER — Encounter (HOSPITAL_COMMUNITY): Payer: Self-pay

## 2017-06-17 ENCOUNTER — Ambulatory Visit (HOSPITAL_COMMUNITY)
Admission: RE | Admit: 2017-06-17 | Discharge: 2017-06-17 | Disposition: A | Discharge status: Home / Self Care | Payer: Medicare Other | Source: Ambulatory Visit | Attending: Internal Medicine | Admitting: Internal Medicine

## 2017-06-17 DIAGNOSIS — Z1231 Encounter for screening mammogram for malignant neoplasm of breast: Secondary | ICD-10-CM | POA: Diagnosis not present

## 2017-07-06 DIAGNOSIS — I10 Essential (primary) hypertension: Secondary | ICD-10-CM | POA: Diagnosis not present

## 2017-07-06 DIAGNOSIS — M545 Low back pain: Secondary | ICD-10-CM | POA: Diagnosis not present

## 2017-07-06 DIAGNOSIS — G8929 Other chronic pain: Secondary | ICD-10-CM | POA: Diagnosis not present

## 2017-07-20 ENCOUNTER — Ambulatory Visit (HOSPITAL_COMMUNITY): Payer: Medicare Other

## 2017-07-21 ENCOUNTER — Ambulatory Visit (HOSPITAL_COMMUNITY): Payer: Medicare Other | Attending: Neurosurgery

## 2017-07-21 ENCOUNTER — Other Ambulatory Visit: Payer: Self-pay

## 2017-07-21 ENCOUNTER — Encounter (HOSPITAL_COMMUNITY): Payer: Self-pay

## 2017-07-21 DIAGNOSIS — M545 Low back pain: Secondary | ICD-10-CM | POA: Insufficient documentation

## 2017-07-21 DIAGNOSIS — R262 Difficulty in walking, not elsewhere classified: Secondary | ICD-10-CM | POA: Diagnosis not present

## 2017-07-21 DIAGNOSIS — M6281 Muscle weakness (generalized): Secondary | ICD-10-CM | POA: Diagnosis not present

## 2017-07-21 DIAGNOSIS — G8929 Other chronic pain: Secondary | ICD-10-CM | POA: Diagnosis not present

## 2017-07-21 NOTE — Patient Instructions (Signed)
SUPINE: Bridging    Place feet on surface. Tighten abdominal muscle buttock/glutes. Raise hips up. Hold 5 seconds _10__ reps per set, _1 set, _2_ sets per day.  Tighten abdominal muscles in all positions and activities throughout the day.   Robin McKenzie's book "Seven Ways to Have a Pain Free Life."  Copyright  VHI. All rights reserved.

## 2017-07-21 NOTE — Therapy (Signed)
[place], [place], [address] Phone: [phone]   Fax:  [phone]  Physical Therapy Evaluation  Patient Details  Name: Kristine Zamora MRN: [id number] Date of Birth: 04-07-1946 Referring Provider: Consuella Lose, MD   Encounter Date: 07/21/2017  PT End of Session - 07/21/17 1242    Visit Number  1    Number of Visits  4    Date for PT Re-Evaluation  08/18/17    Authorization Type  [employer] co-pay $40     Authorization Time Period  1/1-12/31-2019    Authorization - Visit Number  1    Authorization - Number of Visits  4    PT Start Time  1937    PT Stop Time  1206    PT Time Calculation (min)  48 min    Activity Tolerance  Patient tolerated treatment well    Behavior During Therapy  [facility] for tasks assessed/performed       Past Medical History:  Diagnosis Date  . Asthma   . Back pain   . Diabetes mellitus without complication ([place])   . Hypertension   . Lichen planus atrophicus     Past Surgical History:  Procedure Laterality Date  . COLONOSCOPY  2008   Dr. Gala Romney: pancolonic diverticula, hyperplastic rectosigmoid polyp  . COLONOSCOPY N/A 01/16/2015   Procedure: COLONOSCOPY;  Surgeon: Daneil Dolin, MD;  Location: [facility];  Service: Endoscopy;  Laterality: N/A;  0900    There were no vitals filed for this visit.   Subjective Assessment - 07/21/17 1138    Subjective  Log term low back pain that started about 10 years ago where it became difficult to stand for any length of time.    Pertinent History  previous accident, asthma, HTN, OA, DM    How long can you sit comfortably?  limited to about an hour if not sitting correctly.    How long can you stand comfortably?  10-15 minutes    How long can you walk comfortably?  20 minutes    Diagnostic tests  MRI - doctor reported her spine "looked great for 72 years old."    Patient Stated Goals  I am hoping to get better and have less pain.     Currently in Pain?  Yes    Pain Score  5     Pain Location  Back    Pain Orientation  Right;Left;Lower    Pain Descriptors / Indicators  Dull;Aching    Pain Type  Chronic pain    Pain Radiating Towards  no radiating, will sometimes move up her back along the sides of her spine    Pain Onset  More than a month ago    Pain Frequency  Intermittent ok lying down    Aggravating Factors   vacuuming, cooking, mopping, carrying items    Pain Relieving Factors  lying down, sitting down    Effect of Pain on Daily Activities  have to take intermittient breaks and limit activity the day after doing too much.         [facility] PT Assessment - 07/21/17 0001      Assessment   Medical Diagnosis  LBP Rt s/ sciatica    Referring Provider  Consuella Lose, MD    Onset Date/Surgical Date  07/07/17    Hand Dominance  Right    Prior Therapy  No      Precautions   Precautions  None  Restrictions   Weight Bearing Restrictions  No      Balance Screen   Has the patient fallen in the past 6 months  No    Has the patient had a decrease in activity level because of a fear of falling?   No    Is the patient reluctant to leave their home because of a fear of falling?   No      Home Environment   Living Environment  Private residence    Living Arrangements  Spouse/significant other    Type of [place]      Prior Function   Level of [place] Requirements  retired    [place]      Cognition   Overall Cognitive Status  Within Functional Limits for tasks assessed      Observation/Other Assessments   Focus on Therapeutic Outcomes (FOTO)   46; 54% disability      Posture/Postural Control   Posture/Postural Control  Postural limitations    Postural Limitations  Increased lumbar lordosis;Anterior pelvic tilt    Posture Comments  significant pannus      ROM / Strength   AROM / PROM / Strength  Strength      AROM    AROM Assessment Site  Lumbar    Lumbar Flexion  WNL    Lumbar Extension  75% pain across PSIS's    Lumbar - Right Side Bend  50% rt sided pain    Lumbar - Left Side Bend  75% rt sided pain    Lumbar - Right Rotation  50% rt sided pain    Lumbar - Left Rotation  25% rt sided pain      Strength   Overall Strength  Within functional limits for tasks performed Further assess needed as gen lower extremity weakness suspec    Strength Assessment Site  Other (comment) weak transversus abdominus activation      Palpation   SI assessment   in supine, ASIS and medial malleoli level      Bed Mobility   Supine to Sit  Other (comment) improper mechanics    Sit to Supine  Other (comment) improper mechanics      Ambulation/Gait   Ambulation/Gait  Yes    Ambulation/Gait Assistance  7: Independent    Ambulation Distance (Feet)  50 Feet    Assistive device  None    Gait Pattern  Step-through pattern;Decreased step length - right;Decreased step length - left;Decreased stride length;Antalgic;Lateral trunk lean to right;Lateral trunk lean to left             Objective measurements completed on examination: See above findings.              PT Education - 07/21/17 1228    Education provided  Yes    Education Details  HEP, book on body mechanics to read, improtance of performing HEP if unable to attend PT on a regular basis due to financial strain.    Person(s) Educated  Patient    Methods  Explanation    Comprehension  Verbalized understanding       PT Short Term Goals - 07/21/17 1333      PT SHORT TERM GOAL #1   Title  Patient will exhibit independence and carry over in HEP.    Baseline  Initiated HEP today2-19-19.    Time  2    Period  Weeks    Status  New    Target Date  08/04/17      PT SHORT TERM GOAL #2   Title  Patient will tolerated standing position while preparing a meal for 20 minutes without increased complaints of pain.     Baseline  07-21-17 - 10-15 minutes     Time  2    Period  Weeks    Status  New    Target Date  08/04/17      PT SHORT TERM GOAL #3   Title  Patient will be able to walk for 25 minutes to shop without increased complaints of LBP.    Baseline  07/21/17 - 20 minutes    Time  2    Period  Weeks    Status  New    Target Date  08/04/17        PT Long Term Goals - 07/21/17 1338      PT LONG TERM GOAL #1   Title  Patient will be able to walk for 30 minutes to shop without increased compalints of pain.     Baseline  07/21/17 - 20 minutes    Time  4    Period  Weeks    Status  New    Target Date  08/18/17      PT LONG TERM GOAL #2   Title  Patient will tolerated standing position while preparing a meal for 25 minutes without increased complaints of pain.     Baseline  07/21/17 - 10-15 minutes     Time  4    Period  Weeks    Status  New    Target Date  08/18/17      PT LONG TERM GOAL #3   Title  Patient will receive a FOTO score of 55% or > with 45% disability to exhibit improvements in her functional abilities at home and in the community.    Baseline  07/21/17 - 46%; 54% disability    Time  4    Period  Weeks    Status  New    Target Date  08/18/17      PT LONG TERM GOAL #4   Title  Patient will be able to resume vaccuming and mopping activities in her own home for 5 minutes at a time without increase complaints of LBP to exhibit increased functional abiliity within her home.     Baseline  07/21/17 - unable to mop; increased LBP with vacuuming    Time  4    Period  Weeks    Status  New    Target Date  08/18/17             Plan - 07/21/17 1255    Clinical Impression Statement  Patient is a 72 year old female who present to OPPT with complaints of LBP requiring her to take frequent rest breaks and limits her activity level. Patient exhibits deficits in functional abilities, postural control, muscle weakness, decreased lumbar ROM, impaired gait, decreased trunk muscular coordination and increased pain. Patient  did verbalize that she has a low income and a high co-pay and is concerned she will not be able to afford coming to therapy. Therefore, skilled physical therapy sessions will be primarily geared toward review previous HEP and advancing/adjusting HEP to continue to challenge patient and continue to improve her pain and functional abilities.    History and Personal Factors relevant to plan of care:  previous accident, asthma, HTN, DM,  OA, financial distress    Clinical Presentation  Stable    Clinical Presentation due to:  decreased trunk strength and coordination, impaired gait, Decreased lumbar ROM, decreased postural control, decreased functional ability and increased pain    Clinical Decision Making  Low    Rehab Potential  Fair    Clinical Impairments Affecting Rehab Potential  financial difficulties, obesity    PT Frequency  1x / week    PT Duration  4 weeks    PT Treatment/Interventions  ADLs/Self Care Home Management;Electrical Stimulation;Cryotherapy;Ultrasound;Moist Heat;Gait training;Stair training;Functional mobility training;Neuromuscular re-education;Balance training;Therapeutic exercise;Therapeutic activities;Patient/family education;Manual techniques;Passive range of motion;Energy conservation;Dry needling;Taping    PT Next Visit Plan  review HEP; advance trunk stabilization exercises for HEP, possibly decompression exercises 1-5 for trunk stablization and overall strengthening - seated or standing?    PT Home Exercise Plan  initial - bridging, transversus abdominus activation, read Robin McKenzie's "Seven Ways to Have a Pain Free Life."    Consulted and Agree with Plan of Care  Patient       Patient will benefit from skilled therapeutic intervention in order to improve the following deficits and impairments:  Decreased mobility, Abnormal gait, Difficulty walking, Decreased activity tolerance, Decreased coordination, Decreased strength, Decreased range of motion, Postural  dysfunction  Visit Diagnosis: Muscle weakness (generalized) - Plan: PT plan of care cert/re-cert  Chronic bilateral low back pain without sciatica - Plan: PT plan of care cert/re-cert  Difficulty in walking, not elsewhere classified - Plan: PT plan of care cert/re-cert     Problem List Patient Active Problem List   Diagnosis Date Noted  . History of colonic polyps   . Diverticulosis of colon without hemorrhage   . FH: colon cancer in relative diagnosed at >35 years old 10/31/2014  . GERD (gastroesophageal reflux disease) 10/31/2014  . Boil of vulva 02/07/2014  . Lichen sclerosus et atrophicus of the vulva 10/18/2013    Floria Raveling. Hartnett-Rands, MS, PT [facility] #[id number] 07/21/2017, 1:53 PM  [place] [address] [place], [place], [address] Phone: [phone]   Fax:  [phone]  Name: Kristine Zamora MRN: [id number] Date of Birth: November 08, 1945

## 2017-07-28 ENCOUNTER — Telehealth (HOSPITAL_COMMUNITY): Payer: Self-pay | Admitting: Family Medicine

## 2017-07-28 NOTE — Telephone Encounter (Signed)
07/28/17  pt left a message, I called her back and her husband said she wanted to cx appt and will call back later to reschedule

## 2017-07-31 ENCOUNTER — Encounter (HOSPITAL_COMMUNITY): Payer: Medicare Other | Admitting: Physical Therapy

## 2017-08-19 ENCOUNTER — Ambulatory Visit: Payer: Medicare Other | Admitting: Urology

## 2017-08-27 ENCOUNTER — Other Ambulatory Visit: Payer: Self-pay | Admitting: Urology

## 2017-08-27 DIAGNOSIS — D3000 Benign neoplasm of unspecified kidney: Secondary | ICD-10-CM

## 2017-09-02 ENCOUNTER — Ambulatory Visit (HOSPITAL_COMMUNITY)
Admission: RE | Admit: 2017-09-02 | Discharge: 2017-09-02 | Disposition: A | Discharge status: Home / Self Care | Payer: Medicare Other | Source: Ambulatory Visit | Attending: Urology | Admitting: Urology

## 2017-09-02 DIAGNOSIS — D1771 Benign lipomatous neoplasm of kidney: Secondary | ICD-10-CM | POA: Diagnosis not present

## 2017-09-02 DIAGNOSIS — D3000 Benign neoplasm of unspecified kidney: Secondary | ICD-10-CM

## 2017-09-02 DIAGNOSIS — N281 Cyst of kidney, acquired: Secondary | ICD-10-CM | POA: Diagnosis not present

## 2017-09-09 ENCOUNTER — Ambulatory Visit: Payer: Medicare Other | Admitting: Urology

## 2017-09-09 DIAGNOSIS — D3000 Benign neoplasm of unspecified kidney: Secondary | ICD-10-CM

## 2017-09-09 DIAGNOSIS — R309 Painful micturition, unspecified: Secondary | ICD-10-CM

## 2017-09-10 DIAGNOSIS — E119 Type 2 diabetes mellitus without complications: Secondary | ICD-10-CM | POA: Diagnosis not present

## 2017-09-10 DIAGNOSIS — K219 Gastro-esophageal reflux disease without esophagitis: Secondary | ICD-10-CM | POA: Diagnosis not present

## 2017-09-10 DIAGNOSIS — E782 Mixed hyperlipidemia: Secondary | ICD-10-CM | POA: Diagnosis not present

## 2017-09-10 DIAGNOSIS — M47816 Spondylosis without myelopathy or radiculopathy, lumbar region: Secondary | ICD-10-CM | POA: Diagnosis not present

## 2017-09-10 DIAGNOSIS — Z1389 Encounter for screening for other disorder: Secondary | ICD-10-CM | POA: Diagnosis not present

## 2017-09-10 DIAGNOSIS — I10 Essential (primary) hypertension: Secondary | ICD-10-CM | POA: Diagnosis not present

## 2017-12-07 ENCOUNTER — Encounter: Payer: Self-pay | Admitting: Internal Medicine

## 2017-12-09 ENCOUNTER — Ambulatory Visit: Payer: Medicare Other | Admitting: Urology

## 2017-12-09 DIAGNOSIS — R309 Painful micturition, unspecified: Secondary | ICD-10-CM | POA: Diagnosis not present

## 2017-12-09 DIAGNOSIS — N952 Postmenopausal atrophic vaginitis: Secondary | ICD-10-CM

## 2017-12-25 ENCOUNTER — Ambulatory Visit (HOSPITAL_COMMUNITY)
Admission: RE | Admit: 2017-12-25 | Discharge: 2017-12-25 | Disposition: A | Discharge status: Home / Self Care | Payer: Medicare Other | Source: Ambulatory Visit | Attending: Internal Medicine | Admitting: Internal Medicine

## 2017-12-25 ENCOUNTER — Other Ambulatory Visit (HOSPITAL_COMMUNITY): Payer: Self-pay | Admitting: Internal Medicine

## 2017-12-25 DIAGNOSIS — M1991 Primary osteoarthritis, unspecified site: Secondary | ICD-10-CM | POA: Diagnosis not present

## 2017-12-25 DIAGNOSIS — E119 Type 2 diabetes mellitus without complications: Secondary | ICD-10-CM | POA: Diagnosis not present

## 2017-12-25 DIAGNOSIS — R05 Cough: Secondary | ICD-10-CM | POA: Diagnosis not present

## 2017-12-25 DIAGNOSIS — Z1389 Encounter for screening for other disorder: Secondary | ICD-10-CM | POA: Diagnosis not present

## 2017-12-25 DIAGNOSIS — R059 Cough, unspecified: Secondary | ICD-10-CM

## 2017-12-25 DIAGNOSIS — J329 Chronic sinusitis, unspecified: Secondary | ICD-10-CM | POA: Diagnosis not present

## 2017-12-25 DIAGNOSIS — I10 Essential (primary) hypertension: Secondary | ICD-10-CM | POA: Diagnosis not present

## 2018-01-19 DIAGNOSIS — D485 Neoplasm of uncertain behavior of skin: Secondary | ICD-10-CM | POA: Diagnosis not present

## 2018-01-19 DIAGNOSIS — E7849 Other hyperlipidemia: Secondary | ICD-10-CM | POA: Diagnosis not present

## 2018-01-19 DIAGNOSIS — L821 Other seborrheic keratosis: Secondary | ICD-10-CM | POA: Diagnosis not present

## 2018-01-19 DIAGNOSIS — Z0000 Encounter for general adult medical examination without abnormal findings: Secondary | ICD-10-CM | POA: Diagnosis not present

## 2018-01-19 DIAGNOSIS — Z1389 Encounter for screening for other disorder: Secondary | ICD-10-CM | POA: Diagnosis not present

## 2018-01-19 DIAGNOSIS — K219 Gastro-esophageal reflux disease without esophagitis: Secondary | ICD-10-CM | POA: Diagnosis not present

## 2018-01-19 DIAGNOSIS — E782 Mixed hyperlipidemia: Secondary | ICD-10-CM | POA: Diagnosis not present

## 2018-01-19 DIAGNOSIS — M1991 Primary osteoarthritis, unspecified site: Secondary | ICD-10-CM | POA: Diagnosis not present

## 2018-01-19 DIAGNOSIS — I10 Essential (primary) hypertension: Secondary | ICD-10-CM | POA: Diagnosis not present

## 2018-03-16 DIAGNOSIS — H538 Other visual disturbances: Secondary | ICD-10-CM | POA: Diagnosis not present

## 2018-03-16 DIAGNOSIS — H43391 Other vitreous opacities, right eye: Secondary | ICD-10-CM | POA: Diagnosis not present

## 2018-03-16 DIAGNOSIS — H2513 Age-related nuclear cataract, bilateral: Secondary | ICD-10-CM | POA: Diagnosis not present

## 2018-03-16 DIAGNOSIS — Z7984 Long term (current) use of oral hypoglycemic drugs: Secondary | ICD-10-CM | POA: Diagnosis not present

## 2018-03-16 DIAGNOSIS — E119 Type 2 diabetes mellitus without complications: Secondary | ICD-10-CM | POA: Diagnosis not present

## 2018-03-29 DIAGNOSIS — R062 Wheezing: Secondary | ICD-10-CM | POA: Diagnosis not present

## 2018-03-29 DIAGNOSIS — Z1389 Encounter for screening for other disorder: Secondary | ICD-10-CM | POA: Diagnosis not present

## 2018-03-29 DIAGNOSIS — J0100 Acute maxillary sinusitis, unspecified: Secondary | ICD-10-CM | POA: Diagnosis not present

## 2018-03-29 DIAGNOSIS — R05 Cough: Secondary | ICD-10-CM | POA: Diagnosis not present

## 2018-03-29 DIAGNOSIS — R0981 Nasal congestion: Secondary | ICD-10-CM | POA: Diagnosis not present

## 2018-03-29 DIAGNOSIS — J4520 Mild intermittent asthma, uncomplicated: Secondary | ICD-10-CM | POA: Diagnosis not present

## 2018-03-29 DIAGNOSIS — J029 Acute pharyngitis, unspecified: Secondary | ICD-10-CM | POA: Diagnosis not present

## 2018-03-30 DIAGNOSIS — H2513 Age-related nuclear cataract, bilateral: Secondary | ICD-10-CM | POA: Diagnosis not present

## 2018-03-30 DIAGNOSIS — E119 Type 2 diabetes mellitus without complications: Secondary | ICD-10-CM | POA: Diagnosis not present

## 2018-06-25 DIAGNOSIS — L720 Epidermal cyst: Secondary | ICD-10-CM | POA: Diagnosis not present

## 2018-06-25 DIAGNOSIS — D229 Melanocytic nevi, unspecified: Secondary | ICD-10-CM | POA: Diagnosis not present

## 2018-06-25 DIAGNOSIS — L821 Other seborrheic keratosis: Secondary | ICD-10-CM | POA: Diagnosis not present

## 2018-06-25 DIAGNOSIS — D1801 Hemangioma of skin and subcutaneous tissue: Secondary | ICD-10-CM | POA: Diagnosis not present

## 2018-07-05 DIAGNOSIS — Z1389 Encounter for screening for other disorder: Secondary | ICD-10-CM | POA: Diagnosis not present

## 2018-07-05 DIAGNOSIS — Z23 Encounter for immunization: Secondary | ICD-10-CM | POA: Diagnosis not present

## 2018-07-05 DIAGNOSIS — E119 Type 2 diabetes mellitus without complications: Secondary | ICD-10-CM | POA: Diagnosis not present

## 2018-07-05 DIAGNOSIS — I10 Essential (primary) hypertension: Secondary | ICD-10-CM | POA: Diagnosis not present

## 2018-07-06 DIAGNOSIS — E119 Type 2 diabetes mellitus without complications: Secondary | ICD-10-CM | POA: Diagnosis not present

## 2018-07-06 DIAGNOSIS — I10 Essential (primary) hypertension: Secondary | ICD-10-CM | POA: Diagnosis not present

## 2018-07-06 DIAGNOSIS — Z1389 Encounter for screening for other disorder: Secondary | ICD-10-CM | POA: Diagnosis not present

## 2018-07-06 DIAGNOSIS — Z23 Encounter for immunization: Secondary | ICD-10-CM | POA: Diagnosis not present

## 2018-09-07 ENCOUNTER — Other Ambulatory Visit: Payer: Self-pay | Admitting: Urology

## 2018-09-07 DIAGNOSIS — D3000 Benign neoplasm of unspecified kidney: Secondary | ICD-10-CM

## 2018-09-29 DIAGNOSIS — E119 Type 2 diabetes mellitus without complications: Secondary | ICD-10-CM | POA: Diagnosis not present

## 2018-09-29 DIAGNOSIS — K219 Gastro-esophageal reflux disease without esophagitis: Secondary | ICD-10-CM | POA: Diagnosis not present

## 2018-09-29 DIAGNOSIS — I10 Essential (primary) hypertension: Secondary | ICD-10-CM | POA: Diagnosis not present

## 2018-09-29 DIAGNOSIS — J329 Chronic sinusitis, unspecified: Secondary | ICD-10-CM | POA: Diagnosis not present

## 2018-09-29 DIAGNOSIS — J9801 Acute bronchospasm: Secondary | ICD-10-CM | POA: Diagnosis not present

## 2018-10-11 DIAGNOSIS — E782 Mixed hyperlipidemia: Secondary | ICD-10-CM | POA: Diagnosis not present

## 2018-10-11 DIAGNOSIS — I10 Essential (primary) hypertension: Secondary | ICD-10-CM | POA: Diagnosis not present

## 2018-10-11 DIAGNOSIS — E119 Type 2 diabetes mellitus without complications: Secondary | ICD-10-CM | POA: Diagnosis not present

## 2018-10-11 DIAGNOSIS — Z0001 Encounter for general adult medical examination with abnormal findings: Secondary | ICD-10-CM | POA: Diagnosis not present

## 2018-10-11 DIAGNOSIS — Z1389 Encounter for screening for other disorder: Secondary | ICD-10-CM | POA: Diagnosis not present

## 2018-11-30 ENCOUNTER — Other Ambulatory Visit: Payer: Self-pay | Admitting: Urology

## 2018-11-30 DIAGNOSIS — D3000 Benign neoplasm of unspecified kidney: Secondary | ICD-10-CM

## 2018-12-22 DIAGNOSIS — Z0001 Encounter for general adult medical examination with abnormal findings: Secondary | ICD-10-CM | POA: Diagnosis not present

## 2018-12-22 DIAGNOSIS — Z1389 Encounter for screening for other disorder: Secondary | ICD-10-CM | POA: Diagnosis not present

## 2018-12-22 DIAGNOSIS — E7849 Other hyperlipidemia: Secondary | ICD-10-CM | POA: Diagnosis not present

## 2018-12-22 DIAGNOSIS — E119 Type 2 diabetes mellitus without complications: Secondary | ICD-10-CM | POA: Diagnosis not present

## 2018-12-30 ENCOUNTER — Ambulatory Visit
Admission: RE | Admit: 2018-12-30 | Discharge: 2018-12-30 | Disposition: A | Discharge status: Home / Self Care | Payer: Medicare Other | Source: Ambulatory Visit | Attending: Urology | Admitting: Urology

## 2018-12-30 ENCOUNTER — Other Ambulatory Visit: Payer: Self-pay

## 2018-12-30 DIAGNOSIS — N289 Disorder of kidney and ureter, unspecified: Secondary | ICD-10-CM | POA: Diagnosis not present

## 2018-12-30 DIAGNOSIS — D3000 Benign neoplasm of unspecified kidney: Secondary | ICD-10-CM

## 2018-12-30 DIAGNOSIS — N281 Cyst of kidney, acquired: Secondary | ICD-10-CM | POA: Diagnosis not present

## 2018-12-30 DIAGNOSIS — K760 Fatty (change of) liver, not elsewhere classified: Secondary | ICD-10-CM | POA: Diagnosis not present

## 2018-12-30 DIAGNOSIS — K7689 Other specified diseases of liver: Secondary | ICD-10-CM | POA: Diagnosis not present

## 2018-12-30 MED ORDER — GADOBENATE DIMEGLUMINE 529 MG/ML IV SOLN
20.0000 mL | Freq: Once | INTRAVENOUS | Status: AC | PRN
  Administered 2018-12-30: 20 mL via INTRAVENOUS

## 2019-01-12 ENCOUNTER — Ambulatory Visit (INDEPENDENT_AMBULATORY_CARE_PROVIDER_SITE_OTHER): Payer: Medicare Other | Admitting: Urology

## 2019-01-12 ENCOUNTER — Other Ambulatory Visit: Payer: Self-pay

## 2019-01-12 DIAGNOSIS — D3000 Benign neoplasm of unspecified kidney: Secondary | ICD-10-CM | POA: Diagnosis not present

## 2019-01-13 ENCOUNTER — Other Ambulatory Visit: Payer: Self-pay | Admitting: Urology

## 2019-01-13 DIAGNOSIS — N2889 Other specified disorders of kidney and ureter: Secondary | ICD-10-CM

## 2019-01-19 ENCOUNTER — Other Ambulatory Visit: Payer: Self-pay

## 2019-01-19 ENCOUNTER — Ambulatory Visit
Admission: RE | Admit: 2019-01-19 | Discharge: 2019-01-19 | Disposition: A | Discharge status: Home / Self Care | Payer: Medicare Other | Source: Ambulatory Visit | Attending: Urology | Admitting: Urology

## 2019-01-19 ENCOUNTER — Emergency Department (HOSPITAL_COMMUNITY)
Admission: EM | Admit: 2019-01-19 | Discharge: 2019-01-19 | Disposition: A | Discharge status: Home / Self Care | Payer: Medicare Other | Attending: Emergency Medicine | Admitting: Emergency Medicine

## 2019-01-19 ENCOUNTER — Emergency Department (HOSPITAL_COMMUNITY): Payer: Medicare Other

## 2019-01-19 ENCOUNTER — Encounter (HOSPITAL_COMMUNITY): Payer: Self-pay | Admitting: Emergency Medicine

## 2019-01-19 DIAGNOSIS — K209 Esophagitis, unspecified without bleeding: Secondary | ICD-10-CM

## 2019-01-19 DIAGNOSIS — E119 Type 2 diabetes mellitus without complications: Secondary | ICD-10-CM | POA: Insufficient documentation

## 2019-01-19 DIAGNOSIS — I10 Essential (primary) hypertension: Secondary | ICD-10-CM | POA: Insufficient documentation

## 2019-01-19 DIAGNOSIS — J45909 Unspecified asthma, uncomplicated: Secondary | ICD-10-CM | POA: Diagnosis not present

## 2019-01-19 DIAGNOSIS — R079 Chest pain, unspecified: Secondary | ICD-10-CM | POA: Diagnosis not present

## 2019-01-19 DIAGNOSIS — N2889 Other specified disorders of kidney and ureter: Secondary | ICD-10-CM | POA: Diagnosis not present

## 2019-01-19 DIAGNOSIS — Z87891 Personal history of nicotine dependence: Secondary | ICD-10-CM | POA: Diagnosis not present

## 2019-01-19 HISTORY — PX: IR RADIOLOGIST EVAL & MGMT: IMG5224

## 2019-01-19 LAB — COMPREHENSIVE METABOLIC PANEL
ALT: 14 U/L (ref 0–44)
AST: 16 U/L (ref 15–41)
Albumin: 3.4 g/dL — ABNORMAL LOW (ref 3.5–5.0)
Alkaline Phosphatase: 61 U/L (ref 38–126)
Anion gap: 10 (ref 5–15)
BUN: 9 mg/dL (ref 8–23)
CO2: 27 mmol/L (ref 22–32)
Calcium: 9.3 mg/dL (ref 8.9–10.3)
Chloride: 102 mmol/L (ref 98–111)
Creatinine, Ser: 0.47 mg/dL (ref 0.44–1.00)
GFR calc Af Amer: >60 mL/min (ref >60)
GFR calc non Af Amer: >60 mL/min (ref >60)
Glucose, Bld: 173 mg/dL — ABNORMAL HIGH (ref 70–99)
Potassium: 3.7 mmol/L (ref 3.5–5.1)
Sodium: 139 mmol/L (ref 135–145)
Total Bilirubin: 0.5 mg/dL (ref 0.3–1.2)
Total Protein: 6.3 g/dL — ABNORMAL LOW (ref 6.5–8.1)

## 2019-01-19 LAB — CBC WITH DIFFERENTIAL/PLATELET
Abs Immature Granulocytes: 0.03 10*3/uL (ref 0.00–0.07)
Basophils Absolute: 0.1 10*3/uL (ref 0.0–0.1)
Basophils Relative: 1 %
Eosinophils Absolute: 0.3 10*3/uL (ref 0.0–0.5)
Eosinophils Relative: 4 %
HCT: 33.5 % — ABNORMAL LOW (ref 36.0–46.0)
Hemoglobin: 10.1 g/dL — ABNORMAL LOW (ref 12.0–15.0)
Immature Granulocytes: 0 %
Lymphocytes Relative: 31 %
Lymphs Abs: 2.3 10*3/uL (ref 0.7–4.0)
MCH: 24.7 pg — ABNORMAL LOW (ref 26.0–34.0)
MCHC: 30.1 g/dL (ref 30.0–36.0)
MCV: 81.9 fL (ref 80.0–100.0)
Monocytes Absolute: 0.5 10*3/uL (ref 0.1–1.0)
Monocytes Relative: 7 %
Neutro Abs: 4 10*3/uL (ref 1.7–7.7)
Neutrophils Relative %: 57 %
Platelets: 277 10*3/uL (ref 150–400)
RBC: 4.09 MIL/uL (ref 3.87–5.11)
RDW: 15.8 % — ABNORMAL HIGH (ref 11.5–15.5)
WBC: 7.2 10*3/uL (ref 4.0–10.5)
nRBC: 0 % (ref 0.0–0.2)

## 2019-01-19 LAB — TROPONIN I (HIGH SENSITIVITY): Troponin I (High Sensitivity): 3 ng/L (ref <18)

## 2019-01-19 MED ORDER — PANTOPRAZOLE SODIUM 20 MG PO TBEC: 20.0000 mg | DELAYED_RELEASE_TABLET | Freq: Every day | ORAL | 0 refills | Status: DC

## 2019-01-19 MED ORDER — FAMOTIDINE 20 MG PO TABS: 20.0000 mg | ORAL_TABLET | Freq: Two times a day (BID) | ORAL | 0 refills | Status: DC

## 2019-01-19 MED ORDER — ALUM & MAG HYDROXIDE-SIMETH 200-200-20 MG/5ML PO SUSP
30.0000 mL | Freq: Once | ORAL | Status: AC
  Administered 2019-01-19: 30 mL via ORAL
  Filled 2019-01-19: qty 30

## 2019-01-19 MED ORDER — FAMOTIDINE 20 MG PO TABS
10.0000 mg | ORAL_TABLET | Freq: Once | ORAL | Status: AC
  Administered 2019-01-19: 10 mg via ORAL
  Filled 2019-01-19: qty 1

## 2019-01-19 MED ORDER — LIDOCAINE VISCOUS HCL 2 % MT SOLN
15.0000 mL | Freq: Once | OROMUCOSAL | Status: AC
  Administered 2019-01-19: 15 mL via OROMUCOSAL
  Filled 2019-01-19: qty 15

## 2019-01-19 NOTE — ED Triage Notes (Signed)
Pt C/O chest pain that "goes through to my back" that started around 2100 last night. Pt states it feels like I am swallowing a brick.

## 2019-01-19 NOTE — ED Provider Notes (Signed)
Emergency Department Provider Note   I have reviewed the triage vital signs and the nursing notes.   HISTORY  Chief Complaint Chest Pain   HPI Kristine Zamora is a 73 y.o. female who presents to the ed for chest pain. Has a history of GERD but stopped ranitidine recently. The last 24 hours it 'feels like a brick in lower chest' every time she swallows. No nausea or vomiting. Able to tolerate food. No diaphoresis or light headedness. No h/o same.    No other associated or modifying symptoms.    Past Medical History:  Diagnosis Date  . Asthma   . Back pain   . Diabetes mellitus without complication ([place])   . Hypertension   . Lichen planus atrophicus     Patient Active Problem List   Diagnosis Date Noted  . History of colonic polyps   . Diverticulosis of colon without hemorrhage   . FH: colon cancer in relative diagnosed at >83 years old 10/31/2014  . GERD (gastroesophageal reflux disease) 10/31/2014  . Boil of vulva 02/07/2014  . Lichen sclerosus et atrophicus of the vulva 10/18/2013    Past Surgical History:  Procedure Laterality Date  . COLONOSCOPY  2008   Dr. Gala Romney: pancolonic diverticula, hyperplastic rectosigmoid polyp  . COLONOSCOPY N/A 01/16/2015   Procedure: COLONOSCOPY;  Surgeon: Daneil Dolin, MD;  Location: [facility];  Service: Endoscopy;  Laterality: N/A;  0900    Current Outpatient Rx  . Order #: [id number] Class: Historical Med  . Order #: [id number] Class: Normal  . Order #: [id number] Class: Normal  . Order #: [id number] Class: Print  . Order #: [id number] Class: Historical Med  . Order #: [id number] Class: Historical Med  . Order #: [id number] Class: Normal  . Order #: [id number] Class: Normal  . Order #: [id number] Class: Normal  . Order #: [id number] Class: Historical Med    Allergies Peach [prunus persica]  Family History  Problem Relation Age of Onset  . Alcohol abuse Mother        old age  . Colon cancer Father        diagnosed in his early 13s  .  Breast cancer Daughter     Social History Social History   Tobacco Use  . Smoking status: Former Smoker    Quit date: 12/21/1994    Years since quitting: 24.0  . Smokeless tobacco: Never Used  Substance Use Topics  . Alcohol use: No    Alcohol/week: 0.0 standard drinks    Comment: quit drinking about 1996  . Drug use: No    Review of Systems  All other systems negative except as documented in the HPI. All pertinent positives and negatives as reviewed in the HPI. ____________________________________________   PHYSICAL EXAM:  VITAL SIGNS: ED Triage Vitals  Enc Vitals Group     BP 01/19/19 0410 (!) 155/67     Pulse Rate 01/19/19 0410 96     Resp 01/19/19 0410 17     Temp 01/19/19 0410 98.2 F (36.8 C)     Temp Source 01/19/19 0410 Oral     SpO2 01/19/19 0410 100 %    Constitutional: Alert and oriented. Well appearing and in no acute distress. Eyes: Conjunctivae are normal. PERRL. EOMI. Head: Atraumatic. Nose: No congestion/rhinnorhea. Mouth/Throat: Mucous membranes are moist.  Oropharynx non-erythematous. Neck: No stridor.  No meningeal signs.   Cardiovascular: Normal rate, regular rhythm. Good peripheral circulation. Grossly normal heart sounds.   Respiratory: Normal respiratory effort.  No retractions. Lungs CTAB. Gastrointestinal: Soft and  nontender. No distention.  Musculoskeletal: No lower extremity tenderness nor edema. No gross deformities of extremities. Neurologic:  Normal speech and language. No gross focal neurologic deficits are appreciated.  Skin:  Skin is warm, dry and intact. No rash noted.  ____________________________________________   LABS (all labs ordered are listed, but only abnormal results are displayed)  Labs Reviewed  CBC WITH DIFFERENTIAL/PLATELET - Abnormal; Notable for the following components:      Result Value   Hemoglobin 10.1 (*)    HCT 33.5 (*)    MCH 24.7 (*)    RDW 15.8 (*)    All other components within normal limits   COMPREHENSIVE METABOLIC PANEL - Abnormal; Notable for the following components:   Glucose, Bld 173 (*)    Total Protein 6.3 (*)    Albumin 3.4 (*)    All other components within normal limits  TROPONIN I (HIGH SENSITIVITY)   ____________________________________________  EKG   EKG Interpretation  Date/Time:  Wednesday January 19 2019 04:12:19 EDT Ventricular Rate:  83 PR Interval:    QRS Duration: 52 QT Interval:  455 QTC Calculation: 535 R Axis:   28 Text Interpretation:  Sinus rhythm Low voltage, precordial leads Prolonged QT interval improved ST changes from 2014 Confirmed by Merrily Pew [id number]) on 01/19/2019 4:32:44 AM       ____________________________________________  RADIOLOGY  Dg Chest 2 View  Result Date: 01/19/2019 CLINICAL DATA:  Chest pain extending through to the back. EXAM: CHEST - 2 VIEW COMPARISON:  Two-view chest x-ray 12/25/2017 FINDINGS: The heart size is normal. Lung volumes are low. There is no edema or effusion. No focal airspace disease is present. The visualized soft tissues and bony thorax are unremarkable. IMPRESSION: No active cardiopulmonary disease. Electronically Signed   By: San Morelle M.D.   On: 01/19/2019 05:55    ____________________________________________   PROCEDURES  Procedure(s) performed:   Procedures   ____________________________________________   INITIAL IMPRESSION / ASSESSMENT AND PLAN / ED COURSE  Suspect esophagitis. Doubt ACS but has multiple risk factors so will check. Otherwise supportive care/symptomatic treatment.   Work-up negative.  Symptoms improved with GI treatment.  Will discharge to follow-up with PCP and GI as needed.  Pertinent labs & imaging results that were available during my care of the patient were reviewed by me and considered in my medical decision making (see chart for details).  A medical screening exam was performed and I feel the patient has had an appropriate workup for their  chief complaint at this time and likelihood of emergent condition existing is low. They have been counseled on decision, discharge, follow up and which symptoms necessitate immediate return to the emergency department. They or their family verbally stated understanding and agreement with plan and discharged in stable condition.   ____________________________________________  FINAL CLINICAL IMPRESSION(S) / ED DIAGNOSES  Final diagnoses:  Nonspecific chest pain  Esophagitis     MEDICATIONS GIVEN DURING THIS VISIT:  Medications  alum & mag hydroxide-simeth (MAALOX/MYLANTA) 200-200-20 MG/5ML suspension 30 mL (30 mLs Oral Given 01/19/19 0451)  lidocaine (XYLOCAINE) 2 % viscous mouth solution 15 mL (15 mLs Mouth/Throat Given 01/19/19 0452)  famotidine (PEPCID) tablet 10 mg (10 mg Oral Given 01/19/19 0449)     NEW OUTPATIENT MEDICATIONS STARTED DURING THIS VISIT:  Discharge Medication List as of 01/19/2019  5:59 AM    START taking these medications   Details  famotidine (PEPCID) 20 MG tablet Take 1 tablet (20 mg total) by mouth 2 (two) times daily., Starting  Wed 01/19/2019, Normal    pantoprazole (PROTONIX) 20 MG tablet Take 1 tablet (20 mg total) by mouth daily., Starting Wed 01/19/2019, Normal        Note:  This note was prepared with assistance of Dragon voice recognition software. Occasional wrong-word or sound-a-like substitutions may have occurred due to the inherent limitations of voice recognition software.   Arsalan Brisbin, Corene Cornea, MD 01/19/19 0700

## 2019-01-19 NOTE — Consult Note (Signed)
Chief Complaint:  Slight enlargement of a small right renal mass concerning for renal neoplasm  Referring Physician(s): McKenzie,Patrick L  History of Present Illness: Kristine Zamora is a 73 y.o. female who has had surveillance imaging for indeterminate renal lesions.  The most suspicious right renal mass was originally diagnosed in 2017.  The suspicious right renal mass is partially cystic with septation and enhancement measuring 1.6 cm in 2018 and now 1.9 cm in 2020.  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  She remains asymptomatic.  No significant flank or abdominal pain.  No hematuria or dysuria.  No signs of renal failure.  Past Medical History:  Diagnosis Date   Asthma    Back pain    Diabetes mellitus without complication ([place])    Hypertension    Lichen planus atrophicus     Past Surgical History:  Procedure Laterality Date   COLONOSCOPY  2008   Dr. Gala Romney: pancolonic diverticula, hyperplastic rectosigmoid polyp   COLONOSCOPY N/A 01/16/2015   Procedure: COLONOSCOPY;  Surgeon: Daneil Dolin, MD;  [facility];  Service: Endoscopy;  Laterality: N/A;  0900    Allergies: Peach [prunus persica]  Medications: Prior to Admission medications   Medication Sig Start Date End Date Taking? Authorizing Provider  aspirin 81 MG tablet Take 81 mg by mouth daily.    [provider]  clobetasol cream (TEMOVATE) 7.54 % Apply 1 application topically 2 (two) times daily. 11/08/13   Florian Buff, MD  famotidine (PEPCID) 20 MG tablet Take 1 tablet (20 mg total) by mouth 2 (two) times daily. 01/19/19   Mesner, Corene Cornea, MD  HYDROcodone-acetaminophen (NORCO/VICODIN) 5-325 MG tablet Take 1 tablet by mouth every 6 (six) hours as needed. 10/18/15   Dorie Rank, MD  lisinopril (PRINIVIL,ZESTRIL) 10 MG tablet Take 10 mg by mouth daily.    [provider]  metFORMIN (GLUMETZA) 1000 MG (MOD) 24 hr tablet Take 1,000 mg by  mouth 2 (two) times daily with a meal.     [provider]  Na Sulfate-K Sulfate-Mg Sulf (SUPREP BOWEL PREP) SOLN Take 1 kit by mouth as directed. 12/21/14   Rourk, Cristopher Estimable, MD  pantoprazole (PROTONIX) 20 MG tablet Take 1 tablet (20 mg total) by mouth daily. 01/19/19   Mesner, Corene Cornea, MD  polyethylene glycol-electrolytes (TRILYTE) 420 G solution Take 4,000 mLs by mouth as directed. 12/25/14   Rourk, Cristopher Estimable, MD  pravastatin (PRAVACHOL) 20 MG tablet Take 20 mg by mouth daily.    [provider]     Family History  Problem Relation Age of Onset   Alcohol abuse Mother        old age   Colon cancer Father        diagnosed in his early 38s   Breast cancer Daughter     Social History   Socioeconomic History   Marital status: Married    Spouse name: Not on file   Number of children: Not on file   Years of education: Not on file   Highest education level: Not on file  Occupational History   Not on file  Social Needs   Financial resource strain: Not on file   Food insecurity    Worry: Not on file    Inability: Not on file   Transportation needs    Medical: Not on file    Non-medical: Not on file  Tobacco Use   Smoking status: Former Smoker  Quit date: 12/21/1994    Years since quitting: 24.0   Smokeless tobacco: Never Used  Substance and Sexual Activity   Alcohol use: No    Alcohol/week: 0.0 standard drinks    Comment: quit drinking about 1996   Drug use: No   Sexual activity: Never  Lifestyle   Physical activity    Days per week: Not on file    Minutes per session: Not on file   Stress: Not on file  Relationships   Social connections    Talks on phone: Not on file    Gets together: Not on file    Attends religious service: Not on file    Active member of club or organization: Not on file    Attends meetings of clubs or organizations: Not on file    Relationship status: Not on file  Other Topics Concern   Not on file  Social  History Narrative   Not on file    ECOG Status: 0 - Asymptomatic  Review of Systems  Review of Systems: A 12 point ROS discussed and pertinent positives are indicated in the HPI above.  All other systems are negative.  Physical Exam No direct physical exam was performed, tele visit today.  Vital Signs: There were no vitals taken for this visit.  Imaging: Dg Chest 2 View  Result Date: 01/19/2019 CLINICAL DATA:  Chest pain extending through to the back. EXAM: CHEST - 2 VIEW COMPARISON:  Two-view chest x-ray 12/25/2017 FINDINGS: The heart size is normal. Lung volumes are low. There is no edema or effusion. No focal airspace disease is present. The visualized soft tissues and bony thorax are unremarkable. IMPRESSION: No active cardiopulmonary disease. Electronically Signed   By: San Morelle M.D.   On: 01/19/2019 05:55   Mr Abdomen Wwo Contrast  Result Date: 12/30/2018 CLINICAL DATA:  Renal lesion follow up EXAM: MRI ABDOMEN WITHOUT AND WITH CONTRAST TECHNIQUE: Multiplanar multisequence MR imaging of the abdomen was performed both before and after the administration of intravenous contrast. CONTRAST:  39m MULTIHANCE GADOBENATE DIMEGLUMINE 529 MG/ML IV SOLN COMPARISON:  Multiple exams, including 04/11/2016 FINDINGS: Lower chest: Unremarkable Hepatobiliary: Stable scattered hepatic cysts. The dominant cyst is in the lateral segment left hepatic lobe measures 5.2 by 4.2 cm on image 12/4. Gallbladder unremarkable. No biliary dilatation. There is considerable diffuse hepatic steatosis. Pancreas:  Unremarkable Spleen: The lateral portion of spleen is partially excluded on many sequences due to body habitus. Otherwise unremarkable. Adrenals/Urinary Tract: Both adrenal glands appear normal. Bilateral renal cysts are noted. These include a 1.1 by 0.8 cm Bosniak category 2 cyst of the right mid kidney on image 78/10. Anteriorly in the upper pole of the right kidney, a 1.9 by 1.5 by 1.3 cm (volume =  1.9 cm^3) lesion with primarily accentuated T2 and low T1 signal demonstrates definite enhancement especially along its margins compatible with a small mass lesion. Back on 04/11/2015 this lesion measured 1.3 by 1.1 by 1.1 cm (volume = 0.8 cm^3). Stomach/Bowel: Unremarkable where included. Vascular/Lymphatic: Unremarkable. No findings of tumor thrombus in the renal veins. No adenopathy. Other:  No supplemental non-categorized findings. Musculoskeletal: Suspected hemangioma in the L5 vertebral body. IMPRESSION: 1. Interval moderate increase in size of the peripherally enhancing right kidney upper pole lesion, currently 1.9 cm in long axis. Today the lesion demonstrates less central enhancement and more peripheral enhancement, whereas previously the lesion was centrally enhancing. 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 Given the small size, potential management may include  surveillance, ablation, or resection. 2. Scattered benign hepatic and renal cysts. 3. Diffuse hepatic steatosis. Electronically Signed   By: Van Clines M.D.   On: 12/30/2018 16:35    Labs:  CBC: Recent Labs    01/19/19 0455  WBC 7.2  HGB 10.1*  HCT 33.5*  PLT 277    COAGS: No results for input(s): INR, APTT in the last 8760 hours.  BMP: Recent Labs    01/19/19 0455  NA 139  K 3.7  CL 102  CO2 27  GLUCOSE 173*  BUN 9  CALCIUM 9.3  CREATININE 0.47  GFRNONAA >60  GFRAA >60    LIVER FUNCTION TESTS: Recent Labs    01/19/19 0455  BILITOT 0.5  AST 16  ALT 14  ALKPHOS 61  PROT 6.3*  ALBUMIN 3.4*    TUMOR MARKERS: No results for input(s): AFPTM, CEA, CA199, CHROMGRNA in the last 8760 hours.  Assessment and Plan:  Very slow enlargement of a partially cystic enhancing right kidney cortical lesion previously measuring 1.6 cm and now measuring 1.9 cm.  Lesion size and location is amenable to image guided cryoablation.  The cryoablation procedure was reviewed in detail including the procedure,  risk, benefits and alternatives.  She understands of this would require general anesthesia.  This may require an overnight observation recovery at the hospital.  Procedures performed at [facility].  Additional considerations including continued surveillance were discussed as well as urology performing surgery.  After our discussion she would like to proceed with the image guided cryoablation electively.  Plan: Scheduled for elective right renal cryoablation at [facility].  Thank you for this interesting consult.  I greatly enjoyed meeting MULKI ROESLER and look forward to participating in their care.  A copy of this report was sent to the requesting provider on this date.  Electronically Signed: Greggory Keen 01/19/2019, 3:47 PM   I spent a total of  40 Minutes   in remote  clinical consultation, greater than 50% of which was counseling/coordinating care for this patient with a right renal mass.    Visit type: Audio only (telephone). Audio (no video) only due to patient's lack of internet/smartphone capability. Alternative for in-person consultation at [facility], [address], [place], [place]. This visit type was conducted due to national recommendations for restrictions regarding the COVID-19 Pandemic (e.g. social distancing).  This format is felt to be most appropriate for this patient at this time.  All issues noted in this document were discussed and addressed.

## 2019-01-25 ENCOUNTER — Other Ambulatory Visit (HOSPITAL_COMMUNITY): Payer: Self-pay | Admitting: Interventional Radiology

## 2019-01-25 DIAGNOSIS — N2889 Other specified disorders of kidney and ureter: Secondary | ICD-10-CM

## 2019-02-09 ENCOUNTER — Other Ambulatory Visit: Payer: Self-pay | Admitting: Student

## 2019-02-09 NOTE — Patient Instructions (Addendum)
DUE TO COVID-19 ONLY ONE VISITOR IS ALLOWED TO COME WITH YOU AND STAY IN THE WAITING ROOM ONLY DURING PRE OP AND PROCEDURE DAY OF SURGERY. THE 1 VISITOR MAY VISIT WITH YOU AFTER SURGERY IN YOUR PRIVATE ROOM DURING VISITING HOURS ONLY!  YOU NEED TO HAVE A COVID 19 TEST ON 02-12-2019 AT 1200 PM, THIS TEST MUST BE DONE BEFORE SURGERY, COME  [place], [place] [address] , [id number].  ([place]) ONCE YOUR COVID TEST IS COMPLETED, PLEASE BEGIN THE QUARANTINE INSTRUCTIONS AS OUTLINED IN YOUR HANDOUT.                Jenean Lindau     Your procedure is scheduled on: 02-16-2019   Report to [facility] Main  Entrance   Report to RADIOLOGY at 1030 AM     Call this number if you have problems the morning of surgery [phone]    Remember: Do not eat food or drink liquids :After Midnight. BRUSH YOUR TEETH MORNING OF SURGERY AND RINSE YOUR MOUTH OUT, NO CHEWING GUM CANDY OR MINTS.     Take these medicines the morning of surgery with A SIP OF WATER: ALBUTEROL INHALER IF NEEDED AND BRING INHALER  DO NOT TAKE ANY DIABETIC MEDICATIONS DAY OF YOUR SURGERY     TALE METFORMIN AS USUAL DAY BEFORE SURGERY DO NOT TAKE METFORMIN DAY OF SURGERY                            You may not have any metal on your body including hair pins and              piercings  Do not wear jewelry, make-up, lotions, powders or perfumes, deodorant             Do not wear nail polish.  Do not shave  48 hours prior to surgery.            Do not bring valuables to the hospital. [place].  Contacts, dentures or bridgework may not be worn into surgery.  Leave suitcase in the car. After surgery it may be brought to your room.      _____________________________________________________________________             [facility] - Preparing for Surgery Before surgery, you can play an important role.  Because skin is not sterile, your skin needs to be as  free of germs as possible.  You can reduce the number of germs on your skin by washing with CHG (chlorahexidine gluconate) soap before surgery.  CHG is an antiseptic cleaner which kills germs and bonds with the skin to continue killing germs even after washing. Please DO NOT use if you have an allergy to CHG or antibacterial soaps.  If your skin becomes reddened/irritated stop using the CHG and inform your nurse when you arrive at Short Stay. Do not shave (including legs and underarms) for at least 48 hours prior to the first CHG shower.  You may shave your face/neck. Please follow these instructions carefully:  1.  Shower with CHG Soap the night before surgery and the  morning of Surgery.  2.  If you choose to wash your hair, wash your hair first as usual with your  normal  shampoo.  3.  After you shampoo, rinse your hair and body  thoroughly to remove the  shampoo.                           4.  Use CHG as you would any other liquid soap.  You can apply chg directly  to the skin and wash                       Gently with a scrungie or clean washcloth.  5.  Apply the CHG Soap to your body ONLY FROM THE NECK DOWN.   Do not use on face/ open                           Wound or open sores. Avoid contact with eyes, ears mouth and genitals (private parts).                       Wash face,  Genitals (private parts) with your normal soap.             6.  Wash thoroughly, paying special attention to the area where your surgery  will be performed.  7.  Thoroughly rinse your body with warm water from the neck down.  8.  DO NOT shower/wash with your normal soap after using and rinsing off  the CHG Soap.                9.  Pat yourself dry with a clean towel.            10.  Wear clean pajamas.            11.  Place clean sheets on your bed the night of your first shower and do not  sleep with pets. Day of Surgery : Do not apply any lotions/deodorants the morning of surgery.  Please wear clean clothes to the  hospital/surgery center.  FAILURE TO FOLLOW THESE INSTRUCTIONS MAY RESULT IN THE CANCELLATION OF YOUR SURGERY PATIENT SIGNATURE_________________________________  NURSE SIGNATURE__________________________________  ________________________________________________________________________

## 2019-02-10 ENCOUNTER — Encounter (HOSPITAL_COMMUNITY)
Admission: RE | Admit: 2019-02-10 | Discharge: 2019-02-10 | Disposition: A | Discharge status: Home / Self Care | Payer: Medicare Other | Source: Ambulatory Visit | Attending: Interventional Radiology | Admitting: Interventional Radiology

## 2019-02-10 ENCOUNTER — Other Ambulatory Visit: Payer: Self-pay

## 2019-02-10 ENCOUNTER — Encounter (HOSPITAL_COMMUNITY): Payer: Self-pay

## 2019-02-10 DIAGNOSIS — Z79899 Other long term (current) drug therapy: Secondary | ICD-10-CM | POA: Diagnosis not present

## 2019-02-10 DIAGNOSIS — J45909 Unspecified asthma, uncomplicated: Secondary | ICD-10-CM | POA: Diagnosis not present

## 2019-02-10 DIAGNOSIS — I10 Essential (primary) hypertension: Secondary | ICD-10-CM | POA: Insufficient documentation

## 2019-02-10 DIAGNOSIS — Z20828 Contact with and (suspected) exposure to other viral communicable diseases: Secondary | ICD-10-CM | POA: Insufficient documentation

## 2019-02-10 DIAGNOSIS — Z01818 Encounter for other preprocedural examination: Secondary | ICD-10-CM | POA: Diagnosis not present

## 2019-02-10 DIAGNOSIS — Z7982 Long term (current) use of aspirin: Secondary | ICD-10-CM | POA: Diagnosis not present

## 2019-02-10 DIAGNOSIS — E119 Type 2 diabetes mellitus without complications: Secondary | ICD-10-CM | POA: Insufficient documentation

## 2019-02-10 DIAGNOSIS — N2889 Other specified disorders of kidney and ureter: Secondary | ICD-10-CM | POA: Diagnosis not present

## 2019-02-10 DIAGNOSIS — Z87891 Personal history of nicotine dependence: Secondary | ICD-10-CM | POA: Insufficient documentation

## 2019-02-10 HISTORY — DX: Anemia, unspecified: D64.9

## 2019-02-10 HISTORY — DX: Other specified disorders of kidney and ureter: N28.89

## 2019-02-10 HISTORY — DX: Other specified postprocedural states: Z98.890

## 2019-02-10 HISTORY — DX: Nausea with vomiting, unspecified: R11.2

## 2019-02-10 HISTORY — DX: Unspecified osteoarthritis, unspecified site: M19.90

## 2019-02-10 LAB — CBC WITH DIFFERENTIAL/PLATELET
Abs Immature Granulocytes: 0.03 10*3/uL (ref 0.00–0.07)
Basophils Absolute: 0 10*3/uL (ref 0.0–0.1)
Basophils Relative: 1 %
Eosinophils Absolute: 0.2 10*3/uL (ref 0.0–0.5)
Eosinophils Relative: 3 %
HCT: 37.3 % (ref 36.0–46.0)
Hemoglobin: 10.9 g/dL — ABNORMAL LOW (ref 12.0–15.0)
Immature Granulocytes: 0 %
Lymphocytes Relative: 23 %
Lymphs Abs: 1.9 10*3/uL (ref 0.7–4.0)
MCH: 24.1 pg — ABNORMAL LOW (ref 26.0–34.0)
MCHC: 29.2 g/dL — ABNORMAL LOW (ref 30.0–36.0)
MCV: 82.5 fL (ref 80.0–100.0)
Monocytes Absolute: 0.5 10*3/uL (ref 0.1–1.0)
Monocytes Relative: 6 %
Neutro Abs: 5.7 10*3/uL (ref 1.7–7.7)
Neutrophils Relative %: 67 %
Platelets: 293 10*3/uL (ref 150–400)
RBC: 4.52 MIL/uL (ref 3.87–5.11)
RDW: 15.2 % (ref 11.5–15.5)
WBC: 8.4 10*3/uL (ref 4.0–10.5)
nRBC: 0 % (ref 0.0–0.2)

## 2019-02-10 LAB — GLUCOSE, CAPILLARY: Glucose-Capillary: 144 mg/dL — ABNORMAL HIGH (ref 70–99)

## 2019-02-10 LAB — BASIC METABOLIC PANEL
Anion gap: 14 (ref 5–15)
BUN: 14 mg/dL (ref 8–23)
CO2: 27 mmol/L (ref 22–32)
Calcium: 9 mg/dL (ref 8.9–10.3)
Chloride: 99 mmol/L (ref 98–111)
Creatinine, Ser: 0.54 mg/dL (ref 0.44–1.00)
GFR calc Af Amer: >60 mL/min (ref >60)
GFR calc non Af Amer: >60 mL/min (ref >60)
Glucose, Bld: 151 mg/dL — ABNORMAL HIGH (ref 70–99)
Potassium: 3.9 mmol/L (ref 3.5–5.1)
Sodium: 140 mmol/L (ref 135–145)

## 2019-02-10 LAB — PROTIME-INR
INR: 1 (ref 0.8–1.2)
Prothrombin Time: 13.2 seconds (ref 11.4–15.2)

## 2019-02-10 LAB — APTT: aPTT: 26 seconds (ref 24–36)

## 2019-02-10 NOTE — Progress Notes (Signed)
PCP - DR Redmond School Cardiologist - NONE  Chest x-ray - NONE EKG - SPOKE WITH JESSICA ZANETTO PA AND REVIEWED 01-19-19 EKG, ORDERS RECEIVED TO REPEAT EKG TODAY AT PRE OP Stress Test - NONE ECHO - NONE Cardiac Cath - NONE  Sleep Study - NONE CPAP - NONE  Fasting Blood Sugar - DOES NOT CHECK CBG  Checks Blood Sugar _____ times a day  Blood Thinner Instructions: Aspirin Instructions: 81 MG PER IR INSTRUCTIONS STAY ON ASPIRIN. Last Dose:  Anesthesia review:   Patient denies shortness of breath, fever, cough and chest pain at PAT appointment   Patient verbalized understanding of instructions that were given to them at the PAT appointment. Patient was also instructed that they will need to review over the PAT instructions again at home before surgery.

## 2019-02-11 LAB — ABO/RH: ABO/RH(D): O POS

## 2019-02-11 LAB — HEMOGLOBIN A1C
Hgb A1c MFr Bld: 6.9 % — ABNORMAL HIGH (ref 4.8–5.6)
Mean Plasma Glucose: 151 mg/dL

## 2019-02-12 ENCOUNTER — Other Ambulatory Visit (HOSPITAL_COMMUNITY)
Admission: RE | Admit: 2019-02-12 | Discharge: 2019-02-12 | Disposition: A | Discharge status: Home / Self Care | Payer: Medicare Other | Source: Ambulatory Visit | Attending: Interventional Radiology | Admitting: Interventional Radiology

## 2019-02-12 DIAGNOSIS — Z7982 Long term (current) use of aspirin: Secondary | ICD-10-CM | POA: Diagnosis not present

## 2019-02-12 DIAGNOSIS — Z87891 Personal history of nicotine dependence: Secondary | ICD-10-CM | POA: Diagnosis not present

## 2019-02-12 DIAGNOSIS — N2889 Other specified disorders of kidney and ureter: Secondary | ICD-10-CM | POA: Diagnosis not present

## 2019-02-12 DIAGNOSIS — Z79899 Other long term (current) drug therapy: Secondary | ICD-10-CM | POA: Diagnosis not present

## 2019-02-12 DIAGNOSIS — J45909 Unspecified asthma, uncomplicated: Secondary | ICD-10-CM | POA: Diagnosis not present

## 2019-02-12 DIAGNOSIS — I10 Essential (primary) hypertension: Secondary | ICD-10-CM | POA: Diagnosis not present

## 2019-02-12 DIAGNOSIS — E119 Type 2 diabetes mellitus without complications: Secondary | ICD-10-CM | POA: Diagnosis not present

## 2019-02-12 DIAGNOSIS — Z01818 Encounter for other preprocedural examination: Secondary | ICD-10-CM | POA: Diagnosis not present

## 2019-02-13 LAB — NOVEL CORONAVIRUS, NAA (HOSP ORDER, SEND-OUT TO REF LAB; TAT 18-24 HRS): SARS-CoV-2, NAA: NOT DETECTED

## 2019-02-15 ENCOUNTER — Other Ambulatory Visit: Payer: Self-pay | Admitting: Radiology

## 2019-02-16 ENCOUNTER — Other Ambulatory Visit: Payer: Self-pay

## 2019-02-16 ENCOUNTER — Encounter (HOSPITAL_COMMUNITY): Payer: Self-pay

## 2019-02-16 ENCOUNTER — Ambulatory Visit (HOSPITAL_COMMUNITY): Payer: Medicare Other | Admitting: Certified Registered"

## 2019-02-16 ENCOUNTER — Encounter (HOSPITAL_COMMUNITY): Payer: Self-pay | Admitting: *Deleted

## 2019-02-16 ENCOUNTER — Ambulatory Visit (HOSPITAL_COMMUNITY): Payer: Medicare Other | Admitting: Physician Assistant

## 2019-02-16 ENCOUNTER — Observation Stay (HOSPITAL_COMMUNITY)
Admission: RE | Admit: 2019-02-16 | Discharge: 2019-02-17 | Disposition: A | Discharge status: Home / Self Care | Payer: Medicare Other | Attending: Interventional Radiology | Admitting: Interventional Radiology

## 2019-02-16 ENCOUNTER — Encounter (HOSPITAL_COMMUNITY)
Admission: RE | Disposition: A | Discharge status: Home / Self Care | Payer: Self-pay | Source: Home / Self Care | Attending: Interventional Radiology

## 2019-02-16 ENCOUNTER — Ambulatory Visit (HOSPITAL_COMMUNITY)
Admission: RE | Admit: 2019-02-16 | Discharge: 2019-02-16 | Disposition: A | Discharge status: Home / Self Care | Payer: Medicare Other | Source: Ambulatory Visit | Attending: Interventional Radiology | Admitting: Interventional Radiology

## 2019-02-16 DIAGNOSIS — Z79899 Other long term (current) drug therapy: Secondary | ICD-10-CM | POA: Insufficient documentation

## 2019-02-16 DIAGNOSIS — M1990 Unspecified osteoarthritis, unspecified site: Secondary | ICD-10-CM | POA: Diagnosis not present

## 2019-02-16 DIAGNOSIS — Z7984 Long term (current) use of oral hypoglycemic drugs: Secondary | ICD-10-CM | POA: Diagnosis not present

## 2019-02-16 DIAGNOSIS — C641 Malignant neoplasm of right kidney, except renal pelvis: Principal | ICD-10-CM | POA: Diagnosis present

## 2019-02-16 DIAGNOSIS — K760 Fatty (change of) liver, not elsewhere classified: Secondary | ICD-10-CM | POA: Diagnosis not present

## 2019-02-16 DIAGNOSIS — E119 Type 2 diabetes mellitus without complications: Secondary | ICD-10-CM | POA: Diagnosis not present

## 2019-02-16 DIAGNOSIS — Z7982 Long term (current) use of aspirin: Secondary | ICD-10-CM | POA: Diagnosis not present

## 2019-02-16 DIAGNOSIS — I10 Essential (primary) hypertension: Secondary | ICD-10-CM | POA: Insufficient documentation

## 2019-02-16 DIAGNOSIS — N2889 Other specified disorders of kidney and ureter: Secondary | ICD-10-CM

## 2019-02-16 DIAGNOSIS — J45909 Unspecified asthma, uncomplicated: Secondary | ICD-10-CM | POA: Diagnosis not present

## 2019-02-16 DIAGNOSIS — D649 Anemia, unspecified: Secondary | ICD-10-CM | POA: Diagnosis not present

## 2019-02-16 HISTORY — PX: RADIOLOGY WITH ANESTHESIA: SHX6223

## 2019-02-16 HISTORY — DX: Dyspnea, unspecified: R06.00

## 2019-02-16 LAB — TYPE AND SCREEN
ABO/RH(D): O POS
Antibody Screen: NEGATIVE

## 2019-02-16 LAB — GLUCOSE, CAPILLARY
Glucose-Capillary: 153 mg/dL — ABNORMAL HIGH (ref 70–99)
Glucose-Capillary: 163 mg/dL — ABNORMAL HIGH (ref 70–99)

## 2019-02-16 SURGERY — CT WITH ANESTHESIA
Anesthesia: General | Laterality: Right

## 2019-02-16 MED ORDER — PHENYLEPHRINE 40 MCG/ML (10ML) SYRINGE FOR IV PUSH (FOR BLOOD PRESSURE SUPPORT)
PREFILLED_SYRINGE | INTRAVENOUS | Status: DC | PRN
  Administered 2019-02-16: 80 ug via INTRAVENOUS
  Administered 2019-02-16: 120 ug via INTRAVENOUS
  Administered 2019-02-16: 80 ug via INTRAVENOUS

## 2019-02-16 MED ORDER — ONDANSETRON HCL 4 MG/2ML IJ SOLN
INTRAMUSCULAR | Status: DC | PRN
  Administered 2019-02-16: 4 mg via INTRAVENOUS

## 2019-02-16 MED ORDER — FENTANYL CITRATE (PF) 100 MCG/2ML IJ SOLN: 25.0000 ug | INTRAMUSCULAR | Status: DC | PRN

## 2019-02-16 MED ORDER — ROCURONIUM BROMIDE 10 MG/ML (PF) SYRINGE
PREFILLED_SYRINGE | INTRAVENOUS | Status: DC | PRN
  Administered 2019-02-16: 50 mg via INTRAVENOUS
  Administered 2019-02-16 (×2): 20 mg via INTRAVENOUS

## 2019-02-16 MED ORDER — FENTANYL CITRATE (PF) 250 MCG/5ML IJ SOLN
INTRAMUSCULAR | Status: DC | PRN
  Administered 2019-02-16 (×2): 50 ug via INTRAVENOUS

## 2019-02-16 MED ORDER — MIDAZOLAM HCL 2 MG/2ML IJ SOLN
INTRAMUSCULAR | Status: AC
  Filled 2019-02-16: qty 2

## 2019-02-16 MED ORDER — HYDROCODONE-ACETAMINOPHEN 5-325 MG PO TABS
1.0000 | ORAL_TABLET | ORAL | Status: DC | PRN
  Administered 2019-02-16: 1 via ORAL
  Filled 2019-02-16: qty 1

## 2019-02-16 MED ORDER — ACETAMINOPHEN 10 MG/ML IV SOLN: 1000.0000 mg | Freq: Once | INTRAVENOUS | Status: DC | PRN

## 2019-02-16 MED ORDER — SUGAMMADEX SODIUM 200 MG/2ML IV SOLN
INTRAVENOUS | Status: DC | PRN
  Administered 2019-02-16: 200 mg via INTRAVENOUS

## 2019-02-16 MED ORDER — LISINOPRIL 10 MG PO TABS
10.0000 mg | ORAL_TABLET | Freq: Every day | ORAL | Status: DC
  Filled 2019-02-16 (×2): qty 1

## 2019-02-16 MED ORDER — ONDANSETRON HCL 4 MG/2ML IJ SOLN: 4.0000 mg | Freq: Four times a day (QID) | INTRAMUSCULAR | Status: DC | PRN

## 2019-02-16 MED ORDER — CEFAZOLIN SODIUM-DEXTROSE 2-4 GM/100ML-% IV SOLN
INTRAVENOUS | Status: AC
  Filled 2019-02-16: qty 100

## 2019-02-16 MED ORDER — LACTATED RINGERS IV SOLN
INTRAVENOUS | Status: DC
  Administered 2019-02-16: 11:00:00 via INTRAVENOUS

## 2019-02-16 MED ORDER — LIDOCAINE 2% (20 MG/ML) 5 ML SYRINGE
INTRAMUSCULAR | Status: DC | PRN
  Administered 2019-02-16: 50 mg via INTRAVENOUS

## 2019-02-16 MED ORDER — PROPOFOL 10 MG/ML IV BOLUS
INTRAVENOUS | Status: DC | PRN
  Administered 2019-02-16: 120 mg via INTRAVENOUS

## 2019-02-16 MED ORDER — DEXAMETHASONE SODIUM PHOSPHATE 10 MG/ML IJ SOLN
INTRAMUSCULAR | Status: DC | PRN
  Administered 2019-02-16: 8 mg via INTRAVENOUS

## 2019-02-16 MED ORDER — MIDAZOLAM HCL 2 MG/2ML IJ SOLN
INTRAMUSCULAR | Status: DC | PRN
  Administered 2019-02-16: 2 mg via INTRAVENOUS

## 2019-02-16 MED ORDER — PRAVASTATIN SODIUM 20 MG PO TABS
20.0000 mg | ORAL_TABLET | Freq: Every day | ORAL | Status: DC
  Administered 2019-02-16: 20 mg via ORAL
  Filled 2019-02-16: qty 1

## 2019-02-16 MED ORDER — SODIUM CHLORIDE (PF) 0.9 % IJ SOLN
INTRAMUSCULAR | Status: AC
  Filled 2019-02-16: qty 100

## 2019-02-16 MED ORDER — ALBUTEROL SULFATE (2.5 MG/3ML) 0.083% IN NEBU: 2.5000 mg | INHALATION_SOLUTION | Freq: Four times a day (QID) | RESPIRATORY_TRACT | Status: DC | PRN

## 2019-02-16 MED ORDER — DOCUSATE SODIUM 100 MG PO CAPS
100.0000 mg | ORAL_CAPSULE | Freq: Two times a day (BID) | ORAL | Status: DC
  Administered 2019-02-16 – 2019-02-17 (×2): 100 mg via ORAL
  Filled 2019-02-16 (×2): qty 1

## 2019-02-16 MED ORDER — PROMETHAZINE HCL 25 MG/ML IJ SOLN: 6.2500 mg | INTRAMUSCULAR | Status: DC | PRN

## 2019-02-16 MED ORDER — METFORMIN HCL 500 MG PO TABS
1000.0000 mg | ORAL_TABLET | Freq: Two times a day (BID) | ORAL | Status: DC
  Administered 2019-02-17: 1000 mg via ORAL
  Filled 2019-02-16: qty 2

## 2019-02-16 MED ORDER — IOHEXOL 350 MG/ML SOLN
50.0000 mL | Freq: Once | INTRAVENOUS | Status: AC | PRN
  Administered 2019-02-16: 50 mL via INTRAVENOUS

## 2019-02-16 MED ORDER — CEFAZOLIN SODIUM-DEXTROSE 2-4 GM/100ML-% IV SOLN
2.0000 g | INTRAVENOUS | Status: AC
  Administered 2019-02-16: 13:00:00 2 g via INTRAVENOUS

## 2019-02-16 MED ORDER — FENTANYL CITRATE (PF) 250 MCG/5ML IJ SOLN
INTRAMUSCULAR | Status: AC
  Filled 2019-02-16: qty 5

## 2019-02-16 NOTE — Transfer of Care (Signed)
Immediate Anesthesia Transfer of Care Note  Patient: CLAUDA ROE  Procedure(s) Performed: CT CRYOABLATION RIGHT RENAL MASS (Right )  Patient Location: PACU  Anesthesia Type:General  Level of Consciousness: awake, alert  and oriented  Airway & Oxygen Therapy: Patient Spontanous Breathing and Patient connected to nasal cannula oxygen  Post-op Assessment: Report given to RN and Post -op Vital signs reviewed and stable  Post vital signs: Reviewed and stable  Last Vitals:  Vitals Value Taken Time  BP 155/81 02/16/19 1525  Temp    Pulse 121 02/16/19 1526  Resp 24 02/16/19 1526  SpO2 99 % 02/16/19 1526  Vitals shown include unvalidated device data.  Last Pain:  Vitals:   02/16/19 1024  TempSrc: Oral  PainSc: 0-No pain      Patients Stated Pain Goal: 4 ([id number])  Complications: No apparent anesthesia complications

## 2019-02-16 NOTE — H&P (Addendum)
Referring Physician(s): McKenzie,P  Supervising Physician: Daryll Brod  Patient Status:  [facility] OP TBA  Chief Complaint: Right renal cystic mass   Subjective: Patient familiar to IR service from recent tele consultation with Dr. Annamaria Boots on 01/19/2019 to discuss treatment options for slowly enlarging right renal cystic mass.  The mass has been under surveillance since 2017.  It is partially cystic with septation and enhancement measuring 1.6 cm in 2018 and now 1.9 cm.  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  Following discussions with Dr. Annamaria Boots she was deemed an appropriate candidate for CT-guided cryoablation/possible biopsy of the right renal mass and presents today for the procedure. Additional imaging findings included scattered benign hepatic and renal cysts and diffuse hepatic steatosis. She currently denies fever, headache, chest pain, dyspnea, cough, abdominal/back pain, nausea, vomiting or bleeding.  Additional past medical history as below.  Past Medical History:  Diagnosis Date  . Anemia   . Arthritis    HANDS AND FEET  . Asthma   . Back pain   . Diabetes mellitus without complication ([place])    TYPE 2  . Dyspnea    with exersion   . Hypertension   . Lichen planus atrophicus   . PONV (postoperative nausea and vomiting)   . Right renal mass    Past Surgical History:  Procedure Laterality Date  . BREAST SURGERY  1966   MILK TUBE REMOVED  . COLONOSCOPY  2008   Dr. Gala Romney: pancolonic diverticula, hyperplastic rectosigmoid polyp  . COLONOSCOPY N/A 01/16/2015   Procedure: COLONOSCOPY;  Surgeon: Daneil Dolin, MD;  [facility];  Service: Endoscopy;  Laterality: N/A;  0900  . IR RADIOLOGIST EVAL & MGMT  01/19/2019  . TONSILLECTOMY  1965       Allergies: Peach [prunus persica]  Medications: Prior to Admission medications   Medication Sig Start Date End Date Taking? Authorizing Provider  aspirin 81 MG tablet Take 81 mg by  mouth daily.   Yes [provider]  lisinopril (PRINIVIL,ZESTRIL) 10 MG tablet Take 10 mg by mouth daily.   Yes [provider]  metFORMIN (GLUCOPHAGE) 1000 MG tablet Take 1,000 mg by mouth 2 (two) times daily with a meal. 10/11/18  Yes [provider]  pravastatin (PRAVACHOL) 20 MG tablet Take 20 mg by mouth at bedtime.    Yes [provider]  albuterol (VENTOLIN HFA) 108 (90 Base) MCG/ACT inhaler Inhale 1-2 puffs into the lungs every 6 (six) hours as needed for wheezing or shortness of breath.    [provider]  famotidine (PEPCID) 20 MG tablet Take 1 tablet (20 mg total) by mouth 2 (two) times daily. Patient not taking: Reported on 02/04/2019 01/19/19   Mesner, Corene Cornea, MD  pantoprazole (PROTONIX) 20 MG tablet Take 1 tablet (20 mg total) by mouth daily. Patient not taking: Reported on 02/04/2019 01/19/19   Mesner, Corene Cornea, MD     Vital Signs: BP 140/63   Pulse 83   Temp 98.2 F (36.8 C) (Oral)   Resp 15   SpO2 98%   Physical Exam awake, alert.  Chest clear to auscultation bilaterally.  Heart with regular rate and rhythm.  Abdomen obese, soft, positive bowel sounds, nontender.  No significant lower extremity edema.  Imaging: No results found.  Labs:  CBC: Recent Labs    01/19/19 0455 02/10/19 1410  WBC 7.2 8.4  HGB 10.1* 10.9*  HCT 33.5* 37.3  PLT 277 293    COAGS: Recent  Labs    02/10/19 1410  INR 1.0  APTT 26    BMP: Recent Labs    01/19/19 0455 02/10/19 1410  NA 139 140  K 3.7 3.9  CL 102 99  CO2 27 27  GLUCOSE 173* 151*  BUN 9 14  CALCIUM 9.3 9.0  CREATININE 0.47 0.54  GFRNONAA >60 >60  GFRAA >60 >60    LIVER FUNCTION TESTS: Recent Labs    01/19/19 0455  BILITOT 0.5  AST 16  ALT 14  ALKPHOS 61  PROT 6.3*  ALBUMIN 3.4*    Assessment and Plan: Patient with history of slightly enlarging right upper pole renal cystic mass (1.9 cm) concerning for small renal cell carcinoma; tele consultation done on  01/19/2019 with Dr. Annamaria Boots and patient deemed an appropriate candidate for CT-guided cryoablation/possible biopsy of the right renal mass.  She presents today for the procedure.  Details/risks of procedure, including but not limited to, internal bleeding, infection, injury to adjacent structures, anesthesia related complications discussed with patient with her understanding and consent.  She is COVID-19 negative.  Post procedure she will be admitted to the hospital for overnight observation. This procedure involves the use of CT and because of the nature of the planned procedure, it is possible that we will have prolonged use of CT fluoroscopy.  Potential radiation risks to you include (but are not limited to) the following: - A slightly elevated risk for cancer  several years later in life. This risk is typically less than 0.5% percent. This risk is low in comparison to the normal incidence of human cancer, which is 33% for women and 50% for men according to the [place]. - Radiation induced injury can include skin redness, resembling a rash, tissue breakdown / ulcers and hair loss (which can be temporary or permanent).   The likelihood of either of these occurring depends on the difficulty of the procedure and whether you are sensitive to radiation due to previous procedures, disease, or genetic conditions.   IF your procedure requires a prolonged use of radiation, you will be notified and given written instructions for further action.  It is your responsibility to monitor the irradiated area for the 2 weeks following the procedure and to notify your physician if you are concerned that you have suffered a radiation induced injury.       Electronically Signed: D. Rowe Robert, PA-C 02/16/2019, 10:50 AM   I spent a total of 30 minutes at the the patient's bedside AND on the patient's hospital floor or unit, greater than 50% of which was counseling/coordinating care for CT-guided  cryoablation/possible biopsy of right renal cystic mass

## 2019-02-16 NOTE — Anesthesia Procedure Notes (Signed)
Procedure Name: Intubation Date/Time: 02/16/2019 12:47 PM Performed by: Niel Hummer, CRNA Pre-anesthesia Checklist: Patient identified, Emergency Drugs available, Suction available and Patient being monitored Patient Re-evaluated:Patient Re-evaluated prior to induction Oxygen Delivery Method: Circle system utilized Preoxygenation: Pre-oxygenation with 100% oxygen Induction Type: IV induction Ventilation: Mask ventilation without difficulty and Oral airway inserted - appropriate to patient size Laryngoscope Size: Mac and 4 Grade View: Grade I Tube type: Oral Number of attempts: 1 Airway Equipment and Method: Stylet Placement Confirmation: ETT inserted through vocal cords under direct vision,  positive ETCO2 and breath sounds checked- equal and bilateral Secured at: 22 cm Tube secured with: Tape Dental Injury: Teeth and Oropharynx as per pre-operative assessment

## 2019-02-16 NOTE — Procedures (Signed)
Rt renal mass c/w cystic renal cell by MRI  S/p CT guided Cryo  No comp Stable ebl min Full report in pacs

## 2019-02-16 NOTE — Anesthesia Postprocedure Evaluation (Signed)
Anesthesia Post Note  Patient: Kristine Zamora  Procedure(s) Performed: CT CRYOABLATION RIGHT RENAL MASS (Right )     Patient location during evaluation: PACU Anesthesia Type: General Level of consciousness: awake and alert, oriented and patient cooperative Pain management: pain level controlled Vital Signs Assessment: post-procedure vital signs reviewed and stable Respiratory status: spontaneous breathing, nonlabored ventilation and respiratory function stable Cardiovascular status: blood pressure returned to baseline and stable Postop Assessment: no apparent nausea or vomiting Anesthetic complications: no    Last Vitals:  Vitals:   02/16/19 1615 02/16/19 1630  BP: (!) 153/69 (!) 158/73  Pulse: 78 81  Resp: 13 (!) 21  Temp:  36.8 C  SpO2: 100% 100%    Last Pain:  Vitals:   02/16/19 1630  TempSrc:   PainSc: 0-No pain                 Jarome Matin Artur Winningham

## 2019-02-16 NOTE — Anesthesia Preprocedure Evaluation (Signed)
Anesthesia Evaluation  Patient identified by MRN, date of birth, ID band Patient awake    Reviewed: Allergy & Precautions, NPO status , Patient's Chart, lab work & pertinent test results  Airway Mallampati: II  TM Distance: >3 FB     Dental  (+) Dental Advisory Given   Pulmonary asthma , former smoker,    breath sounds clear to auscultation       Cardiovascular hypertension, Pt. on medications  Rhythm:Regular Rate:Normal     Neuro/Psych negative neurological ROS     GI/Hepatic Neg liver ROS, GERD  ,  Endo/Other  diabetes, Type 2, Oral Hypoglycemic Agents  Renal/GU Renal disease (renal mass)     Musculoskeletal  (+) Arthritis ,   Abdominal   Peds  Hematology  (+) anemia ,   Anesthesia Other Findings   Reproductive/Obstetrics                             Lab Results  Component Value Date   WBC 8.4 02/10/2019   HGB 10.9 (L) 02/10/2019   HCT 37.3 02/10/2019   MCV 82.5 02/10/2019   PLT 293 02/10/2019   Lab Results  Component Value Date   CREATININE 0.54 02/10/2019   BUN 14 02/10/2019   NA 140 02/10/2019   K 3.9 02/10/2019   CL 99 02/10/2019   CO2 27 02/10/2019    Anesthesia Physical Anesthesia Plan  ASA: III  Anesthesia Plan: General   Post-op Pain Management:    Induction: Intravenous  PONV Risk Score and Plan: 3 and Dexamethasone, Ondansetron and Treatment may vary due to age or medical condition  Airway Management Planned: Oral ETT  Additional Equipment:   Intra-op Plan:   Post-operative Plan: Extubation in OR  Informed Consent: I have reviewed the patients History and Physical, chart, labs and discussed the procedure including the risks, benefits and alternatives for the proposed anesthesia with the patient or authorized representative who has indicated his/her understanding and acceptance.     Dental advisory given  Plan Discussed with: CRNA  Anesthesia  Plan Comments:         Anesthesia Quick Evaluation

## 2019-02-17 ENCOUNTER — Encounter (HOSPITAL_COMMUNITY): Payer: Self-pay | Admitting: Interventional Radiology

## 2019-02-17 DIAGNOSIS — N2889 Other specified disorders of kidney and ureter: Secondary | ICD-10-CM | POA: Diagnosis not present

## 2019-02-17 DIAGNOSIS — Z7984 Long term (current) use of oral hypoglycemic drugs: Secondary | ICD-10-CM | POA: Diagnosis not present

## 2019-02-17 DIAGNOSIS — M1990 Unspecified osteoarthritis, unspecified site: Secondary | ICD-10-CM | POA: Diagnosis not present

## 2019-02-17 DIAGNOSIS — J45909 Unspecified asthma, uncomplicated: Secondary | ICD-10-CM | POA: Diagnosis not present

## 2019-02-17 DIAGNOSIS — Z79899 Other long term (current) drug therapy: Secondary | ICD-10-CM | POA: Diagnosis not present

## 2019-02-17 DIAGNOSIS — E119 Type 2 diabetes mellitus without complications: Secondary | ICD-10-CM | POA: Diagnosis not present

## 2019-02-17 DIAGNOSIS — Z7982 Long term (current) use of aspirin: Secondary | ICD-10-CM | POA: Diagnosis not present

## 2019-02-17 DIAGNOSIS — K760 Fatty (change of) liver, not elsewhere classified: Secondary | ICD-10-CM | POA: Diagnosis not present

## 2019-02-17 DIAGNOSIS — I10 Essential (primary) hypertension: Secondary | ICD-10-CM | POA: Diagnosis not present

## 2019-02-17 DIAGNOSIS — C641 Malignant neoplasm of right kidney, except renal pelvis: Secondary | ICD-10-CM | POA: Diagnosis not present

## 2019-02-17 LAB — BASIC METABOLIC PANEL
Anion gap: 7 (ref 5–15)
BUN: 12 mg/dL (ref 8–23)
CO2: 29 mmol/L (ref 22–32)
Calcium: 8.7 mg/dL — ABNORMAL LOW (ref 8.9–10.3)
Chloride: 103 mmol/L (ref 98–111)
Creatinine, Ser: 0.6 mg/dL (ref 0.44–1.00)
GFR calc Af Amer: >60 mL/min (ref >60)
GFR calc non Af Amer: >60 mL/min (ref >60)
Glucose, Bld: 224 mg/dL — ABNORMAL HIGH (ref 70–99)
Potassium: 4.2 mmol/L (ref 3.5–5.1)
Sodium: 139 mmol/L (ref 135–145)

## 2019-02-17 LAB — CBC
HCT: 31.8 % — ABNORMAL LOW (ref 36.0–46.0)
Hemoglobin: 9.5 g/dL — ABNORMAL LOW (ref 12.0–15.0)
MCH: 24.5 pg — ABNORMAL LOW (ref 26.0–34.0)
MCHC: 29.9 g/dL — ABNORMAL LOW (ref 30.0–36.0)
MCV: 82.2 fL (ref 80.0–100.0)
Platelets: 262 10*3/uL (ref 150–400)
RBC: 3.87 MIL/uL (ref 3.87–5.11)
RDW: 15.2 % (ref 11.5–15.5)
WBC: 9.1 10*3/uL (ref 4.0–10.5)
nRBC: 0 % (ref 0.0–0.2)

## 2019-02-17 NOTE — Plan of Care (Signed)
All goals met for d/c

## 2019-02-17 NOTE — Progress Notes (Signed)
Pt alert and oriented, tolerating diet. D/C instructions given, pt d/cd to home. 

## 2019-02-17 NOTE — Discharge Summary (Signed)
Patient ID: Kristine Zamora MRN: [id number] DOB/AGE: 01-06-1946 73 y.o.  Admit date: 02/16/2019 Discharge date: 02/17/2019  Supervising Physician: Daryll Brod  Patient Status: [facility] - In-pt  Admission Diagnoses: Renal cell cancer, right  Discharge Diagnoses:  Active Problems:   Renal cell cancer, right [facility])   Discharged Condition: stable  Hospital Course:  Patient presented to [facility] 02/16/2019 for a CT-guided cryoablation of right upper pole cortical renal mass by Dr. Annamaria Boots. Procedure occurred without major complications and patient was transferred to floor in stable condition (VSS, right flank incisions stable) for overnight observation. No major events occurred overnight.  Patient awake and alert laying in bed with no complaints at this time. States that she had two "pin heads" of blood in her urine post-procedure, one last night and one this AM- otherwise denies hematuria. Denies right flank plan. Plan to discharge home today and follow-up with Dr. Annamaria Boots in clinic 3-4 weeks after discharge.   Consults: None  Significant Diagnostic Studies: Dg Chest 2 View  Result Date: 01/19/2019 CLINICAL DATA:  Chest pain extending through to the back. EXAM: CHEST - 2 VIEW COMPARISON:  Two-view chest x-ray 12/25/2017 FINDINGS: The heart size is normal. Lung volumes are low. There is no edema or effusion. No focal airspace disease is present. The visualized soft tissues and bony thorax are unremarkable. IMPRESSION: No active cardiopulmonary disease. Electronically Signed   By: San Morelle M.D.   On: 01/19/2019 05:55   Ct Guide Tissue Ablation  Result Date: 02/16/2019 INDICATION: Enlarging right upper pole cortical renal lesion by MRI with enhancement compatible with cystic renal cell neoplasm now measuring up to 1.9 cm, previously 1 3 cm EXAM: CT-GUIDED PERCUTANEOUS CRYOABLATION OF THE RIGHT POLE RENAL MASS ANESTHESIA/SEDATION: General MEDICATIONS: ANCEF 2 G. The antibiotic was  administered in an appropriate time interval prior to needle puncture of the skin. CONTRAST:  47mL OMNIPAQUE IOHEXOL 350 MG/ML SOLN PROCEDURE: The procedure, risks, benefits, and alternatives were explained to the patient. Questions regarding the procedure were encouraged and answered. The patient understands and consents to the procedure. The patient was placed under general anesthesia. Initial unenhanced CT was performed in a left side down decubitus position. Position to localize the 1.9 cm right upper pole renal cortical lesion. The patient was prepped with ChloraPrep in a sterile fashion, and a sterile drape was applied covering the operative field. A sterile gown and sterile gloves were used for the procedure. Under CT guidance, a single 2.1 CX ice force percutaneous cryoablation probe was advanced into the right upper pole renal cortical lesion. Probe positioning was confirmed by CT prior to cryoablation. A single probe was centered within the small 1.9 cm lesion. Because of the needle position and small size there is now significant artifact which would limit additional needle placement for biopsy of the small renal neoplasm. Therefore biopsy was deferred. Cryoablation was performed through the single 2.1 CX ice force needle. Initial 10 minute cycle of cryoablation was performed. This was followed by a 5 minute thaw cycle. Prior to the second treatment, the needle was slightly advanced to cover more cortex from the tip of the ablation needle location. A second 10 minute cycle of cryoablation was then performed. During ablation, periodic CT imaging was performed to monitor ice ball formation and morphology. After active thaw, the cryoablation probe was removed. Post-procedural CT was performed. COMPLICATIONS: None immediate. FINDINGS: Imaging confirms single cryo ablation probe placed within the right upper pole renal cortical mass for successful ablation. Postprocedure imaging  demonstrates no surrounding  hemorrhage or complication. IMPRESSION: CT guided cryoablation of the right upper pole cortical renal mass compatible with renal neoplasm by MRI and demonstrating interval enlargement. The patient will be observed overnight. Initial follow-up will be performed in approximately 4 weeks. Electronically Signed   By: Jerilynn Mages.  Shick M.D.   On: 02/16/2019 16:57   Ir Radiologist Eval & Mgmt  Result Date: 01/19/2019 Please refer to notes tab for details about interventional procedure. (Op Note)   Treatments: CT-guided cryoablation of right upper pole cortical renal mass.  Discharge Exam: Blood pressure (!) 158/73, pulse 81, temperature 98.3 F (36.8 C), resp. rate (!) 21, height 5\' 1"  (1.549 m), SpO2 100 %. Physical Exam Vitals signs and nursing note reviewed.  Constitutional:      General: She is not in acute distress.    Appearance: Normal appearance.  Cardiovascular:     Rate and Rhythm: Normal rate and regular rhythm.     Heart sounds: Normal heart sounds. No murmur.  Pulmonary:     Effort: Pulmonary effort is normal. No respiratory distress.     Breath sounds: Normal breath sounds. No wheezing.  Abdominal:     Comments: Right flank incisions soft without tenderness, erythema, drainage, active bleeding, or hematoma.  Skin:    General: Skin is warm and dry.  Neurological:     Mental Status: She is alert and oriented to person, place, and time.  Psychiatric:        Mood and Affect: Mood normal.        Behavior: Behavior normal.        Thought Content: Thought content normal.        Judgment: Judgment normal.     Disposition: Discharge disposition: 01-Home or Self Care       Discharge Instructions    Call MD for:  difficulty breathing, headache or visual disturbances   Complete by: As directed    Call MD for:  extreme fatigue   Complete by: As directed    Call MD for:  hives   Complete by: As directed    Call MD for:  persistant dizziness or light-headedness   Complete by: As  directed    Call MD for:  persistant nausea and vomiting   Complete by: As directed    Call MD for:  redness, tenderness, or signs of infection (pain, swelling, redness, odor or green/yellow discharge around incision site)   Complete by: As directed    Call MD for:  severe uncontrolled pain   Complete by: As directed    Call MD for:  temperature >100.4   Complete by: As directed    Diet - low sodium heart healthy   Complete by: As directed    Discharge instructions   Complete by: As directed    No showering for 48 hours post-procedure. No submurging (bathing, swimming) for 7 days post-procedure.   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 She thinks it was something they sprayed them with.      Medication List    TAKE these medications   albuterol 108 (90 Base) MCG/ACT inhaler Commonly known as: VENTOLIN HFA Inhale 1-2 puffs into the lungs every 6 (six) hours as needed for wheezing or shortness  of breath.   aspirin 81 MG tablet Take 81 mg by mouth daily.   famotidine 20 MG tablet Commonly known as: Pepcid Take 1 tablet (20 mg total) by mouth 2 (two) times daily.   lisinopril 10 MG tablet Commonly known as: ZESTRIL Take 10 mg by mouth daily.   metFORMIN 1000 MG tablet Commonly known as: GLUCOPHAGE Take 1,000 mg by mouth 2 (two) times daily with a meal.   pantoprazole 20 MG tablet Commonly known as: PROTONIX Take 1 tablet (20 mg total) by mouth daily.   pravastatin 20 MG tablet Commonly known as: PRAVACHOL Take 20 mg by mouth at bedtime.      Follow-up Information    Greggory Keen, MD Follow up in 4 week(s).   Specialties: Interventional Radiology, Radiology Why: Please follow-up with Dr. Annamaria Boots in clinic or for tele-visit 3-4 weeks after discharge. Our office will call you  to set up this appointment. Contact information: [address] [place] [address] [phone]        Cleon Gustin, MD Follow up in 1 month(s).   Specialty: Urology Why: Please follow-up with Dr. Alyson Ingles within 1 month of procedure. Contact information: [address] [place] [address] [phone]            Electronically Signed: Earley Abide, PA-C 02/17/2019, 9:31 AM   I have spent Less Than 30 Minutes discharging Kristine Zamora.

## 2019-02-23 ENCOUNTER — Ambulatory Visit: Payer: Medicare Other | Admitting: Urology

## 2019-03-16 ENCOUNTER — Ambulatory Visit
Admission: RE | Admit: 2019-03-16 | Discharge: 2019-03-16 | Disposition: A | Discharge status: Home / Self Care | Payer: Medicare Other | Source: Ambulatory Visit | Attending: Student | Admitting: Student

## 2019-03-16 ENCOUNTER — Encounter: Payer: Self-pay | Admitting: *Deleted

## 2019-03-16 ENCOUNTER — Other Ambulatory Visit: Payer: Self-pay

## 2019-03-16 DIAGNOSIS — N2889 Other specified disorders of kidney and ureter: Secondary | ICD-10-CM | POA: Diagnosis not present

## 2019-03-16 DIAGNOSIS — C641 Malignant neoplasm of right kidney, except renal pelvis: Secondary | ICD-10-CM

## 2019-03-16 HISTORY — PX: IR RADIOLOGIST EVAL & MGMT: IMG5224

## 2019-03-16 NOTE — Progress Notes (Signed)
Patient ID: Kristine Zamora, female   DOB: September 04, 1945, 73 y.o.   MRN: [id number]       Chief Complaint:  Right renal mass concerning for renal neoplasm by MRI  Referring Physician(s): McKenzie  History of Present Illness: Kristine Zamora is a 73 y.o. female who is now status post successful cryoablation of a right renal mass.  Cryoablation performed at [facility] 02/16/2019.  She was undergoing previous surveillance imaging of a suspicious right renal mass originally diagnosed 2017.  The lesion was partially cystic with septation and enhancement.  By surveillance imaging there was slow interval enlargement up to 2 cm.  Imaging findings were characteristic for a low-grade cystic renal neoplasm by MRI.  Over the last month she has recovered at home very well.  She is now asymptomatic.  No flank or abdominal pain.  No interval fevers, illness, hematuria or dysuria.  No current urinary tract symptoms or signs of renal failure.  No interval imaging.  Past Medical History:  Diagnosis Date   Anemia    Arthritis    HANDS AND FEET   Asthma    Back pain    Diabetes mellitus without complication ([place])    TYPE 2   Dyspnea    with exersion    Hypertension    Lichen planus atrophicus    PONV (postoperative nausea and vomiting)    Right renal mass     Past Surgical History:  Procedure Laterality Date   BREAST SURGERY  1966   MILK TUBE REMOVED   COLONOSCOPY  2008   Dr. Gala Romney: pancolonic diverticula, hyperplastic rectosigmoid polyp   COLONOSCOPY N/A 01/16/2015   Procedure: COLONOSCOPY;  Surgeon: Daneil Dolin, MD;  Location: [facility];  Service: Endoscopy;  Laterality: N/A;  0900   IR RADIOLOGIST EVAL & MGMT  01/19/2019   RADIOLOGY WITH ANESTHESIA Right 02/16/2019   Procedure: CT CRYOABLATION RIGHT RENAL MASS;  Surgeon: Greggory Keen, MD;  Location: [facility] ORS;  Service: Anesthesiology;  Laterality: Right;   TONSILLECTOMY  1965    Allergies: Peach [prunus  persica]  Medications: Prior to Admission medications   Medication Sig Start Date End Date Taking? Authorizing Provider  albuterol (VENTOLIN HFA) 108 (90 Base) MCG/ACT inhaler Inhale 1-2 puffs into the lungs every 6 (six) hours as needed for wheezing or shortness of breath.    [provider]  aspirin 81 MG tablet Take 81 mg by mouth daily.    [provider]  famotidine (PEPCID) 20 MG tablet Take 1 tablet (20 mg total) by mouth 2 (two) times daily. Patient not taking: Reported on 02/04/2019 01/19/19   Mesner, Corene Cornea, MD  lisinopril (PRINIVIL,ZESTRIL) 10 MG tablet Take 10 mg by mouth daily.    [provider]  metFORMIN (GLUCOPHAGE) 1000 MG tablet Take 1,000 mg by mouth 2 (two) times daily with a meal. 10/11/18   [provider]  pantoprazole (PROTONIX) 20 MG tablet Take 1 tablet (20 mg total) by mouth daily. Patient not taking: Reported on 02/04/2019 01/19/19   Mesner, Corene Cornea, MD  pravastatin (PRAVACHOL) 20 MG tablet Take 20 mg by mouth at bedtime.     [provider]     Family History  Problem Relation Age of Onset   Alcohol abuse Mother        old age   Colon cancer Father        diagnosed in his early 27s   Breast cancer Daughter     Social History  Socioeconomic History   Marital status: Married    Spouse name: Not on file   Number of children: Not on file   Years of education: Not on file   Highest education level: Not on file  Occupational History   Not on file  Social Needs   Financial resource strain: Not on file   Food insecurity    Worry: Not on file    Inability: Not on file   Transportation needs    Medical: Not on file    Non-medical: Not on file  Tobacco Use   Smoking status: Former Smoker    Quit date: 12/21/1994    Years since quitting: 24.2   Smokeless tobacco: Never Used  Substance and Sexual Activity   Alcohol use: No    Alcohol/week: 0.0 standard drinks    Comment: quit drinking about 1996    Drug use: No   Sexual activity: Never  Lifestyle   Physical activity    Days per week: Not on file    Minutes per session: Not on file   Stress: Not on file  Relationships   Social connections    Talks on phone: Not on file    Gets together: Not on file    Attends religious service: Not on file    Active member of club or organization: Not on file    Attends meetings of clubs or organizations: Not on file    Relationship status: Not on file  Other Topics Concern   Not on file  Social History Narrative   Not on file     Review of Systems  Review of Systems: A 12 point ROS discussed and pertinent positives are indicated in the HPI above.  All other systems are negative.  Physical Exam No direct physical exam was performed, telephone visit only today.  Vital Signs: There were no vitals taken for this visit.  Imaging: Ct Guide Tissue Ablation  Result Date: 02/16/2019 INDICATION: Enlarging right upper pole cortical renal lesion by MRI with enhancement compatible with cystic renal cell neoplasm now measuring up to 1.9 cm, previously 1 3 cm EXAM: CT-GUIDED PERCUTANEOUS CRYOABLATION OF THE RIGHT POLE RENAL MASS ANESTHESIA/SEDATION: General MEDICATIONS: ANCEF 2 G. The antibiotic was administered in an appropriate time interval prior to needle puncture of the skin. CONTRAST:  60mL OMNIPAQUE IOHEXOL 350 MG/ML SOLN PROCEDURE: The procedure, risks, benefits, and alternatives were explained to the patient. Questions regarding the procedure were encouraged and answered. The patient understands and consents to the procedure. The patient was placed under general anesthesia. Initial unenhanced CT was performed in a left side down decubitus position. Position to localize the 1.9 cm right upper pole renal cortical lesion. The patient was prepped with ChloraPrep in a sterile fashion, and a sterile drape was applied covering the operative field. A sterile gown and sterile gloves were used for the  procedure. Under CT guidance, a single 2.1 CX ice force percutaneous cryoablation probe was advanced into the right upper pole renal cortical lesion. Probe positioning was confirmed by CT prior to cryoablation. A single probe was centered within the small 1.9 cm lesion. Because of the needle position and small size there is now significant artifact which would limit additional needle placement for biopsy of the small renal neoplasm. Therefore biopsy was deferred. Cryoablation was performed through the single 2.1 CX ice force needle. Initial 10 minute cycle of cryoablation was performed. This was followed by a 5 minute thaw cycle. Prior to the second treatment, the  needle was slightly advanced to cover more cortex from the tip of the ablation needle location. A second 10 minute cycle of cryoablation was then performed. During ablation, periodic CT imaging was performed to monitor ice ball formation and morphology. After active thaw, the cryoablation probe was removed. Post-procedural CT was performed. COMPLICATIONS: None immediate. FINDINGS: Imaging confirms single cryo ablation probe placed within the right upper pole renal cortical mass for successful ablation. Postprocedure imaging demonstrates no surrounding hemorrhage or complication. IMPRESSION: CT guided cryoablation of the right upper pole cortical renal mass compatible with renal neoplasm by MRI and demonstrating interval enlargement. The patient will be observed overnight. Initial follow-up will be performed in approximately 4 weeks. Electronically Signed   By: Jerilynn Mages.  Panzy Bubeck M.D.   On: 02/16/2019 16:57    Labs:  CBC: Recent Labs    01/19/19 0455 02/10/19 1410 02/17/19 0313  WBC 7.2 8.4 9.1  HGB 10.1* 10.9* 9.5*  HCT 33.5* 37.3 31.8*  PLT 277 293 262    COAGS: Recent Labs    02/10/19 1410  INR 1.0  APTT 26    BMP: Recent Labs    01/19/19 0455 02/10/19 1410 02/17/19 0313  NA 139 140 139  K 3.7 3.9 4.2  CL 102 99 103  CO2 27 27 29    GLUCOSE 173* 151* 224*  BUN 9 14 12   CALCIUM 9.3 9.0 8.7*  CREATININE 0.47 0.54 0.60  GFRNONAA >60 >60 >60  GFRAA >60 >60 >60    LIVER FUNCTION TESTS: Recent Labs    01/19/19 0455  BILITOT 0.5  AST 16  ALT 14  ALKPHOS 61  PROT 6.3*  ALBUMIN 3.4*    TUMOR MARKERS: No results for input(s): AFPTM, CEA, CA199, CHROMGRNA in the last 8760 hours.  Assessment and Plan:  1 month status post successful CT-guided cryoablation of the 2 cm suspicious right renal mass concerning for renal cell carcinoma by imaging.  She has recovered fully from the procedure at this point and doing very well.  Plan: Follow-up CT without and with contrast in 3 months (January 2021).    Electronically Signed: Greggory Keen 03/16/2019, 1:57 PM   I spent a total of    25 Minutes in remote  clinical consultation, greater than 50% of which was counseling/coordinating care for this patient with a right renal neoplasm.    Visit type: Audio only (telephone). Audio (no video) only due to patient's lack of internet/smartphone capability. Alternative for in-person consultation at [facility] [address] [place], [place], [place]. This visit type was conducted due to national recommendations for restrictions regarding the COVID-19 Pandemic (e.g. social distancing).  This format is felt to be most appropriate for this patient at this time.  All issues noted in this document were discussed and addressed.

## 2019-03-28 DIAGNOSIS — R5383 Other fatigue: Secondary | ICD-10-CM | POA: Diagnosis not present

## 2019-03-28 DIAGNOSIS — M1991 Primary osteoarthritis, unspecified site: Secondary | ICD-10-CM | POA: Diagnosis not present

## 2019-03-28 DIAGNOSIS — C641 Malignant neoplasm of right kidney, except renal pelvis: Secondary | ICD-10-CM | POA: Diagnosis not present

## 2019-03-28 DIAGNOSIS — K219 Gastro-esophageal reflux disease without esophagitis: Secondary | ICD-10-CM | POA: Diagnosis not present

## 2019-03-28 DIAGNOSIS — I10 Essential (primary) hypertension: Secondary | ICD-10-CM | POA: Diagnosis not present

## 2019-04-02 DIAGNOSIS — E119 Type 2 diabetes mellitus without complications: Secondary | ICD-10-CM | POA: Diagnosis not present

## 2019-04-02 DIAGNOSIS — Z7984 Long term (current) use of oral hypoglycemic drugs: Secondary | ICD-10-CM | POA: Diagnosis not present

## 2019-04-02 DIAGNOSIS — I10 Essential (primary) hypertension: Secondary | ICD-10-CM | POA: Diagnosis not present

## 2019-04-02 DIAGNOSIS — Z79899 Other long term (current) drug therapy: Secondary | ICD-10-CM | POA: Diagnosis not present

## 2019-04-13 ENCOUNTER — Ambulatory Visit (INDEPENDENT_AMBULATORY_CARE_PROVIDER_SITE_OTHER): Payer: Medicare Other | Admitting: Urology

## 2019-04-13 DIAGNOSIS — D3000 Benign neoplasm of unspecified kidney: Secondary | ICD-10-CM | POA: Diagnosis not present

## 2019-04-19 ENCOUNTER — Other Ambulatory Visit (HOSPITAL_COMMUNITY): Payer: Self-pay | Admitting: Internal Medicine

## 2019-04-21 ENCOUNTER — Other Ambulatory Visit (HOSPITAL_COMMUNITY): Payer: Self-pay | Admitting: Internal Medicine

## 2019-04-21 DIAGNOSIS — Z1231 Encounter for screening mammogram for malignant neoplasm of breast: Secondary | ICD-10-CM

## 2019-04-22 DIAGNOSIS — N644 Mastodynia: Secondary | ICD-10-CM | POA: Diagnosis not present

## 2019-04-22 DIAGNOSIS — I10 Essential (primary) hypertension: Secondary | ICD-10-CM | POA: Diagnosis not present

## 2019-04-22 DIAGNOSIS — G629 Polyneuropathy, unspecified: Secondary | ICD-10-CM | POA: Diagnosis not present

## 2019-04-22 DIAGNOSIS — E119 Type 2 diabetes mellitus without complications: Secondary | ICD-10-CM | POA: Diagnosis not present

## 2019-06-01 ENCOUNTER — Other Ambulatory Visit: Payer: Self-pay | Admitting: Interventional Radiology

## 2019-06-01 DIAGNOSIS — N2889 Other specified disorders of kidney and ureter: Secondary | ICD-10-CM

## 2019-06-13 ENCOUNTER — Ambulatory Visit (HOSPITAL_COMMUNITY)
Admission: RE | Admit: 2019-06-13 | Discharge: 2019-06-13 | Disposition: A | Discharge status: Home / Self Care | Payer: Medicare Other | Source: Ambulatory Visit | Attending: Interventional Radiology | Admitting: Interventional Radiology

## 2019-06-13 ENCOUNTER — Other Ambulatory Visit: Payer: Self-pay

## 2019-06-13 ENCOUNTER — Inpatient Hospital Stay: Admission: RE | Admit: 2019-06-13 | Payer: Medicare Other | Source: Ambulatory Visit

## 2019-06-13 DIAGNOSIS — N2889 Other specified disorders of kidney and ureter: Secondary | ICD-10-CM | POA: Diagnosis not present

## 2019-06-13 LAB — POCT I-STAT CREATININE: Creatinine, Ser: 0.6 mg/dL (ref 0.44–1.00)

## 2019-06-13 MED ORDER — IOHEXOL 300 MG/ML  SOLN
100.0000 mL | Freq: Once | INTRAMUSCULAR | Status: AC | PRN
  Administered 2019-06-13: 14:00:00 100 mL via INTRAVENOUS

## 2019-06-13 MED ORDER — IOHEXOL 300 MG/ML  SOLN: 100.0000 mL | Freq: Once | INTRAMUSCULAR | Status: DC | PRN

## 2019-06-15 ENCOUNTER — Encounter: Payer: Self-pay | Admitting: *Deleted

## 2019-06-15 ENCOUNTER — Ambulatory Visit
Admission: RE | Admit: 2019-06-15 | Discharge: 2019-06-15 | Disposition: A | Discharge status: Home / Self Care | Payer: Medicare Other | Source: Ambulatory Visit | Attending: Interventional Radiology | Admitting: Interventional Radiology

## 2019-06-15 ENCOUNTER — Other Ambulatory Visit: Payer: Self-pay

## 2019-06-15 DIAGNOSIS — N2889 Other specified disorders of kidney and ureter: Secondary | ICD-10-CM | POA: Diagnosis not present

## 2019-06-15 HISTORY — PX: IR RADIOLOGIST EVAL & MGMT: IMG5224

## 2019-06-15 NOTE — Progress Notes (Signed)
Patient ID: Kristine Zamora, female   DOB: 07-14-1945, 74 y.o.   MRN: [id number]        Chief Complaint:  Right renal mass concerning for renal neoplasm by MRI, status post ablation  Referring Physician(s): McKenzie  History of Present Illness: Kristine Zamora is a 74 y.o. female who is now 4 months status post successful cryoablation of a small right renal mass.  Lesion was concerning for a renal cell carcinoma by imaging criteria.  Ablation performed successfully at [facility] long hospital 02/16/2019.  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  Since treatment she has been at home and recovered very well.  She remains asymptomatic.  No flank or abdominal pain.  No recent illness or fever, dysuria or hematuria.  Today's visit is a telehealth visit because of Covid pandemic.  She has had recent imaging to review.  Past Medical History:  Diagnosis Date  . Anemia   . Arthritis    HANDS AND FEET  . Asthma   . Back pain   . Diabetes mellitus without complication ([place])    TYPE 2  . Dyspnea    with exersion   . Hypertension   . Lichen planus atrophicus   . PONV (postoperative nausea and vomiting)   . Right renal mass     Past Surgical History:  Procedure Laterality Date  . BREAST SURGERY  1966   MILK TUBE REMOVED  . COLONOSCOPY  2008   Dr. Gala Romney: pancolonic diverticula, hyperplastic rectosigmoid polyp  . COLONOSCOPY N/A 01/16/2015   Procedure: COLONOSCOPY;  Surgeon: Daneil Dolin, MD;  [facility];  Service: Endoscopy;  Laterality: N/A;  0900  . IR RADIOLOGIST EVAL & MGMT  01/19/2019  . IR RADIOLOGIST EVAL & MGMT  03/16/2019  . RADIOLOGY WITH ANESTHESIA Right 02/16/2019   Procedure: CT CRYOABLATION RIGHT RENAL MASS;  Surgeon: Greggory Keen, MD;  Location: [facility];  Service: Anesthesiology;  Laterality: Right;  . TONSILLECTOMY  1965    Allergies: Peach [prunus  persica]  Medications: Prior to Admission medications   Medication Sig Start Date End Date Taking? Authorizing Provider  albuterol (VENTOLIN HFA) 108 (90 Base) MCG/ACT inhaler Inhale 1-2 puffs into the lungs every 6 (six) hours as needed for wheezing or shortness of breath.    [provider]  aspirin 81 MG tablet Take 81 mg by mouth daily.    [provider]  famotidine (PEPCID) 20 MG tablet Take 1 tablet (20 mg total) by mouth 2 (two) times daily. Patient not taking: Reported on 02/04/2019 01/19/19   Mesner, Corene Cornea, MD  lisinopril (PRINIVIL,ZESTRIL) 10 MG tablet Take 10 mg by mouth daily.    [provider]  metFORMIN (GLUCOPHAGE) 1000 MG tablet Take 1,000 mg by mouth 2 (two) times daily with a meal. 10/11/18   [provider]  pantoprazole (PROTONIX) 20 MG tablet Take 1 tablet (20 mg total) by mouth daily. Patient not taking: Reported on 02/04/2019 01/19/19   Mesner, Corene Cornea, MD  pravastatin (PRAVACHOL) 20 MG tablet Take 20 mg by mouth at bedtime.     [provider]     Family History  Problem Relation Age of Onset  . Alcohol abuse Mother        old age  . Colon cancer Father        diagnosed in his early 30s  . Breast cancer Daughter     Social History  Socioeconomic History  . Marital status: Married    Spouse name: Not on file  . Number of children: Not on file  . Years of education: Not on file  . Highest education level: Not on file  Occupational History  . Not on file  Tobacco Use  . Smoking status: Former Smoker    Quit date: 12/21/1994    Years since quitting: 24.4  . Smokeless tobacco: Never Used  Substance and Sexual Activity  . Alcohol use: No    Alcohol/week: 0.0 standard drinks    Comment: quit drinking about 1996  . Drug use: No  . Sexual activity: Never  Other Topics Concern  . Not on file  Social History Narrative  . Not on file   Social Determinants of Health   Financial Resource Strain:   . Difficulty of  Paying Living Expenses: Not on file  Food Insecurity:   . Worried About Charity fundraiser in the Last Year: Not on file  . Ran Out of Food in the Last Year: Not on file  Transportation Needs:   . Lack of Transportation (Medical): Not on file  . Lack of Transportation (Non-Medical): Not on file  Physical Activity:   . Days of Exercise per Week: Not on file  . Minutes of Exercise per Session: Not on file  Stress:   . Feeling of Stress : Not on file  Social Connections:   . Frequency of Communication with Friends and Family: Not on file  . Frequency of Social Gatherings with Friends and Family: Not on file  . Attends Religious Services: Not on file  . Active Member of Clubs or Organizations: Not on file  . Attends Archivist Meetings: Not on file  . Marital Status: Not on file    Review of Systems  Review of Systems: A 12 point ROS discussed and pertinent positives are indicated in the HPI above.  All other systems are negative.  Physical Exam No direct physical exam was performed, telephone visit only because of Covid pandemic Vital Signs: There were no vitals taken for this visit.  Imaging: CT ABDOMEN W WO CONTRAST  Result Date: 06/13/2019 CLINICAL DATA:  Follow-up of right renal lesion, status post cryoablation. EXAM: CT ABDOMEN WITHOUT AND WITH CONTRAST TECHNIQUE: Multidetector CT imaging of the abdomen was performed following the standard protocol before and following the bolus administration of intravenous contrast. CONTRAST:  163mL OMNIPAQUE IOHEXOL 300 MG/ML  SOLN COMPARISON:  12/30/2018 MRI.  Correlation images from 02/16/2019 FINDINGS: Lower chest: Clear lung bases. Normal heart size without pericardial or pleural effusion. Hepatobiliary: Hepatic steatosis, with left hepatic cysts. Normal gallbladder, without biliary ductal dilatation. Pancreas: Normal, without mass or ductal dilatation. Spleen: Normal in size, without focal abnormality. Adrenals/Urinary Tract:  Normal adrenal glands. Upper pole punctate right renal collecting system calculus. No hydronephrosis. Soft tissue density portion of the ablation defect in the anterior upper pole right kidney measures 1.5 cm on 45/6. Apparent mild increased density after contrast on axial image 46/11, not confirmed on coronal image 68/14. Surrounding interstitial thickening is likely treatment related. Posterior upper pole 4.7 cm exophytic right renal cyst. Left renal too small to characterize lesions. No hydronephrosis. Stomach/Bowel: Normal stomach, without wall thickening. Normal colon, appendix, and terminal ileum. Normal abdominal small bowel. Vascular/Lymphatic: Aortic atherosclerosis. Patent renal veins. No abdominal adenopathy. Other: No ascites. Musculoskeletal: Osteopenia.  No acute osseous abnormality. IMPRESSION: 1. Interval ablation at the site of the anterior upper pole right renal lesion. The  residual soft tissue density in this region is smaller than the lesion on prior MRI, likely due to blood products and evolving scar. Apparent mild increased density after contrast administration on axial images is not confirmed on coronal reformats. This can serve as a baseline for follow-up. 2. No evidence of metastatic disease. 3. Hepatic steatosis. 4. Right nephrolithiasis. 5.  Aortic Atherosclerosis (ICD10-I70.0). Electronically Signed   By: Abigail Miyamoto M.D.   On: 06/13/2019 15:10    Labs:  CBC: Recent Labs    01/19/19 0455 02/10/19 1410 02/17/19 0313  WBC 7.2 8.4 9.1  HGB 10.1* 10.9* 9.5*  HCT 33.5* 37.3 31.8*  PLT 277 293 262    COAGS: Recent Labs    02/10/19 1410  INR 1.0  APTT 26    BMP: Recent Labs    01/19/19 0455 02/10/19 1410 02/17/19 0313 06/13/19 1428  NA 139 140 139  --   K 3.7 3.9 4.2  --   CL 102 99 103  --   CO2 27 27 29   --   GLUCOSE 173* 151* 224*  --   BUN 9 14 12   --   CALCIUM 9.3 9.0 8.7*  --   CREATININE 0.47 0.54 0.60 0.60  GFRNONAA >60 >60 >60  --   GFRAA >60  >60 >60  --     LIVER FUNCTION TESTS: Recent Labs    01/19/19 0455  BILITOT 0.5  AST 16  ALT 14  ALKPHOS 61  PROT 6.3*  ALBUMIN 3.4*    TUMOR MARKERS: No results for input(s): AFPTM, CEA, CA199, CHROMGRNA in the last 8760 hours.  Assessment and Plan:  64-month status post successful CT-guided cryoablation of a 2 cm suspicious right renal mass for renal cell carcinoma by imaging.  Surveillance CT demonstrates interval ablation at the anterior upper pole of the right kidney with no significant residual enhancement to suggest residual tumor at this time.  This scan will serve as a new baseline for continued surveillance.  No delayed complication or hematoma.  No significant kidney abnormality by CT.  Nonobstructing nephrolithiasis and renal cyst again noted.  Plan: Continue outpatient surveillance CT at 6 months (post ablation protocol).  This can be performed at [facility] and reviewed remotely.  Electronically Signed: Greggory Keen 06/15/2019, 11:41 AM   I spent a total of    25 Minutes in remote  clinical consultation, greater than 50% of which was counseling/coordinating care for this patient status post right renal mass cryoablation.    Visit type: Audio only (telephone). Audio (no video) only due to patient's lack of internet/smartphone capability. Alternative for in-person consultation at [facility], [address] [place], [place], [place]. This visit type was conducted due to national recommendations for restrictions regarding the COVID-19 Pandemic (e.g. social distancing).  This format is felt to be most appropriate for this patient at this time.  All issues noted in this document were discussed and addressed.

## 2019-06-16 ENCOUNTER — Ambulatory Visit (HOSPITAL_COMMUNITY)
Admission: RE | Admit: 2019-06-16 | Discharge: 2019-06-16 | Disposition: A | Discharge status: Home / Self Care | Payer: Medicare Other | Source: Ambulatory Visit | Attending: Internal Medicine | Admitting: Internal Medicine

## 2019-06-16 ENCOUNTER — Other Ambulatory Visit: Payer: Self-pay

## 2019-06-16 DIAGNOSIS — Z1231 Encounter for screening mammogram for malignant neoplasm of breast: Secondary | ICD-10-CM | POA: Diagnosis not present

## 2019-10-12 ENCOUNTER — Ambulatory Visit: Payer: Medicare Other | Admitting: Urology

## 2019-11-01 DIAGNOSIS — E119 Type 2 diabetes mellitus without complications: Secondary | ICD-10-CM | POA: Diagnosis not present

## 2019-11-01 DIAGNOSIS — J4520 Mild intermittent asthma, uncomplicated: Secondary | ICD-10-CM | POA: Diagnosis not present

## 2019-11-01 DIAGNOSIS — K219 Gastro-esophageal reflux disease without esophagitis: Secondary | ICD-10-CM | POA: Diagnosis not present

## 2019-11-01 DIAGNOSIS — E538 Deficiency of other specified B group vitamins: Secondary | ICD-10-CM | POA: Diagnosis not present

## 2019-11-01 DIAGNOSIS — E7849 Other hyperlipidemia: Secondary | ICD-10-CM | POA: Diagnosis not present

## 2019-11-01 DIAGNOSIS — I10 Essential (primary) hypertension: Secondary | ICD-10-CM | POA: Diagnosis not present

## 2019-11-01 DIAGNOSIS — M5412 Radiculopathy, cervical region: Secondary | ICD-10-CM | POA: Diagnosis not present

## 2019-11-01 DIAGNOSIS — D649 Anemia, unspecified: Secondary | ICD-10-CM | POA: Diagnosis not present

## 2019-11-01 DIAGNOSIS — Z1389 Encounter for screening for other disorder: Secondary | ICD-10-CM | POA: Diagnosis not present

## 2019-11-01 DIAGNOSIS — Z0001 Encounter for general adult medical examination with abnormal findings: Secondary | ICD-10-CM | POA: Diagnosis not present

## 2019-11-01 DIAGNOSIS — E559 Vitamin D deficiency, unspecified: Secondary | ICD-10-CM | POA: Diagnosis not present

## 2019-11-02 ENCOUNTER — Other Ambulatory Visit (HOSPITAL_COMMUNITY): Payer: Self-pay | Admitting: Internal Medicine

## 2019-11-02 DIAGNOSIS — E2839 Other primary ovarian failure: Secondary | ICD-10-CM

## 2019-11-10 ENCOUNTER — Other Ambulatory Visit: Payer: Self-pay | Admitting: *Deleted

## 2019-11-10 NOTE — Patient Outreach (Signed)
[facility]) Care Management  11/10/2019  Kristine Zamora Oct 29, 1945 [id number]   RN Health Coach screening call to patient. [employer] compliance . RN described the goal of program to help maintain your health through education by sharing information and providing support. RN described the how the Registered nurse can give you information on your health conditions. The social worker can help with community services and the Pharmacy can help you to understand your medications and /or assist you with resources to get medicines. Patient declined services at this time and did not feel she needed any.  Plan: Screening closure.  Patient did receive the [facility] brochure  [place] [facility] [phone]

## 2019-11-22 DIAGNOSIS — E538 Deficiency of other specified B group vitamins: Secondary | ICD-10-CM | POA: Diagnosis not present

## 2019-11-22 DIAGNOSIS — L821 Other seborrheic keratosis: Secondary | ICD-10-CM | POA: Diagnosis not present

## 2019-11-22 DIAGNOSIS — B078 Other viral warts: Secondary | ICD-10-CM | POA: Diagnosis not present

## 2019-12-01 ENCOUNTER — Other Ambulatory Visit: Payer: Self-pay | Admitting: Interventional Radiology

## 2019-12-01 DIAGNOSIS — N2889 Other specified disorders of kidney and ureter: Secondary | ICD-10-CM

## 2019-12-21 ENCOUNTER — Ambulatory Visit (HOSPITAL_COMMUNITY)
Admission: RE | Admit: 2019-12-21 | Discharge: 2019-12-21 | Disposition: A | Discharge status: Home / Self Care | Payer: Medicare Other | Source: Ambulatory Visit | Attending: Interventional Radiology | Admitting: Interventional Radiology

## 2019-12-21 ENCOUNTER — Other Ambulatory Visit: Payer: Self-pay

## 2019-12-21 DIAGNOSIS — I700 Atherosclerosis of aorta: Secondary | ICD-10-CM | POA: Diagnosis not present

## 2019-12-21 DIAGNOSIS — D510 Vitamin B12 deficiency anemia due to intrinsic factor deficiency: Secondary | ICD-10-CM | POA: Diagnosis not present

## 2019-12-21 DIAGNOSIS — N2889 Other specified disorders of kidney and ureter: Secondary | ICD-10-CM | POA: Insufficient documentation

## 2019-12-21 DIAGNOSIS — K760 Fatty (change of) liver, not elsewhere classified: Secondary | ICD-10-CM | POA: Diagnosis not present

## 2019-12-21 DIAGNOSIS — K7689 Other specified diseases of liver: Secondary | ICD-10-CM | POA: Diagnosis not present

## 2019-12-21 DIAGNOSIS — N200 Calculus of kidney: Secondary | ICD-10-CM | POA: Diagnosis not present

## 2019-12-21 LAB — POCT I-STAT CREATININE: Creatinine, Ser: 0.6 mg/dL (ref 0.44–1.00)

## 2019-12-21 MED ORDER — IOHEXOL 300 MG/ML  SOLN
100.0000 mL | Freq: Once | INTRAMUSCULAR | Status: AC | PRN
  Administered 2019-12-21: 100 mL via INTRAVENOUS

## 2019-12-22 ENCOUNTER — Encounter: Payer: Self-pay | Admitting: *Deleted

## 2019-12-22 ENCOUNTER — Ambulatory Visit
Admission: RE | Admit: 2019-12-22 | Discharge: 2019-12-22 | Disposition: A | Discharge status: Home / Self Care | Payer: Medicare Other | Source: Ambulatory Visit | Attending: Interventional Radiology | Admitting: Interventional Radiology

## 2019-12-22 DIAGNOSIS — N2889 Other specified disorders of kidney and ureter: Secondary | ICD-10-CM

## 2019-12-22 DIAGNOSIS — Z9889 Other specified postprocedural states: Secondary | ICD-10-CM | POA: Diagnosis not present

## 2019-12-22 HISTORY — PX: IR RADIOLOGIST EVAL & MGMT: IMG5224

## 2019-12-22 NOTE — Progress Notes (Signed)
Patient ID: Kristine Zamora, female   DOB: 1946-01-16, 74 y.o.   MRN: [id number]       Chief Complaint:  Small right renal mass  Referring Physician(s): Cyndie Chime  History of Present Illness: Kristine Zamora is a 74 y.o. female who is now 46-month status post image guided cryoablation of a small right renal mass.  Lesion is compatible with renal cell carcinoma by imaging criteria.  Cryoablation performed 02/16/2019 at [facility].  She has had interval surveillance imaging.  Surveillance imaging is stable demonstrating further retraction of the right renal ablation site.  No evidence of residual or recurrent disease.  No new or acute process.  She remains asymptomatic.  No significant flank pain abdominal pain.  No recent illness, fever, dysuria hematuria.  No other urinary tract symptoms.  Today's visit is a telehealth visit because of Covid pandemic.  Overall she is at her baseline and doing well.  Past Medical History:  Diagnosis Date  . Anemia   . Arthritis    HANDS AND FEET  . Asthma   . Back pain   . Diabetes mellitus without complication ([place])    TYPE 2  . Dyspnea    with exersion   . Hypertension   . Lichen planus atrophicus   . PONV (postoperative nausea and vomiting)   . Right renal mass     Past Surgical History:  Procedure Laterality Date  . BREAST SURGERY  1966   MILK TUBE REMOVED  . COLONOSCOPY  2008   Dr. Gala Romney: pancolonic diverticula, hyperplastic rectosigmoid polyp  . COLONOSCOPY N/A 01/16/2015   Procedure: COLONOSCOPY;  Surgeon: Daneil Dolin, MD;  [facility];  Service: Endoscopy;  Laterality: N/A;  0900  . IR RADIOLOGIST EVAL & MGMT  01/19/2019  . IR RADIOLOGIST EVAL & MGMT  03/16/2019  . IR RADIOLOGIST EVAL & MGMT  06/15/2019  . RADIOLOGY WITH ANESTHESIA Right 02/16/2019   Procedure: CT CRYOABLATION RIGHT RENAL MASS;  Surgeon: Greggory Keen, MD;  Location: [facility];  Service: Anesthesiology;  Laterality: Right;  . TONSILLECTOMY  1965     Allergies: Peach [prunus persica]  Medications: Prior to Admission medications   Medication Sig Start Date End Date Taking? Authorizing Provider  albuterol (VENTOLIN HFA) 108 (90 Base) MCG/ACT inhaler Inhale 1-2 puffs into the lungs every 6 (six) hours as needed for wheezing or shortness of breath.    [provider]  aspirin 81 MG tablet Take 81 mg by mouth daily.    [provider]  famotidine (PEPCID) 20 MG tablet Take 1 tablet (20 mg total) by mouth 2 (two) times daily. Patient not taking: Reported on 02/04/2019 01/19/19   Mesner, Corene Cornea, MD  lisinopril (PRINIVIL,ZESTRIL) 10 MG tablet Take 10 mg by mouth daily.    [provider]  metFORMIN (GLUCOPHAGE) 1000 MG tablet Take 1,000 mg by mouth 2 (two) times daily with a meal. 10/11/18   [provider]  pantoprazole (PROTONIX) 20 MG tablet Take 1 tablet (20 mg total) by mouth daily. Patient not taking: Reported on 02/04/2019 01/19/19   Mesner, Corene Cornea, MD  pravastatin (PRAVACHOL) 20 MG tablet Take 20 mg by mouth at bedtime.     [provider]     Family History  Problem Relation Age of Onset  . Alcohol abuse Mother        old age  . Colon cancer Father        diagnosed in his early 74s  . Breast  cancer Daughter     Social History   Socioeconomic History  . Marital status: Married    Spouse name: Not on file  . Number of children: Not on file  . Years of education: Not on file  . Highest education level: Not on file  Occupational History  . Not on file  Tobacco Use  . Smoking status: Former Smoker    Quit date: 12/21/1994    Years since quitting: 25.0  . Smokeless tobacco: Never Used  Vaping Use  . Vaping Use: Never used  Substance and Sexual Activity  . Alcohol use: No    Alcohol/week: 0.0 standard drinks    Comment: quit drinking about 1996  . Drug use: No  . Sexual activity: Never  Other Topics Concern  . Not on file  Social History Narrative  . Not on file   Social  Determinants of Health   Financial Resource Strain:   . Difficulty of Paying Living Expenses:   Food Insecurity:   . Worried About Charity fundraiser in the Last Year:   . Arboriculturist in the Last Year:   Transportation Needs:   . Film/video editor (Medical):   Marland Kitchen Lack of Transportation (Non-Medical):   Physical Activity:   . Days of Exercise per Week:   . Minutes of Exercise per Session:   Stress:   . Feeling of Stress :   Social Connections:   . Frequency of Communication with Friends and Family:   . Frequency of Social Gatherings with Friends and Family:   . Attends Religious Services:   . Active Member of Clubs or Organizations:   . Attends Archivist Meetings:   Marland Kitchen Marital Status:      Review of Systems  Review of Systems: A 12 point ROS discussed and pertinent positives are indicated in the HPI above.  All other systems are negative.  Physical Exam No direct physical exam was performed, telephone health visit only today because of Covid pandemic Vital Signs: There were no vitals taken for this visit.  Imaging: CT ABDOMEN W WO CONTRAST  Result Date: 12/21/2019 CLINICAL DATA:  74 year old female with history of renal cryo ablation on 02/16/2019. EXAM: CT ABDOMEN WITHOUT AND WITH CONTRAST TECHNIQUE: Multidetector CT imaging of the abdomen was performed following the standard protocol before and following the bolus administration of intravenous contrast. CONTRAST:  122mL OMNIPAQUE IOHEXOL 300 MG/ML  SOLN COMPARISON:  CT the abdomen and pelvis 06/13/2019. FINDINGS: Lower chest: Unremarkable. Hepatobiliary: Diffuse low attenuation throughout the hepatic parenchyma, indicative of hepatic steatosis. Low-attenuation lesions in the left lobe of the liver, largest of which is predominantly in segment 2 (axial image 27 of series 3) measuring 5.2 x 4.0 cm. No suspicious hepatic lesions. No intra or extrahepatic biliary ductal dilatation. Gallbladder is normal in  appearance. Pancreas: No pancreatic mass. No pancreatic ductal dilatation. No pancreatic or peripancreatic fluid collections or inflammatory changes. Spleen: Unremarkable. Adrenals/Urinary Tract: Postprocedural changes of prior ablation in the upper pole of the right kidney with some focal fat stranding and architectural distortion, very similar to the prior examination. No definite enhancing soft tissue nodularity in this region to suggest residual or locally recurrent disease. Exophytic 5.4 cm low-attenuation lesion in the upper pole of the right kidney, compatible with a simple cyst. Subcentimeter low-attenuation lesions in the left kidney, too small to characterize, but statistically likely to represent tiny cysts. 1-2 mm nonobstructive calculus in the interpolar collecting system of the right kidney.  No hydroureteronephrosis in the visualized portions of the abdomen. Bilateral adrenal glands are normal in appearance. Stomach/Bowel: Normal appearance of the stomach. No pathologic dilatation of visualized portions of small bowel or colon. A few scattered colonic diverticulae are noted, without surrounding inflammatory changes in the visualized portions of the abdomen. Vascular/Lymphatic: Aortic atherosclerosis, without evidence of aneurysm or dissection in the abdominal or pelvic vasculature. No lymphadenopathy noted in the abdomen. Other: No significant volume of ascites noted in the visualized portions of the peritoneal cavity. Musculoskeletal: No aggressive appearing osseous lesions are noted in the visualized portions of the skeleton. IMPRESSION: 1. Stable postprocedural changes of prior ablation in the upper pole the right kidney without findings to suggest residual or local recurrence of disease. No metastatic disease noted in the abdomen. 2. Low-attenuation lesions in the liver and upper pole the right kidney, compatible with simple cysts. 3. Hepatic steatosis. 4. Aortic atherosclerosis. 5. Additional  incidental findings, as above. Electronically Signed   By: Vinnie Langton M.D.   On: 12/21/2019 09:38    Labs:  CBC: Recent Labs    01/19/19 0455 02/10/19 1410 02/17/19 0313  WBC 7.2 8.4 9.1  HGB 10.1* 10.9* 9.5*  HCT 33.5* 37.3 31.8*  PLT 277 293 262    COAGS: Recent Labs    02/10/19 1410  INR 1.0  APTT 26    BMP: Recent Labs    01/19/19 0455 01/19/19 0455 02/10/19 1410 02/17/19 0313 06/13/19 1428 12/21/19 0855  NA 139  --  140 139  --   --   K 3.7  --  3.9 4.2  --   --   CL 102  --  99 103  --   --   CO2 27  --  27 29  --   --   GLUCOSE 173*  --  151* 224*  --   --   BUN 9  --  14 12  --   --   CALCIUM 9.3  --  9.0 8.7*  --   --   CREATININE 0.47   < > 0.54 0.60 0.60 0.60  GFRNONAA >60  --  >60 >60  --   --   GFRAA >60  --  >60 >60  --   --    < > = values in this interval not displayed.    LIVER FUNCTION TESTS: Recent Labs    01/19/19 0455  BILITOT 0.5  AST 16  ALT 14  ALKPHOS 61  PROT 6.3*  ALBUMIN 3.4*    TUMOR MARKERS: No results for input(s): AFPTM, CEA, CA199, CHROMGRNA in the last 8760 hours.  Assessment and Plan:  36-month status post CT-guided cryoablation of a 2 cm suspicious right renal mass compatible with renal cell carcinoma by imaging.  Surveillance imaging demonstrates further retraction of the ablation defect.  No signs of residual enhancement to suggest recurrence or residual disease.  Overall she is doing very well.  No other acute finding by CT.  Plan: Continue outpatient surveillance at 12 months (July 2022).  This can be performed at [facility].   Electronically Signed: Greggory Keen 12/22/2019, 1:22 PM   I spent a total of    25 Minutes in remote  clinical consultation, greater than 50% of which was counseling/coordinating care for this patient with right renal neoplasm status post ablation.    Visit type: Audio only (telephone). Audio (no video) only due to patient's lack of internet/smartphone  capability. Alternative for in-person consultation at [facility],  [address] [place], [place], [place]. This visit type was conducted due to national recommendations for restrictions regarding the COVID-19 Pandemic (e.g. social distancing).  This format is felt to be most appropriate for this patient at this time.  All issues noted in this document were discussed and addressed.

## 2020-06-20 DIAGNOSIS — Z681 Body mass index (BMI) 19 or less, adult: Secondary | ICD-10-CM | POA: Diagnosis not present

## 2020-06-20 DIAGNOSIS — J9801 Acute bronchospasm: Secondary | ICD-10-CM | POA: Diagnosis not present

## 2020-06-20 DIAGNOSIS — J329 Chronic sinusitis, unspecified: Secondary | ICD-10-CM | POA: Diagnosis not present

## 2020-06-20 DIAGNOSIS — E7849 Other hyperlipidemia: Secondary | ICD-10-CM | POA: Diagnosis not present

## 2020-08-14 ENCOUNTER — Other Ambulatory Visit (HOSPITAL_COMMUNITY): Payer: Self-pay | Admitting: Internal Medicine

## 2020-08-14 DIAGNOSIS — Z1231 Encounter for screening mammogram for malignant neoplasm of breast: Secondary | ICD-10-CM

## 2020-08-14 DIAGNOSIS — I10 Essential (primary) hypertension: Secondary | ICD-10-CM | POA: Diagnosis not present

## 2020-08-14 DIAGNOSIS — M1990 Unspecified osteoarthritis, unspecified site: Secondary | ICD-10-CM | POA: Diagnosis not present

## 2020-08-14 DIAGNOSIS — I700 Atherosclerosis of aorta: Secondary | ICD-10-CM | POA: Diagnosis not present

## 2020-08-14 DIAGNOSIS — E119 Type 2 diabetes mellitus without complications: Secondary | ICD-10-CM | POA: Diagnosis not present

## 2020-08-22 ENCOUNTER — Ambulatory Visit (HOSPITAL_COMMUNITY)
Admission: RE | Admit: 2020-08-22 | Discharge: 2020-08-22 | Disposition: A | Discharge status: Home / Self Care | Payer: Medicare Other | Source: Ambulatory Visit | Attending: Internal Medicine | Admitting: Internal Medicine

## 2020-08-22 ENCOUNTER — Other Ambulatory Visit: Payer: Self-pay

## 2020-08-22 DIAGNOSIS — Z1231 Encounter for screening mammogram for malignant neoplasm of breast: Secondary | ICD-10-CM | POA: Diagnosis not present

## 2020-11-28 ENCOUNTER — Other Ambulatory Visit: Payer: Self-pay | Admitting: Interventional Radiology

## 2020-11-28 DIAGNOSIS — N2889 Other specified disorders of kidney and ureter: Secondary | ICD-10-CM

## 2020-11-30 DIAGNOSIS — Z1389 Encounter for screening for other disorder: Secondary | ICD-10-CM | POA: Diagnosis not present

## 2020-11-30 DIAGNOSIS — E559 Vitamin D deficiency, unspecified: Secondary | ICD-10-CM | POA: Diagnosis not present

## 2020-11-30 DIAGNOSIS — M779 Enthesopathy, unspecified: Secondary | ICD-10-CM | POA: Diagnosis not present

## 2020-11-30 DIAGNOSIS — I10 Essential (primary) hypertension: Secondary | ICD-10-CM | POA: Diagnosis not present

## 2020-11-30 DIAGNOSIS — E782 Mixed hyperlipidemia: Secondary | ICD-10-CM | POA: Diagnosis not present

## 2020-11-30 DIAGNOSIS — Z0000 Encounter for general adult medical examination without abnormal findings: Secondary | ICD-10-CM | POA: Diagnosis not present

## 2020-11-30 DIAGNOSIS — E538 Deficiency of other specified B group vitamins: Secondary | ICD-10-CM | POA: Diagnosis not present

## 2020-11-30 DIAGNOSIS — M1991 Primary osteoarthritis, unspecified site: Secondary | ICD-10-CM | POA: Diagnosis not present

## 2020-11-30 DIAGNOSIS — Z0001 Encounter for general adult medical examination with abnormal findings: Secondary | ICD-10-CM | POA: Diagnosis not present

## 2020-11-30 DIAGNOSIS — E039 Hypothyroidism, unspecified: Secondary | ICD-10-CM | POA: Diagnosis not present

## 2020-12-07 DIAGNOSIS — D519 Vitamin B12 deficiency anemia, unspecified: Secondary | ICD-10-CM | POA: Diagnosis not present

## 2020-12-10 DIAGNOSIS — H524 Presbyopia: Secondary | ICD-10-CM | POA: Diagnosis not present

## 2020-12-10 DIAGNOSIS — E119 Type 2 diabetes mellitus without complications: Secondary | ICD-10-CM | POA: Diagnosis not present

## 2020-12-10 DIAGNOSIS — E1136 Type 2 diabetes mellitus with diabetic cataract: Secondary | ICD-10-CM | POA: Diagnosis not present

## 2020-12-10 DIAGNOSIS — H52203 Unspecified astigmatism, bilateral: Secondary | ICD-10-CM | POA: Diagnosis not present

## 2020-12-10 DIAGNOSIS — H2513 Age-related nuclear cataract, bilateral: Secondary | ICD-10-CM | POA: Diagnosis not present

## 2020-12-21 ENCOUNTER — Ambulatory Visit (HOSPITAL_COMMUNITY)
Admission: RE | Admit: 2020-12-21 | Discharge: 2020-12-21 | Disposition: A | Discharge status: Home / Self Care | Payer: Medicare Other | Source: Ambulatory Visit | Attending: Interventional Radiology | Admitting: Interventional Radiology

## 2020-12-21 ENCOUNTER — Other Ambulatory Visit: Payer: Self-pay

## 2020-12-21 DIAGNOSIS — D1800 Hemangioma unspecified site: Secondary | ICD-10-CM | POA: Diagnosis not present

## 2020-12-21 DIAGNOSIS — K5730 Diverticulosis of large intestine without perforation or abscess without bleeding: Secondary | ICD-10-CM | POA: Diagnosis not present

## 2020-12-21 DIAGNOSIS — N281 Cyst of kidney, acquired: Secondary | ICD-10-CM | POA: Diagnosis not present

## 2020-12-21 DIAGNOSIS — N2889 Other specified disorders of kidney and ureter: Secondary | ICD-10-CM | POA: Insufficient documentation

## 2020-12-21 LAB — POCT I-STAT CREATININE: Creatinine, Ser: 0.5 mg/dL (ref 0.44–1.00)

## 2020-12-21 IMAGING — MR MRI ABDOMEN WITH AND WITHOUT CONTRAST
11 of 17 series · 24 of 48 positions shown · IV contrast (Multihance)
Comparison: Multiple exams, including 04/11/2016

CLINICAL DATA: Renal lesion follow up

EXAM:
MRI ABDOMEN WITHOUT AND WITH CONTRAST
TECHNIQUE: Multiplanar multisequence MR imaging of the abdomen was performed
both before and after the administration of intravenous contrast.
CONTRAST:  20mL MULTIHANCE GADOBENATE DIMEGLUMINE 529 MG/ML IV SOLN

[Series 3: T2 · coronal · 5.0mm · 1.52mm/px · 1 of 31 slices shown (1 of 3)]
[im 1/31]
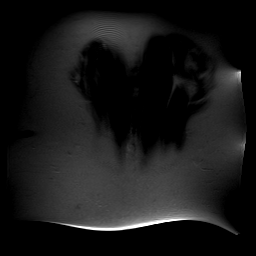

[Series 4: T2 · axial · 5.0mm · 1.52mm/px · 1 of 40 slices shown (2 of 3)]
[im 1/40]
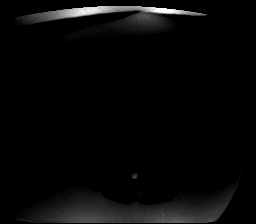

[Series 5: axial in out · axial · 5.5mm · 0.78mm/px · z∈[-108,+139]mm · 2 of 80 slices shown]
[im 1/80]
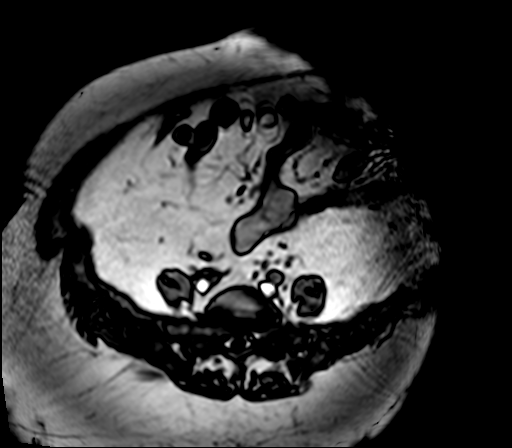
[im 80/80]
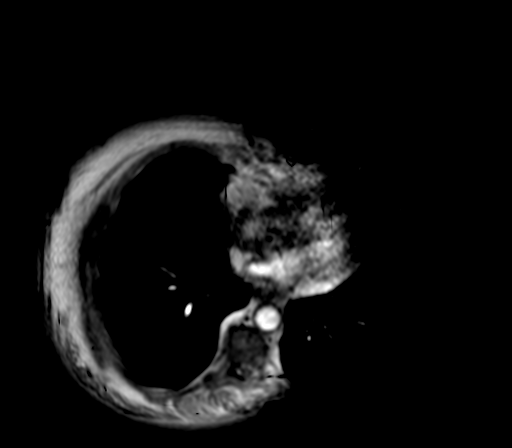

[Series 6: axial tru fisp · axial · 5.0mm · 1.56mm/px · 1 of 42 slices shown]
[im 1/42]
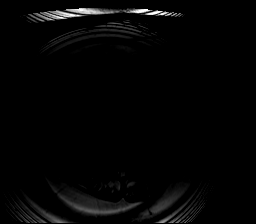

[Series 7: T2 · axial · 5.0mm · 0.78mm/px · 1 of 45 slices shown (3 of 3)]
[im 1/45]
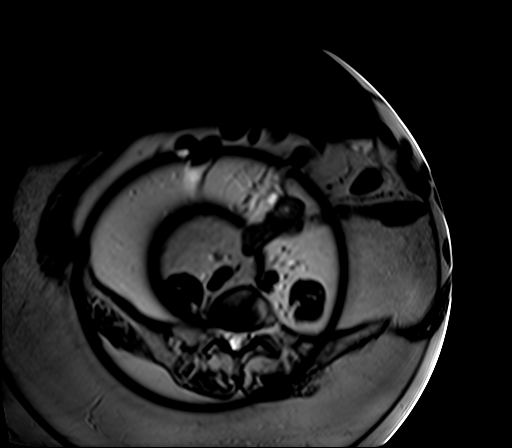

[Series 8: ep2d_[id number]_500_800_p2_trig · axial · 5.0mm · 2.08mm/px · z∈[-102,+179]mm · 3 of 138 slices shown]
[im 1/138]
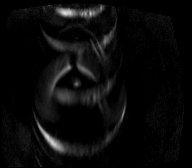
[im 69/138]
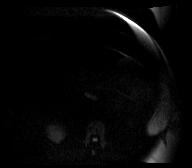
[im 138/138]
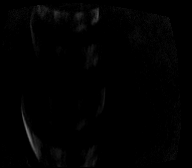

[Series 9: ep2d_[id number]_p2_trig_adc · axial · 5.0mm · 2.08mm/px · 1 of 46 slices shown]
[im 1/46]
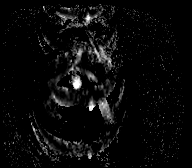

[Series 10: T1 dynamic · axial · non-contrast · 2.0mm · 0.78mm/px · z∈[-95,+127]mm · 3 of 112 slices shown]
[im 1/112]
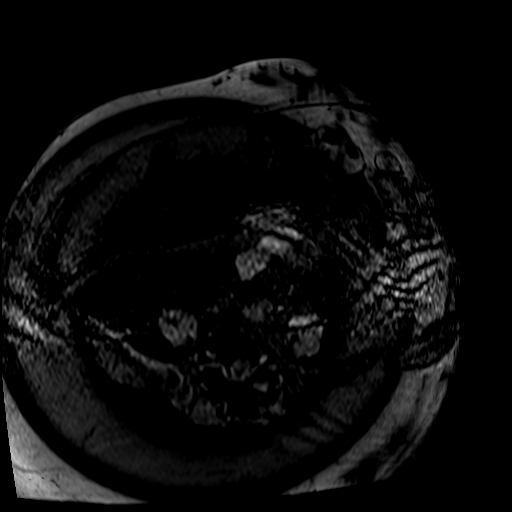
[im 56/112]
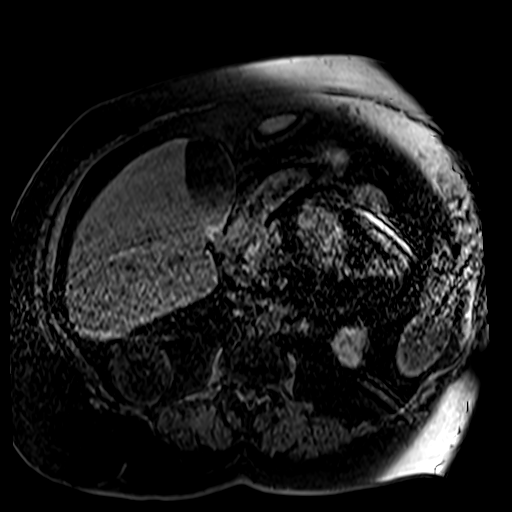
[im 112/112]
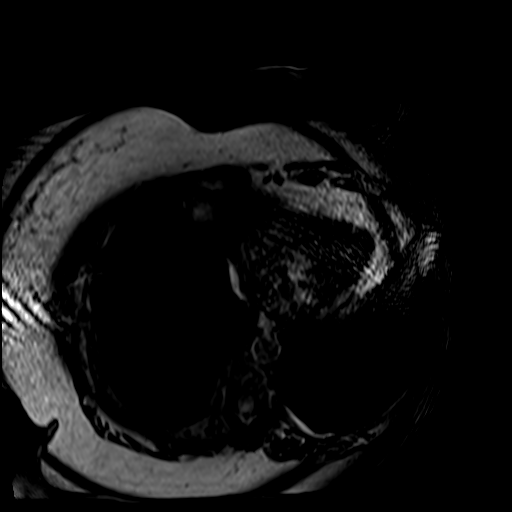

[Series 11: post 25 sec · axial · 2.0mm · 0.78mm/px · z∈[-95,+127]mm · 4 of 112 slices shown]
[im 1/112]
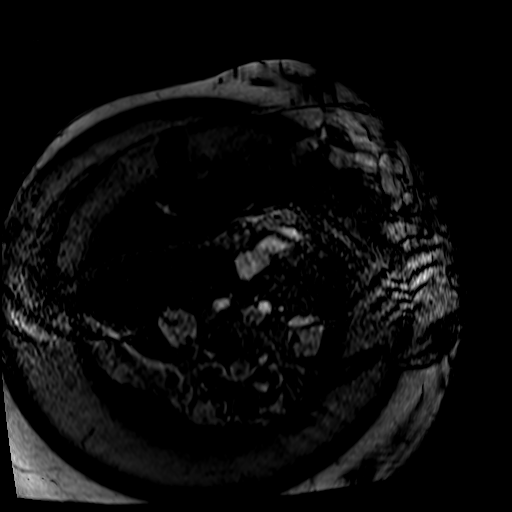
[im 38/112]
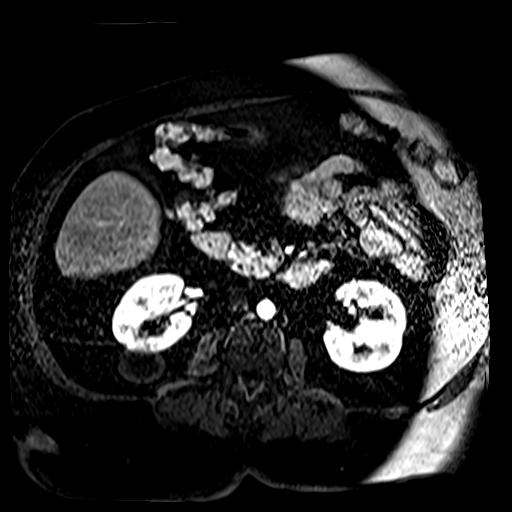
[im 75/112]
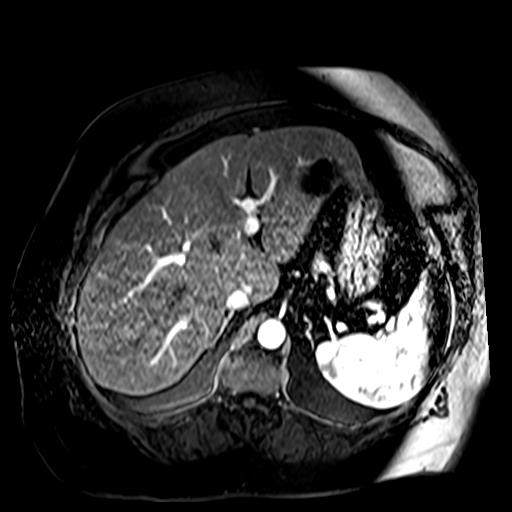
[im 112/112]
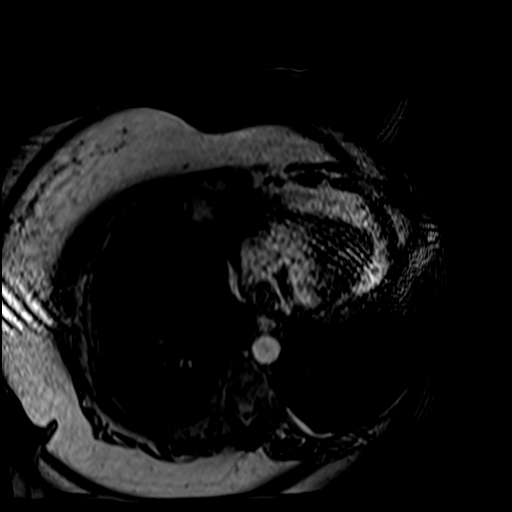

[Series 12: post 25 sec_sub · axial · 2.0mm · 0.78mm/px · z∈[-95,+127]mm · 4 of 112 slices shown]
[im 1/112]
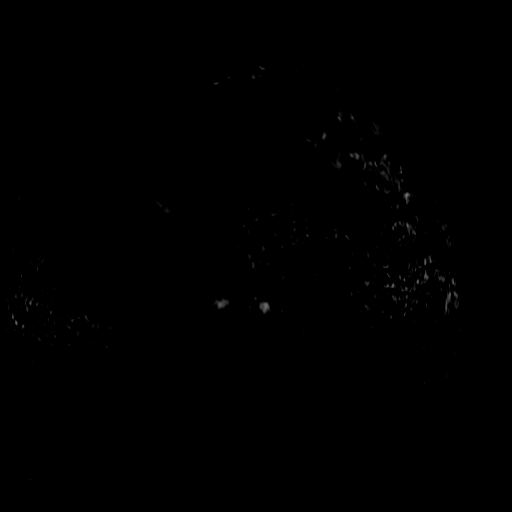
[im 38/112]
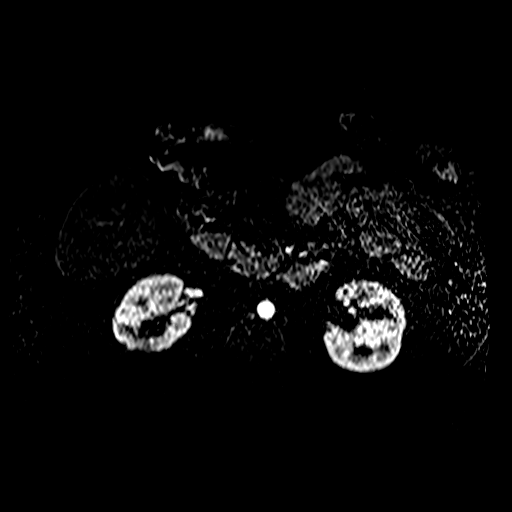
[im 75/112]
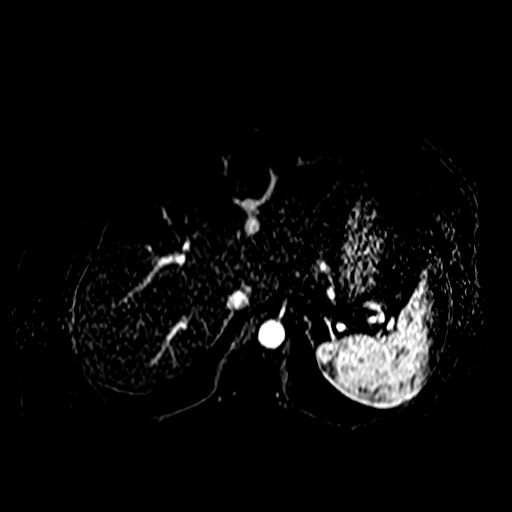
[im 112/112]
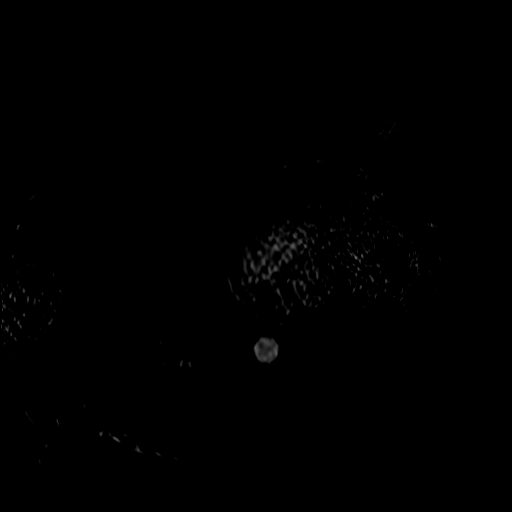

[Series 13: post 45 sec · axial · 2.0mm · 0.78mm/px · z∈[-95,+53]mm · 3 of 112 slices shown]
[im 1/112]
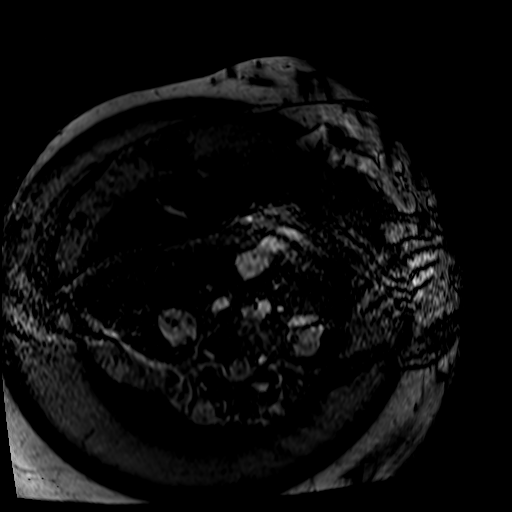
[im 38/112]
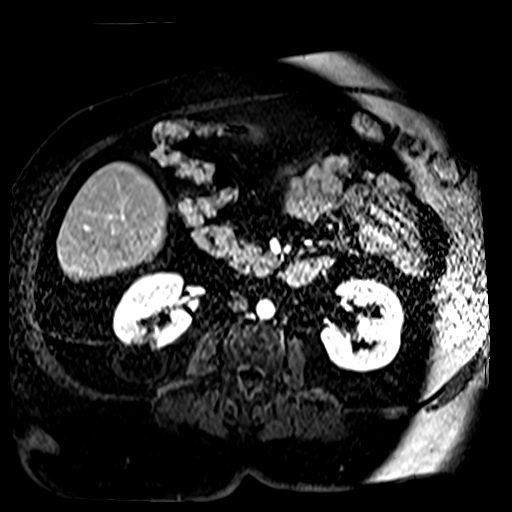
[im 75/112]
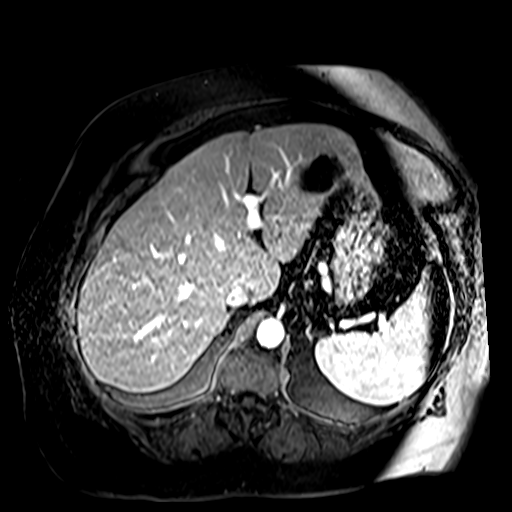

[24 of 48 positions shown; findings below may reference images not displayed]

FINDINGS: Lower chest: Unremarkable

Hepatobiliary: Stable scattered hepatic cysts. The dominant cyst is
in the lateral segment left hepatic lobe measures 5.2 by 4.2 cm on
image [DATE]. Gallbladder unremarkable. No biliary dilatation.

There is considerable diffuse hepatic steatosis.

Pancreas:  Unremarkable

Spleen: The lateral portion of spleen is partially excluded on many
sequences due to body habitus. Otherwise unremarkable.

Adrenals/Urinary Tract: Both adrenal glands appear normal. Bilateral
renal cysts are noted. These include a 1.1 by 0.8 cm Bosniak
category 2 cyst of the right mid kidney on image 78/10.

Anteriorly in the upper pole of the right kidney, a 1.9 by 1.5 by
1.3 cm (volume = 1.9 cm^3) lesion with primarily accentuated T2
and low T1 signal demonstrates definite enhancement especially along
its margins compatible with a small mass lesion. Back on 04/11/2015
this lesion measured 1.3 by 1.1 by 1.1 cm (volume = 0.8 cm^3).

Stomach/Bowel: Unremarkable where included.

Vascular/Lymphatic: Unremarkable. No findings of tumor thrombus in
the renal veins. No adenopathy.

Other:  No supplemental non-categorized findings.

Musculoskeletal: Suspected hemangioma in the L5 vertebral body.
IMPRESSION: 1. Interval moderate increase in size of the peripherally enhancing
right kidney upper pole lesion, currently 1.9 cm in long axis. Today
the lesion demonstrates less central enhancement and more peripheral
enhancement, whereas previously the lesion was centrally enhancing.
This is likely a small renal cell carcinoma. Given the small size,
potential management may include surveillance, ablation, or
resection.
2. Scattered benign hepatic and renal cysts.
3. Diffuse hepatic steatosis.

## 2020-12-21 MED ORDER — IOHEXOL 300 MG/ML  SOLN
100.0000 mL | Freq: Once | INTRAMUSCULAR | Status: AC | PRN
  Administered 2020-12-21: 100 mL via INTRAVENOUS

## 2020-12-26 ENCOUNTER — Encounter: Payer: Self-pay | Admitting: *Deleted

## 2020-12-26 ENCOUNTER — Ambulatory Visit
Admission: RE | Admit: 2020-12-26 | Discharge: 2020-12-26 | Disposition: A | Discharge status: Home / Self Care | Payer: Medicare Other | Source: Ambulatory Visit | Attending: Interventional Radiology | Admitting: Interventional Radiology

## 2020-12-26 ENCOUNTER — Other Ambulatory Visit: Payer: Self-pay

## 2020-12-26 DIAGNOSIS — N2889 Other specified disorders of kidney and ureter: Secondary | ICD-10-CM

## 2020-12-26 DIAGNOSIS — Z9889 Other specified postprocedural states: Secondary | ICD-10-CM | POA: Diagnosis not present

## 2020-12-26 HISTORY — PX: IR RADIOLOGIST EVAL & MGMT: IMG5224

## 2020-12-26 NOTE — Progress Notes (Signed)
Patient ID: Kristine Zamora, female   DOB: 23-Mar-1946, 75 y.o.   MRN: [id number]       Chief Complaint:  Small right renal mass status post ablation  Referring Physician(s): Dr. Alyson Ingles  History of Present Illness: Kristine Zamora is a 75 y.o. female who is now almost 2 years status post image guided cryoablation of a small right renal mass.  Lesion was compatible with a renal neoplasm by imaging criteria.  Cryoablation performed 02/16/2019 at [facility].  She has had interval surveillance imaging.  Imaging confirms stable ablation defect with further retraction and scarring.  No signs of residual or recurrent disease.  No suspicious enhancement.  No other new renal lesion or acute process.  She continues to be asymptomatic.  No flank or abdominal pain.  No recent illness, fever, dysuria or hematuria.  Past Medical History:  Diagnosis Date   Anemia    Arthritis    HANDS AND FEET   Asthma    Back pain    Diabetes mellitus without complication ([place])    TYPE 2   Dyspnea    with exersion    Hypertension    Lichen planus atrophicus    PONV (postoperative nausea and vomiting)    Right renal mass     Past Surgical History:  Procedure Laterality Date   BREAST SURGERY  1966   MILK TUBE REMOVED   COLONOSCOPY  2008   Dr. Gala Romney: pancolonic diverticula, hyperplastic rectosigmoid polyp   COLONOSCOPY N/A 01/16/2015   Procedure: COLONOSCOPY;  Surgeon: Daneil Dolin, MD;  [facility];  Service: Endoscopy;  Laterality: N/A;  0900   IR RADIOLOGIST EVAL & MGMT  01/19/2019   IR RADIOLOGIST EVAL & MGMT  03/16/2019   IR RADIOLOGIST EVAL & MGMT  06/15/2019   IR RADIOLOGIST EVAL & MGMT  12/22/2019   RADIOLOGY WITH ANESTHESIA Right 02/16/2019   Procedure: CT CRYOABLATION RIGHT RENAL MASS;  Surgeon: Greggory Keen, MD;  Location: [facility];  Service: Anesthesiology;  Laterality: Right;   TONSILLECTOMY  1965    Allergies: Peach [prunus persica]  Medications: Prior to Admission  medications   Medication Sig Start Date End Date Taking? Authorizing Provider  albuterol (VENTOLIN HFA) 108 (90 Base) MCG/ACT inhaler Inhale 1-2 puffs into the lungs every 6 (six) hours as needed for wheezing or shortness of breath.    [provider]  aspirin 81 MG tablet Take 81 mg by mouth daily.    [provider]  famotidine (PEPCID) 20 MG tablet Take 1 tablet (20 mg total) by mouth 2 (two) times daily. Patient not taking: Reported on 02/04/2019 01/19/19   Mesner, Corene Cornea, MD  lisinopril (PRINIVIL,ZESTRIL) 10 MG tablet Take 10 mg by mouth daily.    [provider]  metFORMIN (GLUCOPHAGE) 1000 MG tablet Take 1,000 mg by mouth 2 (two) times daily with a meal. 10/11/18   [provider]  pantoprazole (PROTONIX) 20 MG tablet Take 1 tablet (20 mg total) by mouth daily. Patient not taking: Reported on 02/04/2019 01/19/19   Mesner, Corene Cornea, MD  pravastatin (PRAVACHOL) 20 MG tablet Take 20 mg by mouth at bedtime.     [provider]     Family History  Problem Relation Age of Onset   Alcohol abuse Mother        old age   Colon cancer Father        diagnosed in his early 57s   Breast cancer Daughter     Social  History   Socioeconomic History   Marital status: Married    Spouse name: Not on file   Number of children: Not on file   Years of education: Not on file   Highest education level: Not on file  Occupational History   Not on file  Tobacco Use   Smoking status: Former    Types: Cigarettes    Quit date: 12/21/1994    Years since quitting: 26.0   Smokeless tobacco: Never  Vaping Use   Vaping Use: Never used  Substance and Sexual Activity   Alcohol use: No    Alcohol/week: 0.0 standard drinks    Comment: quit drinking about 1996   Drug use: No   Sexual activity: Never  Other Topics Concern   Not on file  Social History Narrative   Not on file   Social Determinants of Health   Financial Resource Strain: Not on file  Food Insecurity:  Not on file  Transportation Needs: Not on file  Physical Activity: Not on file  Stress: Not on file  Social Connections: Not on file     Review of Systems  Review of Systems: A 12 point ROS discussed and pertinent positives are indicated in the HPI above.  All other systems are negative.  Physical Exam No direct physical exam was performed, telephone health visit only today Vital Signs: There were no vitals taken for this visit.  Imaging: CT ABDOMEN W WO CONTRAST  Result Date: 12/24/2020 CLINICAL DATA:  Follow-up right renal mass, prior cryo ablation on 02/16/2019 EXAM: CT ABDOMEN WITHOUT AND WITH CONTRAST TECHNIQUE: Multidetector CT imaging of the abdomen was performed following the standard protocol before and following the bolus administration of intravenous contrast. CONTRAST:  155m OMNIPAQUE IOHEXOL 300 MG/ML  SOLN COMPARISON:  Multiple priors including most recent CT December 21, 2019. FINDINGS: Lower chest: No acute abnormality. Normal size heart. No significant pericardial effusion/thickening. Mitral annulus calcifications. Hepatobiliary: Hepatic steatosis. Hypodense hepatic cysts in the left lobe of the liver measuring up to 5 cm. No suspicious hepatic lesions. Gallbladder is unremarkable. No biliary ductal dilation. Pancreas: Within normal limits. Spleen: Within normal limits. Adrenals/Urinary Tract: Bilateral adrenal glands are unremarkable. No hydronephrosis.  No renal calculus visualized. Postprocedural changes of prior right upper pole cryoablation with some focal fatty stranding and architectural distortion overall similar to prior examinations. No enhancing soft tissue nodularity in the treatment bed. Exophytic hypodense right upper pole renal cyst. Subcentimeter hypodense renal lesions which are technically too small to accurately characterize but statistically likely represent cysts. No solid enhancing renal masses. Stomach/Bowel: Stomach is within normal limits. No pathologic  dilation of small bowel. Scattered colonic diverticulosis without findings of acute diverticulitis. No evidence of bowel wall thickening, distention, or inflammatory changes. Vascular/Lymphatic: Aortic atherosclerosis without aneurysmal dilation. No pathologically enlarged abdominal lymph nodes. Other: No abdominal ascites. Musculoskeletal: No aggressive lytic or blastic lesion of bone. Thoracolumbar vertebral body hemangiomas. IMPRESSION: 1. Postprocedural changes of prior right upper pole cryoablation without evidence of recurrent or metastatic disease within the abdomen. 2. Hepatic steatosis. 3. Colonic diverticulosis without findings of acute diverticulitis. 4.  Aortic Atherosclerosis (ICD10-I70.0). Electronically Signed   By: JDahlia BailiffMD   On: 12/24/2020 09:08    Labs:  CBC: No results for input(s): WBC, HGB, HCT, PLT in the last 8760 hours.  COAGS: No results for input(s): INR, APTT in the last 8760 hours.  BMP: Recent Labs    12/21/20 1413  CREATININE 0.50    LIVER FUNCTION TESTS:  No results for input(s): BILITOT, AST, ALT, ALKPHOS, PROT, ALBUMIN in the last 8760 hours.   Assessment and Plan:  Almost 2 years status post small right renal neoplasm image guided cryoablation.  Surveillance imaging confirms stable ablation defect without any evidence of residual recurrent disease.  No new renal abnormality.  Plan: Continue annual CT surveillance (July 2023) at [facility] for total of 5 years.   Electronically Signed: Greggory Keen 12/26/2020, 2:58 PM   I spent a total of    25 Minutes in remote  clinical consultation, greater than 50% of which was counseling/coordinating care for this patient status post right renal cryoablation.    Visit type: Audio only (telephone). Audio (no video) only due to patient's lack of internet/smartphone capability.\ Alternative for in-person consultation at [facility], [place] [address] [place], [place], [place]. This visit type was  conducted due to national recommendations for restrictions regarding the COVID-19 Pandemic (e.g. social distancing).  This format is felt to be most appropriate for this patient at this time.  All issues noted in this document were discussed and addressed.

## 2021-01-11 DIAGNOSIS — D519 Vitamin B12 deficiency anemia, unspecified: Secondary | ICD-10-CM | POA: Diagnosis not present

## 2021-02-25 DIAGNOSIS — E538 Deficiency of other specified B group vitamins: Secondary | ICD-10-CM | POA: Diagnosis not present

## 2021-05-16 DIAGNOSIS — E119 Type 2 diabetes mellitus without complications: Secondary | ICD-10-CM | POA: Diagnosis not present

## 2021-05-16 DIAGNOSIS — E538 Deficiency of other specified B group vitamins: Secondary | ICD-10-CM | POA: Diagnosis not present

## 2021-05-16 DIAGNOSIS — R7309 Other abnormal glucose: Secondary | ICD-10-CM | POA: Diagnosis not present

## 2021-05-16 DIAGNOSIS — K5792 Diverticulitis of intestine, part unspecified, without perforation or abscess without bleeding: Secondary | ICD-10-CM | POA: Diagnosis not present

## 2021-05-16 DIAGNOSIS — I10 Essential (primary) hypertension: Secondary | ICD-10-CM | POA: Diagnosis not present

## 2021-05-20 DIAGNOSIS — E538 Deficiency of other specified B group vitamins: Secondary | ICD-10-CM | POA: Diagnosis not present

## 2021-05-20 DIAGNOSIS — K5792 Diverticulitis of intestine, part unspecified, without perforation or abscess without bleeding: Secondary | ICD-10-CM | POA: Diagnosis not present

## 2021-05-20 DIAGNOSIS — M1991 Primary osteoarthritis, unspecified site: Secondary | ICD-10-CM | POA: Diagnosis not present

## 2021-05-20 DIAGNOSIS — I10 Essential (primary) hypertension: Secondary | ICD-10-CM | POA: Diagnosis not present

## 2021-07-01 ENCOUNTER — Other Ambulatory Visit: Payer: Self-pay

## 2021-07-01 ENCOUNTER — Encounter: Payer: Self-pay | Admitting: Emergency Medicine

## 2021-07-01 ENCOUNTER — Ambulatory Visit (INDEPENDENT_AMBULATORY_CARE_PROVIDER_SITE_OTHER): Payer: Medicare Other

## 2021-07-01 ENCOUNTER — Ambulatory Visit
Admission: EM | Admit: 2021-07-01 | Discharge: 2021-07-01 | Disposition: A | Discharge status: Home / Self Care | Payer: Medicare Other | Attending: Urgent Care | Admitting: Urgent Care

## 2021-07-01 DIAGNOSIS — M5431 Sciatica, right side: Secondary | ICD-10-CM | POA: Diagnosis not present

## 2021-07-01 DIAGNOSIS — M79641 Pain in right hand: Secondary | ICD-10-CM | POA: Diagnosis not present

## 2021-07-01 DIAGNOSIS — M25561 Pain in right knee: Secondary | ICD-10-CM

## 2021-07-01 DIAGNOSIS — E119 Type 2 diabetes mellitus without complications: Secondary | ICD-10-CM

## 2021-07-01 MED ORDER — TIZANIDINE HCL 4 MG PO TABS: 4.0000 mg | ORAL_TABLET | Freq: Every day | ORAL | 0 refills | Status: DC

## 2021-07-01 MED ORDER — PREDNISONE 20 MG PO TABS: ORAL_TABLET | ORAL | 0 refills | Status: DC

## 2021-07-01 NOTE — ED Provider Notes (Signed)
[place]   MRN: [id number] DOB: 06-Apr-1946  Subjective:   Kristine Zamora is a 76 y.o. female presenting for 4-day history of acute onset persistent right knee pain with difficulty bearing weight.  Subsequently in the past 2 days she started to have pain over the right posterior buttock radiating into the thigh connecting with the knee.  No fall, trauma, weakness, numbness or tingling.  No rashes.  She is a diabetic, is treated without insulin. Last a1c was 7.0%.  She does have a history of back pain but is not bothering her at this time.  No history of arthritis that she knows of.  No current facility-administered medications for this encounter.  Current Outpatient Medications:    albuterol (VENTOLIN HFA) 108 (90 Base) MCG/ACT inhaler, Inhale 1-2 puffs into the lungs every 6 (six) hours as needed for wheezing or shortness of breath., Disp: , Rfl:    aspirin 81 MG tablet, Take 81 mg by mouth daily., Disp: , Rfl:    famotidine (PEPCID) 20 MG tablet, Take 1 tablet (20 mg total) by mouth 2 (two) times daily., Disp: 20 tablet, Rfl: 0   lisinopril (PRINIVIL,ZESTRIL) 10 MG tablet, Take 10 mg by mouth daily., Disp: , Rfl:    metFORMIN (GLUCOPHAGE) 1000 MG tablet, Take 1,000 mg by mouth 2 (two) times daily with a meal., Disp: , Rfl:    pravastatin (PRAVACHOL) 20 MG tablet, Take 20 mg by mouth at bedtime. , Disp: , Rfl:    pantoprazole (PROTONIX) 20 MG tablet, Take 1 tablet (20 mg total) by mouth daily. 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 She thinks it was something they sprayed them with.    Past Medical History:  Diagnosis Date   Anemia    Arthritis    HANDS AND FEET   Asthma    Back pain    Diabetes mellitus without complication ([place])    TYPE 2   Dyspnea    with exersion    Hypertension    Lichen planus atrophicus    PONV  (postoperative nausea and vomiting)    Right renal mass      Past Surgical History:  Procedure Laterality Date   BREAST SURGERY  1966   MILK TUBE REMOVED   COLONOSCOPY  2008   Dr. Gala Romney: pancolonic diverticula, hyperplastic rectosigmoid polyp   COLONOSCOPY N/A 01/16/2015   Procedure: COLONOSCOPY;  Surgeon: Daneil Dolin, MD;  [facility];  Service: Endoscopy;  Laterality: N/A;  0900   IR RADIOLOGIST EVAL & MGMT  01/19/2019   IR RADIOLOGIST EVAL & MGMT  03/16/2019   IR RADIOLOGIST EVAL & MGMT  06/15/2019   IR RADIOLOGIST EVAL & MGMT  12/22/2019   IR RADIOLOGIST EVAL & MGMT  12/26/2020   RADIOLOGY WITH ANESTHESIA Right 02/16/2019   Procedure: CT CRYOABLATION RIGHT RENAL MASS;  Surgeon: Greggory Keen, MD;  [facility];  Service: Anesthesiology;  Laterality: Right;   TONSILLECTOMY  1965    Family History  Problem Relation Age of Onset   Alcohol abuse Mother        old age   Colon cancer Father        diagnosed in his early 61s   Breast cancer Daughter     Social History   Tobacco Use   Smoking status: Former    Types: Cigarettes  Quit date: 12/21/1994    Years since quitting: 26.5   Smokeless tobacco: Never  Vaping Use   Vaping Use: Never used  Substance Use Topics   Alcohol use: No    Alcohol/week: 0.0 standard drinks    Comment: quit drinking about 1996   Drug use: No    ROS   Objective:   Vitals: BP (!) 143/72 (BP Location: Right Arm)    Pulse 81    Temp (!) 97.5 F (36.4 C) (Oral)    Resp 18    Ht 5\' 2"  (1.575 m)    Wt 212 lb (96.2 kg)    SpO2 96%    BMI 38.78 kg/m   Physical Exam Constitutional:      General: She is not in acute distress.    Appearance: Normal appearance. She is well-developed. She is obese. She is not ill-appearing, toxic-appearing or diaphoretic.  HENT:     Head: Normocephalic and atraumatic.     Nose: Nose normal.     Mouth/Throat:     Mouth: Mucous membranes are moist.  Eyes:     General: No scleral icterus.        Right eye: No discharge.        Left eye: No discharge.     Extraocular Movements: Extraocular movements intact.  Cardiovascular:     Rate and Rhythm: Normal rate.  Pulmonary:     Effort: Pulmonary effort is normal.  Musculoskeletal:     Lumbar back: No swelling, edema, deformity, signs of trauma, lacerations, spasms, tenderness or bony tenderness. Normal range of motion. Positive right straight leg raise test. Negative left straight leg raise test. No scoliosis.     Right hip: Tenderness (Posterior lateral hip, mid buttock) present. No deformity, lacerations, bony tenderness or crepitus. Normal range of motion.     Right upper leg: No swelling, edema, deformity, lacerations, tenderness or bony tenderness.     Right knee: No swelling, deformity, effusion, erythema, ecchymosis or lacerations. Decreased range of motion. Tenderness present over the medial joint line. No lateral joint line or patellar tendon tenderness.  Skin:    General: Skin is warm and dry.  Neurological:     General: No focal deficit present.     Mental Status: She is alert and oriented to person, place, and time.     Motor: No weakness.     Coordination: Coordination normal.     Gait: Gait normal.  Psychiatric:        Mood and Affect: Mood normal.        Behavior: Behavior normal.        Thought Content: Thought content normal.        Judgment: Judgment normal.    DG Knee Complete 4 Views Right  Result Date: 07/01/2021 CLINICAL DATA:  Right knee pain over the last 4 days. No known injury. EXAM: RIGHT KNEE - COMPLETE 4+ VIEW COMPARISON:  None. FINDINGS: No visible joint effusion. No weight-bearing compartment joint space narrowing. No patellofemoral osteophytes. No focal bone lesion or fracture IMPRESSION: Negative radiographs. Electronically Signed   By: Nelson Chimes M.D.   On: 07/01/2021 13:15    Assessment and Plan :   PDMP not reviewed this encounter.  1. Sciatica of right side   2. Acute pain of right knee    3. Type 2 diabetes mellitus treated without insulin ([facility])    Recommended an oral prednisone course for her severe sciatica type pain.  Follow-up with an orthopedist as soon as  possible. Counseled patient on potential for adverse effects with medications prescribed/recommended today, ER and return-to-clinic precautions discussed, patient verbalized understanding.    Jaynee Eagles, PA-C 07/01/21 1450

## 2021-07-01 NOTE — ED Triage Notes (Addendum)
Pt reports right knee pain that is now radiating to right hip since this weekend. Pt denies any known injury. Pt able to bear weight but reports increased pain and having to use walker at home.

## 2021-07-24 DIAGNOSIS — M1991 Primary osteoarthritis, unspecified site: Secondary | ICD-10-CM | POA: Diagnosis not present

## 2021-07-24 DIAGNOSIS — E538 Deficiency of other specified B group vitamins: Secondary | ICD-10-CM | POA: Diagnosis not present

## 2021-07-24 DIAGNOSIS — M719 Bursopathy, unspecified: Secondary | ICD-10-CM | POA: Diagnosis not present

## 2021-07-24 DIAGNOSIS — I10 Essential (primary) hypertension: Secondary | ICD-10-CM | POA: Diagnosis not present

## 2021-07-26 ENCOUNTER — Other Ambulatory Visit (HOSPITAL_COMMUNITY): Payer: Self-pay | Admitting: Internal Medicine

## 2021-07-26 DIAGNOSIS — Z1231 Encounter for screening mammogram for malignant neoplasm of breast: Secondary | ICD-10-CM

## 2021-08-14 DIAGNOSIS — M25561 Pain in right knee: Secondary | ICD-10-CM | POA: Diagnosis not present

## 2021-08-26 ENCOUNTER — Ambulatory Visit (HOSPITAL_COMMUNITY)
Admission: RE | Admit: 2021-08-26 | Discharge: 2021-08-26 | Disposition: A | Discharge status: Home / Self Care | Payer: Medicare Other | Source: Ambulatory Visit | Attending: Internal Medicine | Admitting: Internal Medicine

## 2021-08-26 ENCOUNTER — Other Ambulatory Visit: Payer: Self-pay

## 2021-08-26 DIAGNOSIS — Z1231 Encounter for screening mammogram for malignant neoplasm of breast: Secondary | ICD-10-CM | POA: Diagnosis not present

## 2021-08-27 DIAGNOSIS — M25551 Pain in right hip: Secondary | ICD-10-CM | POA: Diagnosis not present

## 2021-08-27 DIAGNOSIS — M25561 Pain in right knee: Secondary | ICD-10-CM | POA: Diagnosis not present

## 2021-08-27 DIAGNOSIS — M545 Low back pain, unspecified: Secondary | ICD-10-CM | POA: Diagnosis not present

## 2021-08-28 ENCOUNTER — Other Ambulatory Visit: Payer: Self-pay | Admitting: Orthopedic Surgery

## 2021-08-28 ENCOUNTER — Other Ambulatory Visit (HOSPITAL_COMMUNITY): Payer: Self-pay | Admitting: Orthopedic Surgery

## 2021-08-28 DIAGNOSIS — M25561 Pain in right knee: Secondary | ICD-10-CM

## 2021-09-16 ENCOUNTER — Ambulatory Visit (HOSPITAL_COMMUNITY)
Admission: RE | Admit: 2021-09-16 | Discharge: 2021-09-16 | Disposition: A | Discharge status: Home / Self Care | Payer: Medicare Other | Source: Ambulatory Visit | Attending: Orthopedic Surgery | Admitting: Orthopedic Surgery

## 2021-09-16 DIAGNOSIS — M25561 Pain in right knee: Secondary | ICD-10-CM | POA: Insufficient documentation

## 2021-10-10 DIAGNOSIS — M1711 Unilateral primary osteoarthritis, right knee: Secondary | ICD-10-CM | POA: Diagnosis not present

## 2021-11-11 DIAGNOSIS — I700 Atherosclerosis of aorta: Secondary | ICD-10-CM | POA: Diagnosis not present

## 2021-11-11 DIAGNOSIS — J329 Chronic sinusitis, unspecified: Secondary | ICD-10-CM | POA: Diagnosis not present

## 2021-11-11 DIAGNOSIS — I10 Essential (primary) hypertension: Secondary | ICD-10-CM | POA: Diagnosis not present

## 2021-11-11 DIAGNOSIS — E538 Deficiency of other specified B group vitamins: Secondary | ICD-10-CM | POA: Diagnosis not present

## 2021-11-11 DIAGNOSIS — E119 Type 2 diabetes mellitus without complications: Secondary | ICD-10-CM | POA: Diagnosis not present

## 2021-11-14 ENCOUNTER — Other Ambulatory Visit: Payer: Self-pay | Admitting: Interventional Radiology

## 2021-11-14 DIAGNOSIS — N2889 Other specified disorders of kidney and ureter: Secondary | ICD-10-CM

## 2021-11-25 ENCOUNTER — Ambulatory Visit (HOSPITAL_COMMUNITY)
Admission: RE | Admit: 2021-11-25 | Discharge: 2021-11-25 | Disposition: A | Discharge status: Home / Self Care | Payer: Medicare Other | Source: Ambulatory Visit | Attending: Internal Medicine | Admitting: Internal Medicine

## 2021-11-25 ENCOUNTER — Other Ambulatory Visit (HOSPITAL_COMMUNITY): Payer: Self-pay | Admitting: Internal Medicine

## 2021-11-25 DIAGNOSIS — J329 Chronic sinusitis, unspecified: Secondary | ICD-10-CM | POA: Diagnosis not present

## 2021-11-25 DIAGNOSIS — R059 Cough, unspecified: Secondary | ICD-10-CM

## 2021-11-25 DIAGNOSIS — E538 Deficiency of other specified B group vitamins: Secondary | ICD-10-CM | POA: Diagnosis not present

## 2021-11-25 DIAGNOSIS — E119 Type 2 diabetes mellitus without complications: Secondary | ICD-10-CM | POA: Diagnosis not present

## 2021-11-25 DIAGNOSIS — J9801 Acute bronchospasm: Secondary | ICD-10-CM | POA: Diagnosis not present

## 2021-11-25 DIAGNOSIS — I700 Atherosclerosis of aorta: Secondary | ICD-10-CM | POA: Diagnosis not present

## 2021-11-25 DIAGNOSIS — I10 Essential (primary) hypertension: Secondary | ICD-10-CM | POA: Diagnosis not present

## 2021-11-25 DIAGNOSIS — J40 Bronchitis, not specified as acute or chronic: Secondary | ICD-10-CM | POA: Diagnosis not present

## 2021-12-10 DIAGNOSIS — H2513 Age-related nuclear cataract, bilateral: Secondary | ICD-10-CM | POA: Diagnosis not present

## 2021-12-10 DIAGNOSIS — Z7984 Long term (current) use of oral hypoglycemic drugs: Secondary | ICD-10-CM | POA: Diagnosis not present

## 2021-12-10 DIAGNOSIS — E1136 Type 2 diabetes mellitus with diabetic cataract: Secondary | ICD-10-CM | POA: Diagnosis not present

## 2021-12-12 ENCOUNTER — Other Ambulatory Visit (HOSPITAL_BASED_OUTPATIENT_CLINIC_OR_DEPARTMENT_OTHER): Payer: Medicare Other

## 2021-12-13 ENCOUNTER — Other Ambulatory Visit: Payer: Medicare Other

## 2021-12-24 ENCOUNTER — Encounter (HOSPITAL_COMMUNITY): Payer: Self-pay

## 2021-12-24 ENCOUNTER — Ambulatory Visit (HOSPITAL_COMMUNITY)
Admission: RE | Admit: 2021-12-24 | Discharge: 2021-12-24 | Disposition: A | Discharge status: Home / Self Care | Payer: Medicare Other | Source: Ambulatory Visit | Attending: Interventional Radiology | Admitting: Interventional Radiology

## 2021-12-24 DIAGNOSIS — I700 Atherosclerosis of aorta: Secondary | ICD-10-CM | POA: Diagnosis not present

## 2021-12-24 DIAGNOSIS — N281 Cyst of kidney, acquired: Secondary | ICD-10-CM | POA: Diagnosis not present

## 2021-12-24 DIAGNOSIS — J329 Chronic sinusitis, unspecified: Secondary | ICD-10-CM | POA: Diagnosis not present

## 2021-12-24 DIAGNOSIS — N2889 Other specified disorders of kidney and ureter: Secondary | ICD-10-CM | POA: Diagnosis not present

## 2021-12-24 DIAGNOSIS — K2100 Gastro-esophageal reflux disease with esophagitis, without bleeding: Secondary | ICD-10-CM | POA: Diagnosis not present

## 2021-12-24 DIAGNOSIS — I10 Essential (primary) hypertension: Secondary | ICD-10-CM | POA: Diagnosis not present

## 2021-12-24 DIAGNOSIS — K760 Fatty (change of) liver, not elsewhere classified: Secondary | ICD-10-CM | POA: Diagnosis not present

## 2021-12-24 DIAGNOSIS — K5790 Diverticulosis of intestine, part unspecified, without perforation or abscess without bleeding: Secondary | ICD-10-CM | POA: Diagnosis not present

## 2021-12-24 DIAGNOSIS — J40 Bronchitis, not specified as acute or chronic: Secondary | ICD-10-CM | POA: Diagnosis not present

## 2021-12-24 MED ORDER — IOHEXOL 300 MG/ML  SOLN
100.0000 mL | Freq: Once | INTRAMUSCULAR | Status: AC | PRN
  Administered 2021-12-24: 100 mL via INTRAVENOUS

## 2021-12-25 LAB — POCT I-STAT CREATININE: Creatinine, Ser: 0.6 mg/dL (ref 0.44–1.00)

## 2021-12-26 ENCOUNTER — Encounter: Payer: Self-pay | Admitting: *Deleted

## 2021-12-26 ENCOUNTER — Ambulatory Visit
Admission: RE | Admit: 2021-12-26 | Discharge: 2021-12-26 | Disposition: A | Discharge status: Home / Self Care | Payer: Medicare Other | Source: Ambulatory Visit | Attending: Interventional Radiology | Admitting: Interventional Radiology

## 2021-12-26 DIAGNOSIS — Z9889 Other specified postprocedural states: Secondary | ICD-10-CM | POA: Diagnosis not present

## 2021-12-26 DIAGNOSIS — N2889 Other specified disorders of kidney and ureter: Secondary | ICD-10-CM

## 2021-12-26 HISTORY — PX: IR RADIOLOGIST EVAL & MGMT: IMG5224

## 2021-12-26 NOTE — Progress Notes (Signed)
Patient ID: Kristine Zamora, female   DOB: [birth year], 76 y.o.   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 female who is now nearly 3 years status post CT-guided cryoablation of a small right renal mass.  Again, lesion was compatible with a small renal neoplasm by imaging criteria and surveillance imaging did show slight enlargement.  Successful CT-guided cryoablation performed 02/16/2019 at [facility].  Surveillance imaging continues to confirm a stable ablation defect with scarring and retraction.  No signs of residual or recurrent disease.  No suspicious enhancement.  No new renal abnormality or acute process.  She continues to remain asymptomatic from a urinary tract standpoint.  No flank or abdominal pain.  No recent illness, fever, dysuria or hematuria.  Past Medical History:  Diagnosis Date   Anemia    Arthritis    HANDS AND FEET   Asthma    Back pain    Diabetes mellitus without complication ([place])    TYPE 2   Dyspnea    with exersion    Hypertension    Lichen planus atrophicus    PONV (postoperative nausea and vomiting)    Right renal mass     Past Surgical History:  Procedure Laterality Date   BREAST SURGERY  1966   MILK TUBE REMOVED   COLONOSCOPY  2008   Dr. Gala Romney: pancolonic diverticula, hyperplastic rectosigmoid polyp   COLONOSCOPY N/A 01/16/2015   Procedure: COLONOSCOPY;  Surgeon: Daneil Dolin, MD;  [facility];  Service: Endoscopy;  Laterality: N/A;  0900   IR RADIOLOGIST EVAL & MGMT  01/19/2019   IR RADIOLOGIST EVAL & MGMT  03/16/2019   IR RADIOLOGIST EVAL & MGMT  06/15/2019   IR RADIOLOGIST EVAL & MGMT  12/22/2019   IR RADIOLOGIST EVAL & MGMT  12/26/2020   RADIOLOGY WITH ANESTHESIA Right 02/16/2019   Procedure: CT CRYOABLATION RIGHT RENAL MASS;  Surgeon: Greggory Keen, MD;  [facility];  Service:  Anesthesiology;  Laterality: Right;   TONSILLECTOMY  1965    Allergies: Peach [prunus persica]  Medications: Prior to Admission medications   Medication Sig Start Date End Date Taking? Authorizing Provider  albuterol (VENTOLIN HFA) 108 (90 Base) MCG/ACT inhaler Inhale 1-2 puffs into the lungs every 6 (six) hours as needed for wheezing or shortness of breath.    [provider]  aspirin 81 MG tablet Take 81 mg by mouth daily.    [provider]  famotidine (PEPCID) 20 MG tablet Take 1 tablet (20 mg total) by mouth 2 (two) times daily. 01/19/19   Mesner, Corene Cornea, MD  lisinopril (PRINIVIL,ZESTRIL) 10 MG tablet Take 10 mg by mouth daily.    [provider]  metFORMIN (GLUCOPHAGE) 1000 MG tablet Take 1,000 mg by mouth 2 (two) times daily with a meal. 10/11/18   [provider]  pantoprazole (PROTONIX) 20 MG tablet Take 1 tablet (20 mg total) by mouth daily. Patient not taking: Reported on 02/04/2019 01/19/19   Mesner, Corene Cornea, MD  pravastatin (PRAVACHOL) 20 MG tablet Take 20 mg by mouth at bedtime.     [provider]  predniSONE (DELTASONE) 20 MG tablet Take 2 tablets daily with breakfast. 07/01/21   Jaynee Eagles, PA-C  tiZANidine (ZANAFLEX) 4 MG tablet Take 1 tablet (4 mg total) by mouth at bedtime. 07/01/21   Jaynee Eagles, PA-C  Family History  Problem Relation Age of Onset   Alcohol abuse Mother        old age   Colon cancer Father        diagnosed in his early 25s   Breast cancer Daughter     Social History   Socioeconomic History   Marital status: Married    Spouse name: Not on file   Number of children: Not on file   Years of education: Not on file   Highest education level: Not on file  Occupational History   Not on file  Tobacco Use   Smoking status: Former    Types: Cigarettes    Quit date: 12/21/1994    Years since quitting: 27.0   Smokeless tobacco: Never  Vaping Use   Vaping Use: Never used  Substance and Sexual Activity    Alcohol use: No    Alcohol/week: 0.0 standard drinks of alcohol    Comment: quit drinking about 1996   Drug use: No   Sexual activity: Never  Other Topics Concern   Not on file  Social History Narrative   Not on file   Social Determinants of Health   Financial Resource Strain: Not on file  Food Insecurity: Not on file  Transportation Needs: Not on file  Physical Activity: Not on file  Stress: Not on file  Social Connections: Not on file     Review of Systems  Review of Systems: A 12 point ROS discussed and pertinent positives are indicated in the HPI above.  All other systems are negative.      Physical Exam No direct physical exam was performed   Telehealth visit only today to review annual surveillance imaging Vital Signs: There were no vitals taken for this visit.  Imaging: CT ABDOMEN W WO CONTRAST  Result Date: 12/24/2021 CLINICAL DATA:  Follow-up cryo ablation; * Tracking Code: BO * EXAM: CT ABDOMEN WITHOUT AND WITH CONTRAST TECHNIQUE: Multidetector CT imaging of the abdomen and pelvis was performed following the standard protocol before and following the bolus administration of intravenous contrast. RADIATION DOSE REDUCTION: This exam was performed according to the departmental dose-optimization program which includes automated exposure control, adjustment of the mA and/or kV according to patient size and/or use of iterative reconstruction technique. CONTRAST:  171m OMNIPAQUE IOHEXOL 300 MG/ML  SOLN COMPARISON:  Abdominal CT dated November 21, 2020 FINDINGS: Lower chest: No acute abnormality. Hepatobiliary: Hepatic steatosis. Simple appearing hepatic cysts, largest is located in the left hepatic lobe. No suspicious liver lesions. Gallbladder is unremarkable. No biliary ductal dilation. Pancreas: Unremarkable. No pancreatic ductal dilatation or surrounding inflammatory changes. Spleen: Normal in size without focal abnormality. Adrenals/Urinary Tract: Bilateral adrenal glands  are unremarkable. Post ablation changes of the upper lobe of the right kidney, unchanged when compared with prior exam. Bilateral simple appearing renal cysts, unchanged compared to prior exam. No hydronephrosis or nephrolithiasis. Stomach/Bowel: Stomach is within normal limits. Appendix appears normal. Diverticulosis. No evidence of bowel wall thickening, distention, or inflammatory changes. Vascular/Lymphatic: Pathologically enlarged lymph nodes seen in the abdomen. Normal caliber thoracic aorta with atherosclerotic disease. Other: No abdominal wall hernia or abnormality. Uterus is unremarkable. Musculoskeletal: No acute or significant osseous findings. IMPRESSION: 1. Stable cryo ablation changes of the upper pole of the right kidney. No evidence of recurrent or metastatic disease. 2. Hepatic steatosis. 3.  Aortic Atherosclerosis (ICD10-I70.0). Electronically Signed   By: LYetta GlassmanM.D.   On: 12/24/2021 14:07    Labs:  CBC: No results for  input(s): "WBC", "HGB", "HCT", "PLT" in the last 8760 hours.  COAGS: No results for input(s): "INR", "APTT" in the last 8760 hours.  BMP: Recent Labs    12/24/21 1330  CREATININE 0.60    LIVER FUNCTION TESTS: No results for input(s): "BILITOT", "AST", "ALT", "ALKPHOS", "PROT", "ALBUMIN" in the last 8760 hours.     Assessment and Plan:  Nearly 3 years status post small right renal neoplasm image guided cryoablation.  Surveillance imaging continues to confirm stable ablation defect.  No signs of residual or recurrent disease.  No new renal abnormality or acute process.  Plan: Continue annual surveillance (July 2024 at [facility] for total of 5 years     Electronically Signed: Greggory Keen 12/26/2021, 8:55 AM   I spent a total of    25 Minutes in remote  clinical consultation, greater than 50% of which was counseling/coordinating care for this patient status post right renal.    Visit type: Audio only (telephone). Audio (no video)  only due to patient's lack of internet/smartphone capability. Alternative for in-person consultation at [facility], [address] [place], [place], [place]. This visit type was conducted due to national recommendations for restrictions regarding the COVID-19 Pandemic (e.g. social distancing).  This format is felt to be most appropriate for this patient at this time.  All issues noted in this document were discussed and addressed.

## 2022-01-23 DIAGNOSIS — Z0001 Encounter for general adult medical examination with abnormal findings: Secondary | ICD-10-CM | POA: Diagnosis not present

## 2022-01-23 DIAGNOSIS — E119 Type 2 diabetes mellitus without complications: Secondary | ICD-10-CM | POA: Diagnosis not present

## 2022-01-23 DIAGNOSIS — C641 Malignant neoplasm of right kidney, except renal pelvis: Secondary | ICD-10-CM | POA: Diagnosis not present

## 2022-01-23 DIAGNOSIS — E559 Vitamin D deficiency, unspecified: Secondary | ICD-10-CM | POA: Diagnosis not present

## 2022-01-23 DIAGNOSIS — D649 Anemia, unspecified: Secondary | ICD-10-CM | POA: Diagnosis not present

## 2022-01-23 DIAGNOSIS — D518 Other vitamin B12 deficiency anemias: Secondary | ICD-10-CM | POA: Diagnosis not present

## 2022-01-23 DIAGNOSIS — I10 Essential (primary) hypertension: Secondary | ICD-10-CM | POA: Diagnosis not present

## 2022-01-23 DIAGNOSIS — R7309 Other abnormal glucose: Secondary | ICD-10-CM | POA: Diagnosis not present

## 2022-01-23 DIAGNOSIS — M1991 Primary osteoarthritis, unspecified site: Secondary | ICD-10-CM | POA: Diagnosis not present

## 2022-01-23 DIAGNOSIS — E039 Hypothyroidism, unspecified: Secondary | ICD-10-CM | POA: Diagnosis not present

## 2022-01-31 DIAGNOSIS — D518 Other vitamin B12 deficiency anemias: Secondary | ICD-10-CM | POA: Diagnosis not present

## 2022-02-11 ENCOUNTER — Encounter: Payer: Self-pay | Admitting: *Deleted

## 2022-02-18 DIAGNOSIS — D649 Anemia, unspecified: Secondary | ICD-10-CM | POA: Diagnosis not present

## 2022-02-24 DIAGNOSIS — D518 Other vitamin B12 deficiency anemias: Secondary | ICD-10-CM | POA: Diagnosis not present

## 2022-03-04 NOTE — Progress Notes (Unsigned)
GI Office Note    Referring Provider: Redmond School, MD Primary Care Physician:  Redmond School, MD  Primary Gastroenterologist:  Chief Complaint   No chief complaint on file.   History of Present Illness   Kristine Zamora is a 76 y.o. female presenting today for consideration of surveillance colonoscopy.  Last colonoscopy was in 2016, she had numerous tubular adenomas removed and was advised to come back in 3 years.    CT abdomen with and without contrast July 2023: Hepatic steatosis, simple appearing hepatic cyst.  Post ablation changes of upper lobe of the right kidney unchanged when compared to 2022 study.   Medications   Current Outpatient Medications  Medication Sig Dispense Refill   albuterol (VENTOLIN HFA) 108 (90 Base) MCG/ACT inhaler Inhale 1-2 puffs into the lungs every 6 (six) hours as needed for wheezing or shortness of breath.     aspirin 81 MG tablet Take 81 mg by mouth daily.     famotidine (PEPCID) 20 MG tablet Take 1 tablet (20 mg total) by mouth 2 (two) times daily. 20 tablet 0   lisinopril (PRINIVIL,ZESTRIL) 10 MG tablet Take 10 mg by mouth daily.     metFORMIN (GLUCOPHAGE) 1000 MG tablet Take 1,000 mg by mouth 2 (two) times daily with a meal.     pantoprazole (PROTONIX) 20 MG tablet Take 1 tablet (20 mg total) by mouth daily. (Patient not taking: Reported on 02/04/2019) 10 tablet 0   pravastatin (PRAVACHOL) 20 MG tablet Take 20 mg by mouth at bedtime.      predniSONE (DELTASONE) 20 MG tablet Take 2 tablets daily with breakfast. 10 tablet 0   tiZANidine (ZANAFLEX) 4 MG tablet Take 1 tablet (4 mg total) by mouth at bedtime. 30 tablet 0   No current facility-administered medications for this visit.    Allergies   Allergies as of 03/05/2022 - Review Complete 07/01/2021  Allergen Reaction Noted   Peach [prunus persica] Anaphylaxis 11/08/2013     Past Medical History   Past Medical History:  Diagnosis Date   Anemia    Arthritis    HANDS AND  FEET   Asthma    Back pain    Diabetes mellitus without complication ([place])    TYPE 2   Dyspnea    with exersion    Hypertension    Lichen planus atrophicus    PONV (postoperative nausea and vomiting)    Right renal mass     Past Surgical History   Past Surgical History:  Procedure Laterality Date   BREAST SURGERY  1966   MILK TUBE REMOVED   COLONOSCOPY  2008   Dr. Gala Romney: pancolonic diverticula, hyperplastic rectosigmoid polyp   COLONOSCOPY N/A 01/16/2015   Procedure: COLONOSCOPY;  Surgeon: Daneil Dolin, MD;  Location: [facility];  Service: Endoscopy;  Laterality: N/A;  0900   IR RADIOLOGIST EVAL & MGMT  01/19/2019   IR RADIOLOGIST EVAL & MGMT  03/16/2019   IR RADIOLOGIST EVAL & MGMT  06/15/2019   IR RADIOLOGIST EVAL & MGMT  12/22/2019   IR RADIOLOGIST EVAL & MGMT  12/26/2020   IR RADIOLOGIST EVAL & MGMT  12/26/2021   RADIOLOGY WITH ANESTHESIA Right 02/16/2019   Procedure: CT CRYOABLATION RIGHT RENAL MASS;  Surgeon: Greggory Keen, MD;  Location: [facility];  Service: Anesthesiology;  Laterality: Right;   TONSILLECTOMY  1965    Past Family History   Family History  Problem Relation Age of Onset   Alcohol abuse Mother  old age   Colon cancer Father        diagnosed in his early 30s   Breast cancer Daughter     Past Social History   Social History   Socioeconomic History   Marital status: Married    Spouse name: Not on file   Number of children: Not on file   Years of education: Not on file   Highest education level: Not on file  Occupational History   Not on file  Tobacco Use   Smoking status: Former    Types: Cigarettes    Quit date: 12/21/1994    Years since quitting: 27.2   Smokeless tobacco: Never  Vaping Use   Vaping Use: Never used  Substance and Sexual Activity   Alcohol use: No    Alcohol/week: 0.0 standard drinks of alcohol    Comment: quit drinking about 1996   Drug use: No   Sexual activity: Never  Other Topics Concern   Not on file   Social History Narrative   Not on file   Social Determinants of Health   Financial Resource Strain: Not on file  Food Insecurity: Not on file  Transportation Needs: Not on file  Physical Activity: Not on file  Stress: Not on file  Social Connections: Not on file  Intimate Partner Violence: Not on file    Review of Systems   General: Negative for anorexia, weight loss, fever, chills, fatigue, weakness. ENT: Negative for hoarseness, difficulty swallowing , nasal congestion. CV: Negative for chest pain, angina, palpitations, dyspnea on exertion, peripheral edema.  Respiratory: Negative for dyspnea at rest, dyspnea on exertion, cough, sputum, wheezing.  GI: See history of present illness. GU:  Negative for dysuria, hematuria, urinary incontinence, urinary frequency, nocturnal urination.  Endo: Negative for unusual weight change.     Physical Exam   There were no vitals taken for this visit.   General: Well-nourished, well-developed in no acute distress.  Eyes: No icterus. Mouth: Oropharyngeal mucosa moist and pink , no lesions erythema or exudate. Lungs: Clear to auscultation bilaterally.  Heart: Regular rate and rhythm, no murmurs rubs or gallops.  Abdomen: Bowel sounds are normal, nontender, nondistended, no hepatosplenomegaly or masses,  no abdominal bruits or hernia , no rebound or guarding.  Rectal: ***  Extremities: No lower extremity edema. No clubbing or deformities. Neuro: Alert and oriented x 4   Skin: Warm and dry, no jaundice.   Psych: Alert and cooperative, normal mood and affect.  Labs   *** Imaging Studies   No results found.  Assessment       PLAN   ***   Laureen Ochs. Bobby Rumpf, [place], [facility]

## 2022-03-05 ENCOUNTER — Encounter: Payer: Self-pay | Admitting: Gastroenterology

## 2022-03-05 ENCOUNTER — Ambulatory Visit (INDEPENDENT_AMBULATORY_CARE_PROVIDER_SITE_OTHER): Payer: Medicare Other | Admitting: Gastroenterology

## 2022-03-05 VITALS — BP 147/76 | HR 92 | Temp 98.0°F | Ht 62.0 in | Wt 217.0 lb

## 2022-03-05 DIAGNOSIS — Z86010 Personal history of colonic polyps: Secondary | ICD-10-CM | POA: Diagnosis not present

## 2022-03-05 DIAGNOSIS — Z800 Family history of malignant neoplasm of digestive organs: Secondary | ICD-10-CM

## 2022-03-05 DIAGNOSIS — D649 Anemia, unspecified: Secondary | ICD-10-CM | POA: Diagnosis not present

## 2022-03-05 NOTE — Patient Instructions (Signed)
Colonoscopy to be scheduled with Dr. Gala Romney.

## 2022-03-06 ENCOUNTER — Telehealth: Payer: Self-pay | Admitting: *Deleted

## 2022-03-06 ENCOUNTER — Encounter: Payer: Self-pay | Admitting: *Deleted

## 2022-03-06 MED ORDER — NA SULFATE-K SULFATE-MG SULF 17.5-3.13-1.6 GM/177ML PO SOLN: ORAL | 0 refills | Status: DC

## 2022-03-06 NOTE — Telephone Encounter (Signed)
PA:APPROVED Authorization #: [id number]   DOS: 04/21/22-06/01/22

## 2022-04-15 NOTE — Patient Instructions (Signed)
Kristine Zamora  04/15/2022     '@PREFPERIOPPHARMACY'$ @   Your procedure is scheduled on  04/21/2022.   Report to [facility] at  0830 A.M.   Call this number if you have problems the morning of surgery:  [phone]  If you experience any cold or flu symptoms such as cough, fever, chills, shortness of breath, etc. between now and your scheduled surgery, please notify us at the above number.   Remember:  Follow the diet and prep instructions given to you by the office.       Use your nebulizer and your inhaler before you come and bring your rescue inhaler with you.       DO NOT take any medications for diabetes the morning of your procedure.     Take these medicines the morning of surgery with A SIP OF WATER                                          protonix.     Do not wear jewelry, make-up or nail polish.  Do not wear lotions, powders, or perfumes, or deodorant.  Do not shave 48 hours prior to surgery.  Men may shave face and neck.  Do not bring valuables to the hospital.  [facility] is not responsible for any belongings or valuables.  Contacts, dentures or bridgework may not be worn into surgery.  Leave your suitcase in the car.  After surgery it may be brought to your room.  For patients admitted to the hospital, discharge time will be determined by your treatment team.  Patients discharged the day of surgery will not be allowed to drive home and must have someone with them for 24 hours.    Special instructions:   DO NOT smoke tobacco or vape for 24 hours before your procedure.  Please read over the following fact sheets that you were given. Anesthesia Post-op Instructions and Care and Recovery After Surgery      Colonoscopy, Adult, Care After The following information offers guidance on how to care for yourself after your procedure. Your health care provider may also give you more specific instructions. If you have problems or questions, contact your  health care provider. What can I expect after the procedure? After the procedure, it is common to have: A small amount of blood in your stool for 24 hours after the procedure. Some gas. Mild cramping or bloating of your abdomen. Follow these instructions at home: Eating and drinking  Drink enough fluid to keep your urine pale yellow. Follow instructions from your health care provider about eating or drinking restrictions. Resume your normal diet as told by your health care provider. Avoid heavy or fried foods that are hard to digest. Activity Rest as told by your health care provider. Avoid sitting for a long time without moving. Get up to take short walks every 1-2 hours. This is important to improve blood flow and breathing. Ask for help if you feel weak or unsteady. Return to your normal activities as told by your health care provider. Ask your health care provider what activities are safe for you. Managing cramping and bloating  Try walking around when you have cramps or feel bloated. If directed, apply heat to your abdomen as told by your health care provider. Use the heat source that your health care provider recommends,  such as a moist heat pack or a heating pad. Place a towel between your skin and the heat source. Leave the heat on for 20-30 minutes. Remove the heat if your skin turns bright red. This is especially important if you are unable to feel pain, heat, or cold. You have a greater risk of getting burned. General instructions If you were given a sedative during the procedure, it can affect you for several hours. Do not drive or operate machinery until your health care provider says that it is safe. For the first 24 hours after the procedure: Do not sign important documents. Do not drink alcohol. Do your regular daily activities at a slower pace than normal. Eat soft foods that are easy to digest. Take over-the-counter and prescription medicines only as told by your  health care provider. Keep all follow-up visits. This is important. Contact a health care provider if: You have blood in your stool 2-3 days after the procedure. Get help right away if: You have more than a small spotting of blood in your stool. You have large blood clots in your stool. You have swelling of your abdomen. You have nausea or vomiting. You have a fever. You have increasing pain in your abdomen that is not relieved with medicine. These symptoms may be an emergency. Get help right away. Call 911. Do not wait to see if the symptoms will go away. Do not drive yourself to the hospital. Summary After the procedure, it is common to have a small amount of blood in your stool. You may also have mild cramping and bloating of your abdomen. If you were given a sedative during the procedure, it can affect you for several hours. Do not drive or operate machinery until your health care provider says that it is safe. Get help right away if you have a lot of blood in your stool, nausea or vomiting, a fever, or increased pain in your abdomen. This information is not intended to replace advice given to you by your health care provider. Make sure you discuss any questions you have with your health care provider. Document Revised: 01/09/2021 Document Reviewed: 01/09/2021 Elsevier Patient Education  [place] After The following information offers guidance on how to care for yourself after your procedure. Your health care provider may also give you more specific instructions. If you have problems or questions, contact your health care provider. What can I expect after the procedure? After the procedure, it is common to have: Tiredness. Little or no memory about what happened during or after the procedure. Impaired judgment when it comes to making decisions. Nausea or vomiting. Some trouble with balance. Follow these instructions at home: For the time  period you were told by your health care provider:  Rest. Do not participate in activities where you could fall or become injured. Do not drive or use machinery. Do not drink alcohol. Do not take sleeping pills or medicines that cause drowsiness. Do not make important decisions or sign legal documents. Do not take care of children on your own. Medicines Take over-the-counter and prescription medicines only as told by your health care provider. If you were prescribed antibiotics, take them as told by your health care provider. Do not stop using the antibiotic even if you start to feel better. Eating and drinking Follow instructions from your health care provider about what you may eat and drink. Drink enough fluid to keep your urine pale yellow. If you vomit:  Drink clear fluids slowly and in small amounts as you are able. Clear fluids include water, ice chips, low-calorie sports drinks, and fruit juice that has water added to it (diluted fruit juice). Eat light and bland foods in small amounts as you are able. These foods include bananas, applesauce, rice, lean meats, toast, and crackers. General instructions  Have a responsible adult stay with you for the time you are told. It is important to have someone help care for you until you are awake and alert. If you have sleep apnea, surgery and some medicines can increase your risk for breathing problems. Follow instructions from your health care provider about wearing your sleep device: When you are sleeping. This includes during daytime naps. While taking prescription pain medicines, sleeping medicines, or medicines that make you drowsy. Do not use any products that contain nicotine or tobacco. These products include cigarettes, chewing tobacco, and vaping devices, such as e-cigarettes. If you need help quitting, ask your health care provider. Contact a health care provider if: You feel nauseous or vomit every time you eat or drink. You feel  light-headed. You are still sleepy or having trouble with balance after 24 hours. You get a rash. You have a fever. You have redness or swelling around the IV site. Get help right away if: You have trouble breathing. You have new confusion after you get home. These symptoms may be an emergency. Get help right away. Call 911. Do not wait to see if the symptoms will go away. Do not drive yourself to the hospital. This information is not intended to replace advice given to you by your health care provider. Make sure you discuss any questions you have with your health care provider. Document Revised: 10/14/2021 Document Reviewed: 10/14/2021 Elsevier Patient Education  [place].

## 2022-04-17 ENCOUNTER — Encounter (HOSPITAL_COMMUNITY): Payer: Self-pay

## 2022-04-17 ENCOUNTER — Encounter (HOSPITAL_COMMUNITY)
Admission: RE | Admit: 2022-04-17 | Discharge: 2022-04-17 | Disposition: A | Discharge status: Home / Self Care | Payer: Medicare Other | Source: Ambulatory Visit | Attending: Internal Medicine | Admitting: Internal Medicine

## 2022-04-17 VITALS — BP 158/70 | HR 76 | Temp 97.4°F | Resp 18 | Ht 62.0 in | Wt 216.9 lb

## 2022-04-17 DIAGNOSIS — D649 Anemia, unspecified: Secondary | ICD-10-CM | POA: Diagnosis not present

## 2022-04-17 DIAGNOSIS — Z01818 Encounter for other preprocedural examination: Secondary | ICD-10-CM | POA: Insufficient documentation

## 2022-04-17 DIAGNOSIS — I10 Essential (primary) hypertension: Secondary | ICD-10-CM | POA: Diagnosis not present

## 2022-04-17 DIAGNOSIS — E119 Type 2 diabetes mellitus without complications: Secondary | ICD-10-CM | POA: Insufficient documentation

## 2022-04-17 HISTORY — DX: Gastro-esophageal reflux disease without esophagitis: K21.9

## 2022-04-17 LAB — BASIC METABOLIC PANEL
Anion gap: 9 (ref 5–15)
BUN: 9 mg/dL (ref 8–23)
CO2: 30 mmol/L (ref 22–32)
Calcium: 8.4 mg/dL — ABNORMAL LOW (ref 8.9–10.3)
Chloride: 101 mmol/L (ref 98–111)
Creatinine, Ser: 0.6 mg/dL (ref 0.44–1.00)
GFR, Estimated: >60 mL/min (ref >60)
Glucose, Bld: 173 mg/dL — ABNORMAL HIGH (ref 70–99)
Potassium: 3.1 mmol/L — ABNORMAL LOW (ref 3.5–5.1)
Sodium: 140 mmol/L (ref 135–145)

## 2022-04-17 LAB — CBC WITH DIFFERENTIAL/PLATELET
Abs Immature Granulocytes: 0.02 10*3/uL (ref 0.00–0.07)
Basophils Absolute: 0 10*3/uL (ref 0.0–0.1)
Basophils Relative: 1 %
Eosinophils Absolute: 0.1 10*3/uL (ref 0.0–0.5)
Eosinophils Relative: 3 %
HCT: 36 % (ref 36.0–46.0)
Hemoglobin: 11.6 g/dL — ABNORMAL LOW (ref 12.0–15.0)
Immature Granulocytes: 0 %
Lymphocytes Relative: 26 %
Lymphs Abs: 1.2 10*3/uL (ref 0.7–4.0)
MCH: 28.1 pg (ref 26.0–34.0)
MCHC: 32.2 g/dL (ref 30.0–36.0)
MCV: 87.2 fL (ref 80.0–100.0)
Monocytes Absolute: 0.3 10*3/uL (ref 0.1–1.0)
Monocytes Relative: 6 %
Neutro Abs: 3.1 10*3/uL (ref 1.7–7.7)
Neutrophils Relative %: 64 %
Platelets: 194 10*3/uL (ref 150–400)
RBC: 4.13 MIL/uL (ref 3.87–5.11)
RDW: 14.6 % (ref 11.5–15.5)
WBC: 4.8 10*3/uL (ref 4.0–10.5)
nRBC: 0 % (ref 0.0–0.2)

## 2022-04-18 ENCOUNTER — Other Ambulatory Visit: Payer: Self-pay

## 2022-04-18 MED ORDER — POTASSIUM CHLORIDE CRYS ER 20 MEQ PO TBCR: 20.0000 meq | EXTENDED_RELEASE_TABLET | Freq: Every day | ORAL | 0 refills | Status: DC

## 2022-04-21 ENCOUNTER — Ambulatory Visit (HOSPITAL_BASED_OUTPATIENT_CLINIC_OR_DEPARTMENT_OTHER): Payer: Medicare Other | Admitting: Anesthesiology

## 2022-04-21 ENCOUNTER — Ambulatory Visit (HOSPITAL_COMMUNITY): Payer: Medicare Other | Admitting: Anesthesiology

## 2022-04-21 ENCOUNTER — Other Ambulatory Visit: Payer: Self-pay

## 2022-04-21 ENCOUNTER — Encounter (HOSPITAL_COMMUNITY)
Admission: RE | Disposition: A | Discharge status: Home / Self Care | Payer: Self-pay | Source: Home / Self Care | Attending: Internal Medicine

## 2022-04-21 ENCOUNTER — Ambulatory Visit (HOSPITAL_COMMUNITY)
Admission: RE | Admit: 2022-04-21 | Discharge: 2022-04-21 | Disposition: A | Discharge status: Home / Self Care | Payer: Medicare Other | Attending: Internal Medicine | Admitting: Internal Medicine

## 2022-04-21 ENCOUNTER — Encounter (HOSPITAL_COMMUNITY): Payer: Self-pay | Admitting: Internal Medicine

## 2022-04-21 DIAGNOSIS — K640 First degree hemorrhoids: Secondary | ICD-10-CM | POA: Diagnosis not present

## 2022-04-21 DIAGNOSIS — D124 Benign neoplasm of descending colon: Secondary | ICD-10-CM | POA: Diagnosis not present

## 2022-04-21 DIAGNOSIS — Z1211 Encounter for screening for malignant neoplasm of colon: Secondary | ICD-10-CM | POA: Diagnosis not present

## 2022-04-21 DIAGNOSIS — Z800 Family history of malignant neoplasm of digestive organs: Secondary | ICD-10-CM | POA: Diagnosis not present

## 2022-04-21 DIAGNOSIS — K635 Polyp of colon: Secondary | ICD-10-CM | POA: Diagnosis not present

## 2022-04-21 DIAGNOSIS — D128 Benign neoplasm of rectum: Secondary | ICD-10-CM | POA: Diagnosis not present

## 2022-04-21 DIAGNOSIS — Z86010 Personal history of colonic polyps: Secondary | ICD-10-CM

## 2022-04-21 DIAGNOSIS — Z09 Encounter for follow-up examination after completed treatment for conditions other than malignant neoplasm: Secondary | ICD-10-CM

## 2022-04-21 DIAGNOSIS — D120 Benign neoplasm of cecum: Secondary | ICD-10-CM | POA: Diagnosis not present

## 2022-04-21 DIAGNOSIS — K5730 Diverticulosis of large intestine without perforation or abscess without bleeding: Secondary | ICD-10-CM | POA: Insufficient documentation

## 2022-04-21 DIAGNOSIS — K219 Gastro-esophageal reflux disease without esophagitis: Secondary | ICD-10-CM | POA: Diagnosis not present

## 2022-04-21 DIAGNOSIS — K621 Rectal polyp: Secondary | ICD-10-CM | POA: Diagnosis not present

## 2022-04-21 DIAGNOSIS — Z87891 Personal history of nicotine dependence: Secondary | ICD-10-CM | POA: Insufficient documentation

## 2022-04-21 DIAGNOSIS — Z6839 Body mass index (BMI) 39.0-39.9, adult: Secondary | ICD-10-CM | POA: Insufficient documentation

## 2022-04-21 DIAGNOSIS — D122 Benign neoplasm of ascending colon: Secondary | ICD-10-CM

## 2022-04-21 DIAGNOSIS — E119 Type 2 diabetes mellitus without complications: Secondary | ICD-10-CM | POA: Insufficient documentation

## 2022-04-21 HISTORY — PX: POLYPECTOMY: SHX5525

## 2022-04-21 HISTORY — PX: COLONOSCOPY WITH PROPOFOL: SHX5780

## 2022-04-21 LAB — GLUCOSE, CAPILLARY: Glucose-Capillary: 108 mg/dL — ABNORMAL HIGH (ref 70–99)

## 2022-04-21 SURGERY — COLONOSCOPY WITH PROPOFOL
Anesthesia: General

## 2022-04-21 MED ORDER — STERILE WATER FOR IRRIGATION IR SOLN
Status: DC | PRN
  Administered 2022-04-21: 120 mL

## 2022-04-21 MED ORDER — LACTATED RINGERS IV SOLN: INTRAVENOUS | Status: DC

## 2022-04-21 MED ORDER — PROPOFOL 500 MG/50ML IV EMUL
INTRAVENOUS | Status: DC | PRN
  Administered 2022-04-21: 150 ug/kg/min via INTRAVENOUS

## 2022-04-21 MED ORDER — PROPOFOL 10 MG/ML IV BOLUS
INTRAVENOUS | Status: DC | PRN
  Administered 2022-04-21: 50 mg via INTRAVENOUS

## 2022-04-21 MED ORDER — LACTATED RINGERS IV SOLN: INTRAVENOUS | Status: DC | PRN

## 2022-04-21 NOTE — Discharge Instructions (Signed)
  Colonoscopy Discharge Instructions  Read the instructions outlined below and refer to this sheet in the next few weeks. These discharge instructions provide you with general information on caring for yourself after you leave the hospital. Your doctor may also give you specific instructions. While your treatment has been planned according to the most current medical practices available, unavoidable complications occasionally occur. If you have any problems or questions after discharge, call Dr. Gala Romney at [phone]. ACTIVITY You may resume your regular activity, but move at a slower pace for the next 24 hours.  Take frequent rest periods for the next 24 hours.  Walking will help get rid of the air and reduce the bloated feeling in your belly (abdomen).  No driving for 24 hours (because of the medicine (anesthesia) used during the test).   Do not sign any important legal documents or operate any machinery for 24 hours (because of the anesthesia used during the test).  NUTRITION Drink plenty of fluids.  You may resume your normal diet as instructed by your doctor.  Begin with a light meal and progress to your normal diet. Heavy or fried foods are harder to digest and may make you feel sick to your stomach (nauseated).  Avoid alcoholic beverages for 24 hours or as instructed.  MEDICATIONS You may resume your normal medications unless your doctor tells you otherwise.  WHAT YOU CAN EXPECT TODAY Some feelings of bloating in the abdomen.  Passage of more gas than usual.  Spotting of blood in your stool or on the toilet paper.  IF YOU HAD POLYPS REMOVED DURING THE COLONOSCOPY: No aspirin products for 7 days or as instructed.  No alcohol for 7 days or as instructed.  Eat a soft diet for the next 24 hours.  FINDING OUT THE RESULTS OF YOUR TEST Not all test results are available during your visit. If your test results are not back during the visit, make an appointment with your caregiver to find out the  results. Do not assume everything is normal if you have not heard from your caregiver or the medical facility. It is important for you to follow up on all of your test results.  SEEK IMMEDIATE MEDICAL ATTENTION IF: You have more than a spotting of blood in your stool.  Your belly is swollen (abdominal distention).  You are nauseated or vomiting.  You have a temperature over 101.  You have abdominal pain or discomfort that is severe or gets worse throughout the day.     Multiple polyps (7) removed from your colon.  Colon polyp and diverticulosis information provided   further recommendations to follow pending review of pathology report    At patient request,  I called Wandra Feinstein at [phone] answer.

## 2022-04-21 NOTE — Transfer of Care (Signed)
Immediate Anesthesia Transfer of Care Note  Patient: Kristine Zamora  Procedure(s) Performed: COLONOSCOPY WITH PROPOFOL POLYPECTOMY  Patient Location: Short Stay  Anesthesia Type:General  Level of Consciousness: awake, alert , oriented, and patient cooperative  Airway & Oxygen Therapy: Patient Spontanous Breathing  Post-op Assessment: Report given to RN, Post -op Vital signs reviewed and stable, and Patient moving all extremities X 4  Post vital signs: Reviewed and stable  Last Vitals:  Vitals Value Taken Time  BP 116/97 04/21/22 1137  Temp 36.9 C 04/21/22 1137  Pulse 96 04/21/22 1137  Resp 18 04/21/22 1137  SpO2 95 % 04/21/22 1137    Last Pain:  Vitals:   04/21/22 1137  TempSrc: Oral  PainSc: 0-No pain         Complications: No notable events documented.

## 2022-04-21 NOTE — H&P (Signed)
$'@LOGO'y$ @   Primary Care Physician:  Redmond School, MD Primary Gastroenterologist:  Dr. Gala Romney  Pre-Procedure History & Physical: HPI:  Kristine Zamora is a 76 y.o. female here for  surveillance colonoscopy.  History multiple colonic adenomas removed 2000 and2016; father with colon cancer at a young age.  No bowel symptoms at this time.  Past Medical History:  Diagnosis Date   Anemia    Arthritis    HANDS AND FEET   Asthma    Back pain    Diabetes mellitus without complication ([facility])    TYPE 2   Dyspnea    with exersion    GERD (gastroesophageal reflux disease)    Hypertension    Lichen planus atrophicus    PONV (postoperative nausea and vomiting)    Right renal mass     Past Surgical History:  Procedure Laterality Date   BREAST SURGERY  1966   MILK TUBE REMOVED   COLONOSCOPY  2008   Dr. Gala Romney: pancolonic diverticula, hyperplastic rectosigmoid polyp   COLONOSCOPY N/A 01/16/2015   Procedure: COLONOSCOPY;  Surgeon: Daneil Dolin, MD;  [facility];  Service: Endoscopy;  Laterality: N/A;  0900   IR RADIOLOGIST EVAL & MGMT  01/19/2019   IR RADIOLOGIST EVAL & MGMT  03/16/2019   IR RADIOLOGIST EVAL & MGMT  06/15/2019   IR RADIOLOGIST EVAL & MGMT  12/22/2019   IR RADIOLOGIST EVAL & MGMT  12/26/2020   IR RADIOLOGIST EVAL & MGMT  12/26/2021   RADIOLOGY WITH ANESTHESIA Right 02/16/2019   Procedure: CT CRYOABLATION RIGHT RENAL MASS;  Surgeon: Greggory Keen, MD;  Location: [facility];  Service: Anesthesiology;  Laterality: Right;   TONSILLECTOMY  1965    Prior to Admission medications   Medication Sig Start Date End Date Taking? Authorizing Provider  acetaminophen (TYLENOL) 500 MG tablet Take 500-1,000 mg by mouth every 6 (six) hours as needed (headaches/pain.).   Yes [provider]  albuterol (ACCUNEB) 1.25 MG/3ML nebulizer solution Take 1 ampule by nebulization every 6 (six) hours as needed (cold symptoms.). 12/24/21  Yes [provider]  albuterol (VENTOLIN  HFA) 108 (90 Base) MCG/ACT inhaler Inhale 1-2 puffs into the lungs every 6 (six) hours as needed for wheezing or shortness of breath.   Yes [provider]  aspirin EC 81 MG tablet Take 81 mg by mouth in the morning.   Yes [provider]  Cholecalciferol (VITAMIN D3) 50 MCG (2000 UT) TABS Take 2,000 Units by mouth daily.   Yes [provider]  FEROSUL 325 (65 Fe) MG tablet Take 325 mg by mouth 2 (two) times daily. 01/30/22  Yes [provider]  lisinopril (PRINIVIL,ZESTRIL) 10 MG tablet Take 10 mg by mouth in the morning.   Yes [provider]  metFORMIN (GLUCOPHAGE) 1000 MG tablet Take 1,000 mg by mouth in the morning and at bedtime. 10/11/18  Yes [provider]  Na Sulfate-K Sulfate-Mg Sulf 17.5-3.13-1.6 GM/177ML SOLN As directed 03/06/22  Yes Suriah Peragine, Cristopher Estimable, MD  pantoprazole (PROTONIX) 40 MG tablet Take 40 mg by mouth 2 (two) times daily. 02/20/22  Yes [provider]  potassium chloride SA (KLOR-CON M) 20 MEQ tablet Take 1 tablet (20 mEq total) by mouth daily. 04/18/22  Yes Aicha Clingenpeel, Cristopher Estimable, MD  pravastatin (PRAVACHOL) 20 MG tablet Take 20 mg by mouth at bedtime.    Yes [provider]    Allergies as of 03/06/2022 - Review Complete 03/05/2022  Allergen Reaction Noted   Peach [  prunus persica] Anaphylaxis 11/08/2013    Family History  Problem Relation Age of Onset   Alcohol abuse Mother        old age   Colon cancer Father        diagnosed in his early 66s   Breast cancer Daughter     Social History   Socioeconomic History   Marital status: Married    Spouse name: Not on file   Number of children: Not on file   Years of education: Not on file   Highest education level: Not on file  Occupational History   Not on file  Tobacco Use   Smoking status: Former    Types: Cigarettes    Quit date: 12/21/1994    Years since quitting: 27.3   Smokeless tobacco: Never  Vaping Use   Vaping Use: Never used  Substance  and Sexual Activity   Alcohol use: No    Alcohol/week: 0.0 standard drinks of alcohol    Comment: quit drinking about 1996   Drug use: No   Sexual activity: Not Currently  Other Topics Concern   Not on file  Social History Narrative   Not on file   Social Determinants of Health   Financial Resource Strain: Not on file  Food Insecurity: Not on file  Transportation Needs: Not on file  Physical Activity: Not on file  Stress: Not on file  Social Connections: Not on file  Intimate Partner Violence: Not on file    Review of Systems: See HPI, otherwise negative ROS  Physical Exam: BP (!) 174/82   Pulse 93   Temp 98.4 F (36.9 C) (Oral)   Resp 19   SpO2 98%  General:   Alert,  Well-developed, well-nourished, pleasant and cooperative in NAD Neck:  Supple; no masses or thyromegaly. No significant cervical adenopathy. Lungs:  Clear throughout to auscultation.   No wheezes, crackles, or rhonchi. No acute distress. Heart:  Regular rate and rhythm; no murmurs, clicks, rubs,  or gallops. Abdomen: Non-distended, normal bowel sounds.  Soft and nontender without appreciable mass or hepatosplenomegaly.  Pulses:  Normal pulses noted. Extremities:  Without clubbing or edema.  Impression/Plan:    76 year old lady with multiple colonic adenomas removed previously; here for a surveillance colonoscopy.  I have offered the patient a surveillance colonoscopy today per plan. The risks, benefits, limitations, alternatives and imponderables have been reviewed with the patient. Questions have been answered. All parties are agreeable.       Notice: This dictation was prepared with Dragon dictation along with smaller phrase technology. Any transcriptional errors that result from this process are unintentional and may not be corrected upon review.

## 2022-04-21 NOTE — Op Note (Signed)
[facility] Patient Name: Kristine Zamora Procedure Date: 04/21/2022 10:43 AM MRN: [id number] Date of Birth: 22-Apr-1946 Attending MD: Norvel Richards , MD, [id number] CSN: [id number] Age: 76 Admit Type: Outpatient Procedure:                Colonoscopy Indications:              High risk colon cancer surveillance: Personal                            history of colonic polyps Providers:                Norvel Richards, MD, Caprice Kluver, Aram Candela Referring MD:              Medicines:                Propofol per Anesthesia Complications:            No immediate complications. Estimated Blood Loss:     Estimated blood loss was minimal. Procedure:                Pre-Anesthesia Assessment:                           - Prior to the procedure, a History and Physical                            was performed, and patient medications and                            allergies were reviewed. The patient's tolerance of                            previous anesthesia was also reviewed. The risks                            and benefits of the procedure and the sedation                            options and risks were discussed with the patient.                            All questions were answered, and informed consent                            was obtained. Prior Anticoagulants: The patient has                            taken no anticoagulant or antiplatelet agents. ASA                            Grade Assessment: III - A patient with severe                            systemic disease. After reviewing the risks and  benefits, the patient was deemed in satisfactory                            condition to undergo the procedure.                           After obtaining informed consent, the colonoscope                            was passed under direct vision. Throughout the                            procedure, the patient's blood pressure, pulse, and                             oxygen saturations were monitored continuously. The                            [id number]) scope was introduced through the                            anus and advanced to the the cecum, identified by                            appendiceal orifice and ileocecal valve. The                            colonoscopy was performed without difficulty. The                            patient tolerated the procedure well. The quality                            of the bowel preparation was adequate. The                            ileocecal valve, appendiceal orifice, and rectum                            were photographed. The ileocecal valve, appendiceal                            orifice, and rectum were photographed. The                            ileocecal valve, appendiceal orifice, and rectum                            were photographed. The entire colon was well                            visualized. Scope In: 11:08:48 AM Scope Out: 11:32:56 AM Scope Withdrawal Time: 0 hours 18 minutes 23 seconds  Total Procedure Duration: 0 hours 24 minutes 8 seconds  Findings:  The perianal and digital rectal examinations were normal.      Scattered medium-mouthed diverticula were found in the entire colon.      Four sessile polyps were found in the mid descending colon and cecum.       The polyps were 3 to 6 mm in size. These polyps were removed with a cold       snare. Resection and retrieval were complete. Estimated blood loss was       minimal.      Three sessile polyps were found in the mid descending colon and proximal       ascending colon. The polyps were 6 to 9 mm in size. These polyps were       removed with a hot snare. Resection and retrieval were complete.       Estimated blood loss: none.      A 9 mm polyp was found in the mid rectum. The polyp was       semi-pedunculated. The polyp was removed with a hot snare. Resection and       retrieval were complete.  Estimated blood loss: none.      Non-bleeding internal hemorrhoids were found during retroflexion. The       hemorrhoids were moderate, medium-sized and Grade I (internal       hemorrhoids that do not prolapse).      The exam was otherwise without abnormality on direct and retroflexion       views. Impression:               - Diverticulosis in the entire examined colon.                           - Four 3 to 6 mm polyps in the mid descending colon                            and in the cecum, removed with a cold snare.                            Resected and retrieved.                           - Three 6 to 9 mm polyps in the mid descending                            colon and in the proximal ascending colon, removed                            with a hot snare. Resected and retrieved.                           - One 9 mm polyp in the mid rectum, removed with a                            hot snare. Resected and retrieved.                           - Non-bleeding internal hemorrhoids.                           -  The examination was otherwise normal on direct                            and retroflexion views. Moderate Sedation:      Moderate (conscious) sedation was personally administered by an       anesthesia professional. The following parameters were monitored: oxygen       saturation, heart rate, blood pressure, respiratory rate, EKG, adequacy       of pulmonary ventilation, and response to care. Recommendation:           - Patient has a contact number available for                            emergencies. The signs and symptoms of potential                            delayed complications were discussed with the                            patient. Return to normal activities tomorrow.                            Written discharge instructions were provided to the                            patient.                           - Advance diet as tolerated.                           -  Continue present medications.                           - Repeat colonoscopy date to be determined after                            pending pathology results are reviewed for                            surveillance.                           - Return to GI office (date not yet determined). Procedure Code(s):        --- Professional ---                           [phone], Colonoscopy, flexible; with removal of                            tumor(s), polyp(s), or other lesion(s) by snare                            technique Diagnosis Code(s):        --- Professional ---  Z86.010, Personal history of colonic polyps                           K64.0, First degree hemorrhoids                           D12.0, Benign neoplasm of cecum                           D12.4, Benign neoplasm of descending colon                           D12.2, Benign neoplasm of ascending colon                           D12.8, Benign neoplasm of rectum                           K57.30, Diverticulosis of large intestine without                            perforation or abscess without bleeding CPT copyright 2022 [employer]. All rights reserved. The codes documented in this report are preliminary and upon coder review may  be revised to meet current compliance requirements. Cristopher Estimable. Patrick Sohm, MD Norvel Richards, MD 04/21/2022 11:46:28 AM This report has been signed electronically. Number of Addenda: 0

## 2022-04-21 NOTE — Anesthesia Preprocedure Evaluation (Signed)
Anesthesia Evaluation  Patient identified by MRN, date of birth, ID band Patient awake    Reviewed: Allergy & Precautions, H&P , NPO status , Patient's Chart, lab work & pertinent test results, reviewed documented beta blocker date and time   History of Anesthesia Complications (+) PONV and history of anesthetic complications  Airway Mallampati: II  TM Distance: >3 FB Neck ROM: full    Dental no notable dental hx.    Pulmonary shortness of breath, asthma , former smoker   Pulmonary exam normal breath sounds clear to auscultation       Cardiovascular Exercise Tolerance: Good hypertension, negative cardio ROS  Rhythm:regular Rate:Normal     Neuro/Psych negative neurological ROS  negative psych ROS   GI/Hepatic Neg liver ROS,GERD  Medicated,,  Endo/Other  diabetes  Morbid obesity  Renal/GU Renal disease  negative genitourinary   Musculoskeletal   Abdominal   Peds  Hematology  (+) Blood dyscrasia, anemia   Anesthesia Other Findings   Reproductive/Obstetrics negative OB ROS                             Anesthesia Physical Anesthesia Plan  ASA: 3  Anesthesia Plan: General   Post-op Pain Management:    Induction:   PONV Risk Score and Plan: Propofol infusion  Airway Management Planned:   Additional Equipment:   Intra-op Plan:   Post-operative Plan:   Informed Consent: I have reviewed the patients History and Physical, chart, labs and discussed the procedure including the risks, benefits and alternatives for the proposed anesthesia with the patient or authorized representative who has indicated his/her understanding and acceptance.     Dental Advisory Given  Plan Discussed with: CRNA  Anesthesia Plan Comments:        Anesthesia Quick Evaluation

## 2022-04-22 LAB — SURGICAL PATHOLOGY

## 2022-04-22 NOTE — Anesthesia Postprocedure Evaluation (Signed)
Anesthesia Post Note  Patient: Kristine Zamora  Procedure(s) Performed: COLONOSCOPY WITH PROPOFOL POLYPECTOMY  Patient location during evaluation: Phase II Anesthesia Type: General Level of consciousness: awake Pain management: pain level controlled Vital Signs Assessment: post-procedure vital signs reviewed and stable Respiratory status: spontaneous breathing and respiratory function stable Cardiovascular status: blood pressure returned to baseline and stable Postop Assessment: no headache and no apparent nausea or vomiting Anesthetic complications: no Comments: Late entry   No notable events documented.   Last Vitals:  Vitals:   04/21/22 0927 04/21/22 1137  BP: (!) 174/82 (!) 116/97  Pulse: 93 96  Resp: 19 18  Temp: 36.9 C 36.9 C  SpO2: 98% 95%    Last Pain:  Vitals:   04/21/22 1137  TempSrc: Oral  PainSc: 0-No pain                 Louann Sjogren

## 2022-04-26 ENCOUNTER — Encounter: Payer: Self-pay | Admitting: Internal Medicine

## 2022-04-29 ENCOUNTER — Encounter (HOSPITAL_COMMUNITY): Payer: Self-pay | Admitting: Internal Medicine

## 2022-05-01 ENCOUNTER — Telehealth: Payer: Self-pay

## 2022-05-01 NOTE — Telephone Encounter (Signed)
We did discuss intermittent loose stools which she reported that she could control with avoiding certain foods and has been occurring for years. Possible food intolerance or IBS.   Let's have her start daily fiber supplement, Benefiber 2 teaspoons daily to try and regular stools.   She can use Imodium '2mg'$  (one tablet) prior to eating out as she previously reported she tended to have diarrhea at this times. Otherwise avoid trigger foods.   Let's make follow up ov with Dr. Gala Romney.

## 2022-05-01 NOTE — Telephone Encounter (Signed)
Pt called wanting to know the results to her recent colonoscopy. I informed her of the results and also let her know that a letter with the results was put in the mail. Pt is wanting to know what to do about the chronic diarrhea that she has that she spoke to you about at the time of her office visit. Pt states that she is hoping that something can be done about that, that she has been mentioning it for years. Please advise.

## 2022-05-02 NOTE — Telephone Encounter (Signed)
Pt was made aware and verbalized understanding. Routing to the front for appt with Dr. Gala Romney.

## 2022-06-09 DIAGNOSIS — E1165 Type 2 diabetes mellitus with hyperglycemia: Secondary | ICD-10-CM | POA: Diagnosis not present

## 2022-06-09 DIAGNOSIS — M1991 Primary osteoarthritis, unspecified site: Secondary | ICD-10-CM | POA: Diagnosis not present

## 2022-06-09 DIAGNOSIS — I700 Atherosclerosis of aorta: Secondary | ICD-10-CM | POA: Diagnosis not present

## 2022-06-09 DIAGNOSIS — J4531 Mild persistent asthma with (acute) exacerbation: Secondary | ICD-10-CM | POA: Diagnosis not present

## 2022-06-09 DIAGNOSIS — I10 Essential (primary) hypertension: Secondary | ICD-10-CM | POA: Diagnosis not present

## 2022-06-09 DIAGNOSIS — J209 Acute bronchitis, unspecified: Secondary | ICD-10-CM | POA: Diagnosis not present

## 2022-06-10 ENCOUNTER — Ambulatory Visit: Payer: Medicare Other | Admitting: Internal Medicine

## 2022-06-13 ENCOUNTER — Ambulatory Visit: Payer: Medicare Other | Admitting: Internal Medicine

## 2022-06-13 ENCOUNTER — Encounter: Payer: Self-pay | Admitting: Internal Medicine

## 2022-06-13 VITALS — BP 137/84 | HR 92 | Temp 97.2°F | Ht 62.0 in | Wt 208.6 lb

## 2022-06-13 DIAGNOSIS — Z86010 Personal history of colonic polyps: Secondary | ICD-10-CM

## 2022-06-13 DIAGNOSIS — K5730 Diverticulosis of large intestine without perforation or abscess without bleeding: Secondary | ICD-10-CM

## 2022-06-13 DIAGNOSIS — K219 Gastro-esophageal reflux disease without esophagitis: Secondary | ICD-10-CM

## 2022-06-13 NOTE — Progress Notes (Unsigned)
Primary Care Physician:  Redmond School, MD Primary Gastroenterologist:  Dr.   Pre-Procedure History & Physical: HPI:  Kristine Zamora is a 77 y.o. female here for evaluation of tendency toward loose stools.  Recently underwent colonoscopy with removal of multiple colonic adenomas; due for surveillance examination.  Historically loose stools 1-3 bowel movements daily sometimes no bowel movement daily.  Rare episodes of incontinence.  Started on Benefiber which has helped considerably (2 teaspoons twice daily).  Reflux symptoms well-controlled on pantoprazole 40 mg twice daily.  No dysphagia.  Past Medical History:  Diagnosis Date   Anemia    Arthritis    HANDS AND FEET   Asthma    Back pain    Diabetes mellitus without complication (HCC)    TYPE 2   Dyspnea    with exersion    GERD (gastroesophageal reflux disease)    Hypertension    Lichen planus atrophicus    PONV (postoperative nausea and vomiting)    Right renal mass     Past Surgical History:  Procedure Laterality Date   BREAST SURGERY  1966   MILK TUBE REMOVED   COLONOSCOPY  2008   Dr. Gala Romney: pancolonic diverticula, hyperplastic rectosigmoid polyp   COLONOSCOPY N/A 01/16/2015   Procedure: COLONOSCOPY;  Surgeon: Daneil Dolin, MD;  Location: [facility];  Service: Endoscopy;  Laterality: N/A;  0900   COLONOSCOPY WITH PROPOFOL N/A 04/21/2022   Procedure: COLONOSCOPY WITH PROPOFOL;  Surgeon: Daneil Dolin, MD;  Location: [facility];  Service: Endoscopy;  Laterality: N/A;  10:30 AM   IR RADIOLOGIST EVAL & MGMT  01/19/2019   IR RADIOLOGIST EVAL & MGMT  03/16/2019   IR RADIOLOGIST EVAL & MGMT  06/15/2019   IR RADIOLOGIST EVAL & MGMT  12/22/2019   IR RADIOLOGIST EVAL & MGMT  12/26/2020   IR RADIOLOGIST EVAL & MGMT  12/26/2021   POLYPECTOMY  04/21/2022   Procedure: POLYPECTOMY;  Surgeon: Daneil Dolin, MD;  Location: [facility];  Service: Endoscopy;;   RADIOLOGY WITH ANESTHESIA Right 02/16/2019   Procedure: CT  CRYOABLATION RIGHT RENAL MASS;  Surgeon: Greggory Keen, MD;  Location: [facility];  Service: Anesthesiology;  Laterality: Right;   TONSILLECTOMY  1965    Prior to Admission medications   Medication Sig Start Date End Date Taking? Authorizing Provider  albuterol (ACCUNEB) 1.25 MG/3ML nebulizer solution Take 1 ampule by nebulization every 6 (six) hours as needed (cold symptoms.). 12/24/21  Yes [provider]  albuterol (VENTOLIN HFA) 108 (90 Base) MCG/ACT inhaler Inhale 1-2 puffs into the lungs every 6 (six) hours as needed for wheezing or shortness of breath.   Yes [provider]  aspirin EC 81 MG tablet Take 81 mg by mouth in the morning.   Yes [provider]  Cholecalciferol (VITAMIN D3) 50 MCG (2000 UT) TABS Take 2,000 Units by mouth daily.   Yes [provider]  lisinopril (PRINIVIL,ZESTRIL) 10 MG tablet Take 10 mg by mouth in the morning.   Yes [provider]  metFORMIN (GLUCOPHAGE) 1000 MG tablet Take 1,000 mg by mouth in the morning and at bedtime. 10/11/18  Yes [provider]  pantoprazole (PROTONIX) 40 MG tablet Take 40 mg by mouth 2 (two) times daily. 02/20/22  Yes [provider]  pravastatin (PRAVACHOL) 20 MG tablet Take 20 mg by mouth at bedtime.    Yes [provider]  SPIRIVA RESPIMAT 2.5 MCG/ACT AERS SMARTSIG:2 Puff(s) Via Inhaler Daily 06/12/22  Yes  [provider]    Allergies as of 06/13/2022 - Review Complete 06/13/2022  Allergen Reaction Noted   Peach [prunus persica] Anaphylaxis 11/08/2013    Family History  Problem Relation Age of Onset   Alcohol abuse Mother        old age   Colon cancer Father        diagnosed in his early 46s   Breast cancer Daughter     Social History   Socioeconomic History   Marital status: Married    Spouse name: Not on file   Number of children: Not on file   Years of education: Not on file   Highest education level: Not on file  Occupational History    Not on file  Tobacco Use   Smoking status: Former    Types: Cigarettes    Quit date: 12/21/1994    Years since quitting: 27.4   Smokeless tobacco: Never  Vaping Use   Vaping Use: Never used  Substance and Sexual Activity   Alcohol use: No    Alcohol/week: 0.0 standard drinks of alcohol    Comment: quit drinking about 1996   Drug use: No   Sexual activity: Not Currently  Other Topics Concern   Not on file  Social History Narrative   Not on file   Social Determinants of Health   Financial Resource Strain: Not on file  Food Insecurity: Not on file  Transportation Needs: Not on file  Physical Activity: Not on file  Stress: Not on file  Social Connections: Not on file  Intimate Partner Violence: Not on file    Review of Systems: See HPI, otherwise negative ROS  Physical Exam: BP (!) 161/85 (BP Location: Right Arm, Patient Position: Sitting, Cuff Size: Large)   Pulse 90   Temp (!) 97.2 F (36.2 C) (Oral)   Ht '5\' 2"'$  (1.575 m)   Wt 208 lb 9.6 oz (94.6 kg)   SpO2 97%   BMI 38.15 kg/m  General:   Alert,  Well-developed, well-nourished, pleasant and cooperative in NAD Neck:  Supple; no masses or thyromegaly. No significant cervical adenopathy. Lungs:  Clear throughout to auscultation.   No wheezes, crackles, or rhonchi. No acute distress. Heart:  Regular rate and rhythm; no murmurs, clicks, rubs,  or gallops. Abdomen: Non-distended, normal bowel sounds.  Soft and nontender without appreciable mass or hepatosplenomegaly.  Pulses:  Normal pulses noted. Extremities:  Without clubbing or edema.  Impression/Plan: 78 year old lady with multiple colonic adenomas removed recently.  Tendency towards loose stools rare episodes of incontinence.  Much improved with addition of Benefiber to her regimen.  She would likely get more mileage out of Benefiber if she were to increase dosing.  Slated for surveillance colonoscopy in 3 years  GERD well-controlled on twice daily PPI therapy.  We  may be able to de-escalate to once daily therapy  Recommendations:   I recommend Benefiber be increased to 1 heaping teaspoon in 8 ounces of water twice daily  It is possible Glucophage or metformin may be contributing to loose stools.  However, I do not recommend changing that medication at this time.  Continue taking Protonix 40 mg daily before a meal; may be able to decrease dosing to once daily.  Would leave off  evening dose unless  breakthrough symptoms.  Plan for repeat colonoscopy in 3 years, given multiple polyps removed recently  This visit here in 3 months.   Notice: This dictation was prepared with Dragon dictation along with smaller phrase  technology. Any transcriptional errors that result from this process are unintentional and may not be corrected upon review.

## 2022-06-13 NOTE — Patient Instructions (Addendum)
It was good to see you again today!  I am glad to hear [place] is helping your bowel function.  I recommend Benefiber be increased to 1 heaping teaspoon in 8 ounces of water twice daily  It is possible Glucophage or metformin may be contributing to loose stools.  However, I do not recommend changing that medication at this time.  Continue taking Protonix 40 mg daily before a meal; may be able to decrease dosing to once daily.  Would leave off  evening dose unless you have breakthrough symptoms.  Plan for repeat colonoscopy in 3 years, given multiple polyps removed recently  Pressure elevated today; it will be rechecked.  This visit here in 3 months.

## 2022-06-16 ENCOUNTER — Telehealth: Payer: Self-pay

## 2022-06-16 NOTE — Telephone Encounter (Signed)
Pt was made aware and verbalized understanding.  

## 2022-06-16 NOTE — Telephone Encounter (Signed)
-----  Message from Daneil Dolin, MD sent at 06/14/2022  3:17 PM EST ----- I see an error in pts instructions; she needs to be taking a heaping tablespoon of benefiber BID; not a teaspoon - please let her know.

## 2022-08-14 ENCOUNTER — Encounter: Payer: Self-pay | Admitting: Internal Medicine

## 2022-09-05 DIAGNOSIS — I700 Atherosclerosis of aorta: Secondary | ICD-10-CM | POA: Diagnosis not present

## 2022-09-05 DIAGNOSIS — M47812 Spondylosis without myelopathy or radiculopathy, cervical region: Secondary | ICD-10-CM | POA: Diagnosis not present

## 2022-09-05 DIAGNOSIS — E1165 Type 2 diabetes mellitus with hyperglycemia: Secondary | ICD-10-CM | POA: Diagnosis not present

## 2022-09-05 DIAGNOSIS — I10 Essential (primary) hypertension: Secondary | ICD-10-CM | POA: Diagnosis not present

## 2022-09-05 DIAGNOSIS — J4531 Mild persistent asthma with (acute) exacerbation: Secondary | ICD-10-CM | POA: Diagnosis not present

## 2022-10-02 ENCOUNTER — Other Ambulatory Visit (HOSPITAL_COMMUNITY): Payer: Self-pay | Admitting: Internal Medicine

## 2022-10-02 DIAGNOSIS — Z1231 Encounter for screening mammogram for malignant neoplasm of breast: Secondary | ICD-10-CM

## 2022-10-15 ENCOUNTER — Ambulatory Visit (HOSPITAL_COMMUNITY)
Admission: RE | Admit: 2022-10-15 | Discharge: 2022-10-15 | Disposition: A | Discharge status: Home / Self Care | Payer: Medicare Other | Source: Ambulatory Visit | Attending: Internal Medicine | Admitting: Internal Medicine

## 2022-10-15 DIAGNOSIS — Z1231 Encounter for screening mammogram for malignant neoplasm of breast: Secondary | ICD-10-CM | POA: Insufficient documentation

## 2022-10-20 ENCOUNTER — Other Ambulatory Visit (HOSPITAL_COMMUNITY): Payer: Self-pay | Admitting: Internal Medicine

## 2022-10-20 DIAGNOSIS — R928 Other abnormal and inconclusive findings on diagnostic imaging of breast: Secondary | ICD-10-CM

## 2022-10-23 ENCOUNTER — Ambulatory Visit (HOSPITAL_COMMUNITY)
Admission: RE | Admit: 2022-10-23 | Discharge: 2022-10-23 | Disposition: A | Discharge status: Home / Self Care | Payer: Medicare Other | Source: Ambulatory Visit | Attending: Internal Medicine | Admitting: Internal Medicine

## 2022-10-23 DIAGNOSIS — R928 Other abnormal and inconclusive findings on diagnostic imaging of breast: Secondary | ICD-10-CM | POA: Diagnosis not present

## 2022-10-23 DIAGNOSIS — R921 Mammographic calcification found on diagnostic imaging of breast: Secondary | ICD-10-CM | POA: Diagnosis not present

## 2023-01-05 ENCOUNTER — Other Ambulatory Visit: Payer: Self-pay | Admitting: Interventional Radiology

## 2023-01-05 DIAGNOSIS — N2889 Other specified disorders of kidney and ureter: Secondary | ICD-10-CM

## 2023-02-19 ENCOUNTER — Other Ambulatory Visit (HOSPITAL_COMMUNITY): Payer: Self-pay | Admitting: Internal Medicine

## 2023-02-19 DIAGNOSIS — R921 Mammographic calcification found on diagnostic imaging of breast: Secondary | ICD-10-CM

## 2023-02-25 ENCOUNTER — Ambulatory Visit (HOSPITAL_COMMUNITY)
Admission: RE | Admit: 2023-02-25 | Discharge: 2023-02-25 | Disposition: A | Discharge status: Home / Self Care | Payer: Medicare Other | Source: Ambulatory Visit | Attending: Interventional Radiology | Admitting: Interventional Radiology

## 2023-02-25 DIAGNOSIS — N289 Disorder of kidney and ureter, unspecified: Secondary | ICD-10-CM | POA: Diagnosis not present

## 2023-02-25 DIAGNOSIS — N281 Cyst of kidney, acquired: Secondary | ICD-10-CM | POA: Diagnosis not present

## 2023-02-25 DIAGNOSIS — N2889 Other specified disorders of kidney and ureter: Secondary | ICD-10-CM | POA: Insufficient documentation

## 2023-02-25 DIAGNOSIS — K7689 Other specified diseases of liver: Secondary | ICD-10-CM | POA: Diagnosis not present

## 2023-02-25 LAB — POCT I-STAT CREATININE: Creatinine, Ser: 0.7 mg/dL (ref 0.44–1.00)

## 2023-02-25 MED ORDER — IOHEXOL 300 MG/ML  SOLN
100.0000 mL | Freq: Once | INTRAMUSCULAR | Status: AC | PRN
  Administered 2023-02-25: 100 mL via INTRAVENOUS

## 2023-03-09 ENCOUNTER — Ambulatory Visit
Admission: RE | Admit: 2023-03-09 | Discharge: 2023-03-09 | Disposition: A | Discharge status: Home / Self Care | Payer: Medicare Other | Source: Ambulatory Visit | Attending: Interventional Radiology | Admitting: Interventional Radiology

## 2023-03-09 DIAGNOSIS — N2889 Other specified disorders of kidney and ureter: Secondary | ICD-10-CM

## 2023-03-09 HISTORY — PX: IR RADIOLOGIST EVAL & MGMT: IMG5224

## 2023-03-09 NOTE — Progress Notes (Signed)
Patient ID: Kristine Zamora, female   DOB: [birth year], 77 y.o.   MRN: [id number]       Chief Complaint:  Small right renal neoplasm status post  Referring Physician(s): Dr. Ronne Binning  History of Present Illness: Kristine Zamora is a 77 y.o. 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  Lesion was compatible with a renal neoplasm by imaging criteria and surveillance imaging did show slight enlargement.  She underwent successful image guided cryoablation at [facility] 02/16/2019.  Interval surveillance imaging continues to confirm a stable ablation defect with scarring and retraction.  No signs of abnormal enhancement, residual or recurrent disease.  No new renal abnormality or acute process.  She remains asymptomatic.  No flank or abdominal pain.  No recent illness or fever.  No dysuria or hematuria.  Today's visit is a telehealth visit to review her annual surveillance imaging.  Past Medical History:  Diagnosis Date   Anemia    Arthritis    HANDS AND FEET   Asthma    Back pain    Diabetes mellitus without complication (HCC)    TYPE 2   Dyspnea    with exersion    GERD (gastroesophageal reflux disease)    Hypertension    Lichen planus atrophicus    PONV (postoperative nausea and vomiting)    Right renal mass     Past Surgical History:  Procedure Laterality Date   BREAST SURGERY  1966   MILK TUBE REMOVED   COLONOSCOPY  2008   Dr. Jena Gauss: pancolonic diverticula, hyperplastic rectosigmoid polyp   COLONOSCOPY N/A 01/16/2015   Procedure: COLONOSCOPY;  Surgeon: Corbin Ade, MD;  Location: [facility];  Service: Endoscopy;  Laterality: N/A;  0900   COLONOSCOPY WITH PROPOFOL N/A 04/21/2022   Procedure: COLONOSCOPY WITH PROPOFOL;  Surgeon: Corbin Ade, MD;  Location: [facility];  Service: Endoscopy;  Laterality: N/A;  10:30 AM   IR RADIOLOGIST EVAL & MGMT  01/19/2019   IR RADIOLOGIST EVAL & MGMT  03/16/2019   IR  RADIOLOGIST EVAL & MGMT  06/15/2019   IR RADIOLOGIST EVAL & MGMT  12/22/2019   IR RADIOLOGIST EVAL & MGMT  12/26/2020   IR RADIOLOGIST EVAL & MGMT  12/26/2021   POLYPECTOMY  04/21/2022   Procedure: POLYPECTOMY;  Surgeon: Corbin Ade, MD;  Location: [facility];  Service: Endoscopy;;   RADIOLOGY WITH ANESTHESIA Right 02/16/2019   Procedure: CT CRYOABLATION RIGHT RENAL MASS;  Surgeon: Berdine Dance, MD;  Location: [facility];  Service: Anesthesiology;  Laterality: Right;   TONSILLECTOMY  1965    Allergies: Peach [prunus persica]  Medications: Prior to Admission medications   Medication Sig Start Date End Date Taking? Authorizing Provider  albuterol (ACCUNEB) 1.25 MG/3ML nebulizer solution Take 1 ampule by nebulization every 6 (six) hours as needed (cold symptoms.). 12/24/21   [provider]  albuterol (VENTOLIN HFA) 108 (90 Base) MCG/ACT inhaler Inhale 1-2 puffs into the lungs every 6 (six) hours as needed for wheezing or shortness of breath.    [provider]  aspirin EC 81 MG tablet Take 81 mg by mouth in the morning.    [provider]  Cholecalciferol (VITAMIN D3) 50 MCG (2000 UT) TABS Take 2,000 Units by mouth daily.    [provider]  lisinopril (PRINIVIL,ZESTRIL) 10 MG tablet Take 10 mg by mouth in the morning.    [provider]  metFORMIN (GLUCOPHAGE) 1000 MG  tablet Take 1,000 mg by mouth in the morning and at bedtime. 10/11/18   [provider]  pantoprazole (PROTONIX) 40 MG tablet Take 40 mg by mouth 2 (two) times daily. 02/20/22   [provider]  pravastatin (PRAVACHOL) 20 MG tablet Take 20 mg by mouth at bedtime.     [provider]  SPIRIVA RESPIMAT 2.5 MCG/ACT AERS SMARTSIG:2 Puff(s) Via Inhaler Daily 06/12/22   [provider]     Family History  Problem Relation Age of Onset   Alcohol abuse Mother        old age   Colon cancer Father        diagnosed in his early 57s   Breast cancer  Daughter     Social History   Socioeconomic History   Marital status: Married    Spouse name: Not on file   Number of children: Not on file   Years of education: Not on file   Highest education level: Not on file  Occupational History   Not on file  Tobacco Use   Smoking status: Former    Current packs/day: 0.00    Types: Cigarettes    Quit date: 12/21/1994    Years since quitting: 28.2   Smokeless tobacco: Never  Vaping Use   Vaping status: Never Used  Substance and Sexual Activity   Alcohol use: No    Alcohol/week: 0.0 standard drinks of alcohol    Comment: quit drinking about 1996   Drug use: No   Sexual activity: Not Currently  Other Topics Concern   Not on file  Social History Narrative   Not on file   Social Determinants of Health   Financial Resource Strain: Not on file  Food Insecurity: Not on file  Transportation Needs: Not on file  Physical Activity: Not on file  Stress: Not on file  Social Connections: Not on file    ECOG Status: 0 - Asymptomatic  Review of Systems  Review of Systems: A 12 point ROS discussed and pertinent positives are indicated in the HPI above.  All other systems are negative.     Physical Exam No direct physical exam was performed Telehealth visit only today to review surveillance imaging  Vital Signs: There were no vitals taken for this visit.  Imaging: No results found.  Labs:  CBC: Recent Labs    04/17/22 1046  WBC 4.8  HGB 11.6*  HCT 36.0  PLT 194    COAGS: No results for input(s): "INR", "APTT" in the last 8760 hours.  BMP: Recent Labs    04/17/22 1046 02/25/23 1258  NA 140  --   K 3.1*  --   CL 101  --   CO2 30  --   GLUCOSE 173*  --   BUN 9  --   CALCIUM 8.4*  --   CREATININE 0.60 0.70  GFRNONAA >60  --     LIVER FUNCTION TESTS: No results for input(s): "BILITOT", "AST", "ALT", "ALKPHOS", "PROT", "ALBUMIN" in the last 8760 hours.    Assessment and Plan:  4 years status post small  right renal neoplasm image guided cryoablation.  Annual surveillance imaging confirms stable ablation defect.  No signs of residual recurrent disease.  No new renal abnormality or acute process by CT.  Imaging findings discussed by telehealth visit today.  All questions addressed.  Overall she continues to do well.  Plan: Continue annual surveillance (October 2025 at [facility] which will complete 5 years)  Electronically Signed: Berdine Dance 03/09/2023, 11:09 AM   I spent a total of    25 Minutes in remote  clinical consultation, greater than 50% of which was counseling/coordinating care for This patient status post renal neoplasm cryoablation.    Visit type: Audio only (telephone). Audio (no video) only due to patient's lack of internet/smartphone capability. Alternative for in-person consultation at [facility], [address], [place], [place].  This format is felt to be most appropriate for this patient at this time.  All issues noted in this document were discussed and addressed.

## 2023-04-01 ENCOUNTER — Other Ambulatory Visit (HOSPITAL_COMMUNITY): Payer: Self-pay | Admitting: Internal Medicine

## 2023-04-01 DIAGNOSIS — E782 Mixed hyperlipidemia: Secondary | ICD-10-CM | POA: Diagnosis not present

## 2023-04-01 DIAGNOSIS — M47812 Spondylosis without myelopathy or radiculopathy, cervical region: Secondary | ICD-10-CM | POA: Diagnosis not present

## 2023-04-01 DIAGNOSIS — R519 Headache, unspecified: Secondary | ICD-10-CM | POA: Diagnosis not present

## 2023-04-01 DIAGNOSIS — E039 Hypothyroidism, unspecified: Secondary | ICD-10-CM | POA: Diagnosis not present

## 2023-04-01 DIAGNOSIS — R42 Dizziness and giddiness: Secondary | ICD-10-CM

## 2023-04-01 DIAGNOSIS — E559 Vitamin D deficiency, unspecified: Secondary | ICD-10-CM | POA: Diagnosis not present

## 2023-04-01 DIAGNOSIS — Z0001 Encounter for general adult medical examination with abnormal findings: Secondary | ICD-10-CM | POA: Diagnosis not present

## 2023-04-01 DIAGNOSIS — I700 Atherosclerosis of aorta: Secondary | ICD-10-CM | POA: Diagnosis not present

## 2023-04-01 DIAGNOSIS — D518 Other vitamin B12 deficiency anemias: Secondary | ICD-10-CM | POA: Diagnosis not present

## 2023-04-01 DIAGNOSIS — I10 Essential (primary) hypertension: Secondary | ICD-10-CM | POA: Diagnosis not present

## 2023-04-01 DIAGNOSIS — E1165 Type 2 diabetes mellitus with hyperglycemia: Secondary | ICD-10-CM | POA: Diagnosis not present

## 2023-04-01 DIAGNOSIS — Z23 Encounter for immunization: Secondary | ICD-10-CM | POA: Diagnosis not present

## 2023-04-08 ENCOUNTER — Ambulatory Visit (HOSPITAL_COMMUNITY)
Admission: RE | Admit: 2023-04-08 | Discharge: 2023-04-08 | Disposition: A | Discharge status: Home / Self Care | Payer: Medicare Other | Source: Ambulatory Visit | Attending: Internal Medicine | Admitting: Internal Medicine

## 2023-04-08 DIAGNOSIS — R42 Dizziness and giddiness: Secondary | ICD-10-CM

## 2023-04-08 DIAGNOSIS — R519 Headache, unspecified: Secondary | ICD-10-CM

## 2023-04-28 ENCOUNTER — Ambulatory Visit (HOSPITAL_COMMUNITY)
Admission: RE | Admit: 2023-04-28 | Discharge: 2023-04-28 | Disposition: A | Discharge status: Home / Self Care | Payer: Medicare Other | Source: Ambulatory Visit | Attending: Internal Medicine | Admitting: Internal Medicine

## 2023-04-28 DIAGNOSIS — R92321 Mammographic fibroglandular density, right breast: Secondary | ICD-10-CM | POA: Diagnosis not present

## 2023-04-28 DIAGNOSIS — R928 Other abnormal and inconclusive findings on diagnostic imaging of breast: Secondary | ICD-10-CM | POA: Diagnosis not present

## 2023-04-28 DIAGNOSIS — R921 Mammographic calcification found on diagnostic imaging of breast: Secondary | ICD-10-CM | POA: Diagnosis not present

## 2023-06-10 DIAGNOSIS — R42 Dizziness and giddiness: Secondary | ICD-10-CM | POA: Diagnosis not present

## 2023-07-14 DIAGNOSIS — D649 Anemia, unspecified: Secondary | ICD-10-CM | POA: Diagnosis not present

## 2023-08-07 DIAGNOSIS — Z1211 Encounter for screening for malignant neoplasm of colon: Secondary | ICD-10-CM | POA: Diagnosis not present

## 2023-08-11 DIAGNOSIS — D649 Anemia, unspecified: Secondary | ICD-10-CM | POA: Diagnosis not present

## 2023-08-17 DIAGNOSIS — R5383 Other fatigue: Secondary | ICD-10-CM | POA: Diagnosis not present

## 2023-08-17 DIAGNOSIS — D649 Anemia, unspecified: Secondary | ICD-10-CM | POA: Diagnosis not present

## 2023-08-17 DIAGNOSIS — D509 Iron deficiency anemia, unspecified: Secondary | ICD-10-CM | POA: Diagnosis not present

## 2023-09-09 DIAGNOSIS — M1991 Primary osteoarthritis, unspecified site: Secondary | ICD-10-CM | POA: Diagnosis not present

## 2023-09-09 DIAGNOSIS — D649 Anemia, unspecified: Secondary | ICD-10-CM | POA: Diagnosis not present

## 2023-09-09 DIAGNOSIS — E876 Hypokalemia: Secondary | ICD-10-CM | POA: Diagnosis not present

## 2023-09-09 DIAGNOSIS — E1165 Type 2 diabetes mellitus with hyperglycemia: Secondary | ICD-10-CM | POA: Diagnosis not present

## 2023-09-09 DIAGNOSIS — D509 Iron deficiency anemia, unspecified: Secondary | ICD-10-CM | POA: Diagnosis not present

## 2023-09-09 DIAGNOSIS — R5383 Other fatigue: Secondary | ICD-10-CM | POA: Diagnosis not present

## 2023-09-09 DIAGNOSIS — I10 Essential (primary) hypertension: Secondary | ICD-10-CM | POA: Diagnosis not present

## 2023-09-09 DIAGNOSIS — R42 Dizziness and giddiness: Secondary | ICD-10-CM | POA: Diagnosis not present

## 2023-09-09 DIAGNOSIS — R7309 Other abnormal glucose: Secondary | ICD-10-CM | POA: Diagnosis not present

## 2023-09-10 ENCOUNTER — Other Ambulatory Visit: Payer: Self-pay

## 2023-09-10 ENCOUNTER — Observation Stay (HOSPITAL_COMMUNITY)

## 2023-09-10 ENCOUNTER — Encounter (HOSPITAL_COMMUNITY): Payer: Self-pay | Admitting: Emergency Medicine

## 2023-09-10 ENCOUNTER — Emergency Department (HOSPITAL_COMMUNITY)

## 2023-09-10 ENCOUNTER — Observation Stay (HOSPITAL_COMMUNITY)
Admission: EM | Admit: 2023-09-10 | Discharge: 2023-09-12 | Disposition: A | Discharge status: Home / Self Care | Attending: Internal Medicine | Admitting: Internal Medicine

## 2023-09-10 DIAGNOSIS — E119 Type 2 diabetes mellitus without complications: Secondary | ICD-10-CM | POA: Diagnosis not present

## 2023-09-10 DIAGNOSIS — I10 Essential (primary) hypertension: Secondary | ICD-10-CM | POA: Insufficient documentation

## 2023-09-10 DIAGNOSIS — K219 Gastro-esophageal reflux disease without esophagitis: Secondary | ICD-10-CM | POA: Diagnosis not present

## 2023-09-10 DIAGNOSIS — Z7982 Long term (current) use of aspirin: Secondary | ICD-10-CM | POA: Diagnosis not present

## 2023-09-10 DIAGNOSIS — C641 Malignant neoplasm of right kidney, except renal pelvis: Secondary | ICD-10-CM | POA: Diagnosis not present

## 2023-09-10 DIAGNOSIS — E785 Hyperlipidemia, unspecified: Secondary | ICD-10-CM | POA: Insufficient documentation

## 2023-09-10 DIAGNOSIS — R0602 Shortness of breath: Secondary | ICD-10-CM | POA: Insufficient documentation

## 2023-09-10 DIAGNOSIS — R0600 Dyspnea, unspecified: Secondary | ICD-10-CM | POA: Diagnosis not present

## 2023-09-10 DIAGNOSIS — Z87891 Personal history of nicotine dependence: Secondary | ICD-10-CM | POA: Diagnosis not present

## 2023-09-10 DIAGNOSIS — D649 Anemia, unspecified: Secondary | ICD-10-CM | POA: Diagnosis not present

## 2023-09-10 DIAGNOSIS — Z79899 Other long term (current) drug therapy: Secondary | ICD-10-CM | POA: Insufficient documentation

## 2023-09-10 DIAGNOSIS — J45909 Unspecified asthma, uncomplicated: Secondary | ICD-10-CM | POA: Insufficient documentation

## 2023-09-10 DIAGNOSIS — K5730 Diverticulosis of large intestine without perforation or abscess without bleeding: Secondary | ICD-10-CM | POA: Diagnosis not present

## 2023-09-10 LAB — URINALYSIS, ROUTINE W REFLEX MICROSCOPIC
Bilirubin Urine: NEGATIVE
Glucose, UA: NEGATIVE mg/dL
Hgb urine dipstick: NEGATIVE
Ketones, ur: NEGATIVE mg/dL
Leukocytes,Ua: NEGATIVE
Nitrite: NEGATIVE
Protein, ur: NEGATIVE mg/dL
Specific Gravity, Urine: 1.005 (ref 1.005–1.030)
pH: 5 (ref 5.0–8.0)

## 2023-09-10 LAB — CBC WITH DIFFERENTIAL/PLATELET
Abs Immature Granulocytes: 0.05 10*3/uL (ref 0.00–0.07)
Basophils Absolute: 0 10*3/uL (ref 0.0–0.1)
Basophils Relative: 0 %
Eosinophils Absolute: 0.1 10*3/uL (ref 0.0–0.5)
Eosinophils Relative: 1 %
HCT: 26.4 % — ABNORMAL LOW (ref 36.0–46.0)
Hemoglobin: 7.8 g/dL — ABNORMAL LOW (ref 12.0–15.0)
Immature Granulocytes: 1 %
Lymphocytes Relative: 15 %
Lymphs Abs: 1.2 10*3/uL (ref 0.7–4.0)
MCH: 23.9 pg — ABNORMAL LOW (ref 26.0–34.0)
MCHC: 29.5 g/dL — ABNORMAL LOW (ref 30.0–36.0)
MCV: 81 fL (ref 80.0–100.0)
Monocytes Absolute: 0.4 10*3/uL (ref 0.1–1.0)
Monocytes Relative: 6 %
Neutro Abs: 5.9 10*3/uL (ref 1.7–7.7)
Neutrophils Relative %: 77 %
Platelets: 224 10*3/uL (ref 150–400)
RBC: 3.26 MIL/uL — ABNORMAL LOW (ref 3.87–5.11)
RDW: 17 % — ABNORMAL HIGH (ref 11.5–15.5)
WBC: 7.6 10*3/uL (ref 4.0–10.5)
nRBC: 0 % (ref 0.0–0.2)

## 2023-09-10 LAB — POC OCCULT BLOOD, ED: Fecal Occult Bld: NEGATIVE

## 2023-09-10 LAB — COMPREHENSIVE METABOLIC PANEL WITH GFR
ALT: 13 U/L (ref 0–44)
AST: 27 U/L (ref 15–41)
Albumin: 3.3 g/dL — ABNORMAL LOW (ref 3.5–5.0)
Alkaline Phosphatase: 50 U/L (ref 38–126)
Anion gap: 13 (ref 5–15)
BUN: 14 mg/dL (ref 8–23)
CO2: 21 mmol/L — ABNORMAL LOW (ref 22–32)
Calcium: 9.1 mg/dL (ref 8.9–10.3)
Chloride: 103 mmol/L (ref 98–111)
Creatinine, Ser: 0.82 mg/dL (ref 0.44–1.00)
GFR, Estimated: >60 mL/min (ref >60)
Glucose, Bld: 174 mg/dL — ABNORMAL HIGH (ref 70–99)
Potassium: 3.2 mmol/L — ABNORMAL LOW (ref 3.5–5.1)
Sodium: 137 mmol/L (ref 135–145)
Total Bilirubin: 0.4 mg/dL (ref 0.0–1.2)
Total Protein: 5.9 g/dL — ABNORMAL LOW (ref 6.5–8.1)

## 2023-09-10 LAB — RETICULOCYTES
Immature Retic Fract: 26 % — ABNORMAL HIGH (ref 2.3–15.9)
RBC.: 3.33 MIL/uL — ABNORMAL LOW (ref 3.87–5.11)
Retic Count, Absolute: 75.6 10*3/uL (ref 19.0–186.0)
Retic Ct Pct: 2.3 % (ref 0.4–3.1)

## 2023-09-10 LAB — TROPONIN I (HIGH SENSITIVITY)
Troponin I (High Sensitivity): 8 ng/L (ref <18)
Troponin I (High Sensitivity): 8 ng/L (ref <18)

## 2023-09-10 LAB — PROTIME-INR
INR: 1.1 (ref 0.8–1.2)
Prothrombin Time: 14.1 s (ref 11.4–15.2)

## 2023-09-10 LAB — APTT: aPTT: 25 s (ref 24–36)

## 2023-09-10 LAB — PREPARE RBC (CROSSMATCH)

## 2023-09-10 LAB — IRON AND TIBC
Iron: 16 ug/dL — ABNORMAL LOW (ref 28–170)
Saturation Ratios: 4 % — ABNORMAL LOW (ref 10.4–31.8)
TIBC: 439 ug/dL (ref 250–450)
UIBC: 423 ug/dL

## 2023-09-10 LAB — FOLATE: Folate: 10 ng/mL (ref >5.9)

## 2023-09-10 LAB — FERRITIN: Ferritin: 4 ng/mL — ABNORMAL LOW (ref 11–307)

## 2023-09-10 LAB — VITAMIN B12: Vitamin B-12: 291 pg/mL (ref 180–914)

## 2023-09-10 LAB — GLUCOSE, CAPILLARY: Glucose-Capillary: 118 mg/dL — ABNORMAL HIGH (ref 70–99)

## 2023-09-10 LAB — BRAIN NATRIURETIC PEPTIDE: B Natriuretic Peptide: 395 pg/mL — ABNORMAL HIGH (ref 0.0–100.0)

## 2023-09-10 MED ORDER — FERROUS SULFATE 325 (65 FE) MG PO TABS
325.0000 mg | ORAL_TABLET | Freq: Three times a day (TID) | ORAL | Status: DC
  Administered 2023-09-10 – 2023-09-12 (×5): 325 mg via ORAL
  Filled 2023-09-10 (×5): qty 1

## 2023-09-10 MED ORDER — CYANOCOBALAMIN 1000 MCG/ML IJ SOLN
1000.0000 ug | Freq: Every day | INTRAMUSCULAR | Status: DC
  Administered 2023-09-10 – 2023-09-12 (×3): 1000 ug via SUBCUTANEOUS
  Filled 2023-09-10 (×3): qty 1

## 2023-09-10 MED ORDER — IOHEXOL 300 MG/ML  SOLN
100.0000 mL | Freq: Once | INTRAMUSCULAR | Status: AC | PRN
  Administered 2023-09-10: 100 mL via INTRAVENOUS

## 2023-09-10 MED ORDER — FUROSEMIDE 10 MG/ML IJ SOLN
40.0000 mg | Freq: Once | INTRAMUSCULAR | Status: AC
  Administered 2023-09-10: 40 mg via INTRAVENOUS
  Filled 2023-09-10: qty 4

## 2023-09-10 MED ORDER — ACETAMINOPHEN 325 MG PO TABS: 650.0000 mg | ORAL_TABLET | Freq: Four times a day (QID) | ORAL | Status: DC | PRN

## 2023-09-10 MED ORDER — UMECLIDINIUM BROMIDE 62.5 MCG/ACT IN AEPB
1.0000 | INHALATION_SPRAY | Freq: Every day | RESPIRATORY_TRACT | Status: DC
  Filled 2023-09-10: qty 7

## 2023-09-10 MED ORDER — PANTOPRAZOLE SODIUM 40 MG PO TBEC
40.0000 mg | DELAYED_RELEASE_TABLET | Freq: Two times a day (BID) | ORAL | Status: DC
  Administered 2023-09-10 – 2023-09-12 (×4): 40 mg via ORAL
  Filled 2023-09-10 (×4): qty 1

## 2023-09-10 MED ORDER — ALBUTEROL SULFATE 2 MG PO TABS
2.0000 mg | ORAL_TABLET | Freq: Three times a day (TID) | ORAL | Status: DC | PRN
Start: 2023-09-11 — End: 2023-09-12

## 2023-09-10 MED ORDER — POTASSIUM CHLORIDE CRYS ER 20 MEQ PO TBCR
40.0000 meq | EXTENDED_RELEASE_TABLET | Freq: Once | ORAL | Status: AC
  Administered 2023-09-10: 40 meq via ORAL
  Filled 2023-09-10: qty 2

## 2023-09-10 MED ORDER — SODIUM CHLORIDE 0.9% IV SOLUTION: Freq: Once | INTRAVENOUS | Status: AC

## 2023-09-10 MED ORDER — PRAVASTATIN SODIUM 40 MG PO TABS
20.0000 mg | ORAL_TABLET | Freq: Every day | ORAL | Status: DC
  Administered 2023-09-10 – 2023-09-11 (×2): 20 mg via ORAL
  Filled 2023-09-10 (×2): qty 1

## 2023-09-10 MED ORDER — HYDRALAZINE HCL 20 MG/ML IJ SOLN: 5.0000 mg | INTRAMUSCULAR | Status: DC | PRN

## 2023-09-10 MED ORDER — TIOTROPIUM BROMIDE MONOHYDRATE 2.5 MCG/ACT IN AERS: 2.0000 | INHALATION_SPRAY | Freq: Every day | RESPIRATORY_TRACT | Status: DC

## 2023-09-10 MED ORDER — LISINOPRIL 10 MG PO TABS
10.0000 mg | ORAL_TABLET | Freq: Every morning | ORAL | Status: DC
Start: 2023-09-11 — End: 2023-09-12
  Administered 2023-09-11 – 2023-09-12 (×2): 10 mg via ORAL
  Filled 2023-09-10 (×2): qty 1

## 2023-09-10 MED ORDER — INSULIN ASPART 100 UNIT/ML IJ SOLN: 0.0000 [IU] | Freq: Three times a day (TID) | INTRAMUSCULAR | Status: DC

## 2023-09-10 MED ORDER — ACETAMINOPHEN 650 MG RE SUPP: 650.0000 mg | Freq: Four times a day (QID) | RECTAL | Status: DC | PRN

## 2023-09-10 MED ORDER — HYDRALAZINE HCL 20 MG/ML IJ SOLN: 10.0000 mg | INTRAMUSCULAR | Status: DC | PRN

## 2023-09-10 NOTE — ED Notes (Signed)
 Labs drawn and blood restarted.

## 2023-09-10 NOTE — Progress Notes (Signed)
   09/10/23 2235  TOC Brief Assessment  Insurance and Status Reviewed  Patient has primary care physician Yes  Home environment has been reviewed From home  Prior level of function: Independent  Prior/Current Home Services No current home services  Social Drivers of Health Review SDOH reviewed no interventions necessary  Readmission risk has been reviewed Yes  Transition of care needs no transition of care needs at this time   Transition of Care Department [facility]) has reviewed patient and no other TOC needs have been identified at this time. We will continue to monitor patient advancement through interdisciplinary progression rounds. If new patient needs arise, please place a TOC consult.

## 2023-09-10 NOTE — ED Triage Notes (Signed)
 Pt was sent by Dr. Ignacia Palma office as her iron is low at 6% and doctors office wanted her to go to the ER for labs, possible transfusion, and to check for a GI bleed. Pt states she has not noticed any dark stools or anything abnormal with her Bms. Pt does endorse generalized weakness and shob x last couple of weaks. Denies use of blood thinners

## 2023-09-10 NOTE — ED Provider Notes (Signed)
 [place] EMERGENCY DEPARTMENT AT [facility] Provider Note   CSN: [id number] Arrival date & time: 09/10/23  1235     History  Chief Complaint  Patient presents with   Anemia    Kristine Zamora is a 78 y.o. female.  Patient is a 78 year old female who presents to the emergency department secondary to low iron.  Patient notes that her doctor contacted her this morning directed her to come to the emergency department for further evaluation.  Patient notes that she has been experiencing some generalized weakness as well as shortness of breath.  Patient denies any melena or hematochezia.  She has had no associated nausea or vomiting.  She denies any chest pain or increased lower extremity edema.  She has had no dizziness, lightheadedness or syncope.  Patient denies any associated abdominal pain.   Anemia Associated symptoms include shortness of breath.       Home Medications Prior to Admission medications   Medication Sig Start Date End Date Taking? Authorizing Provider  albuterol (ACCUNEB) 1.25 MG/3ML nebulizer solution Take 1 ampule by nebulization every 6 (six) hours as needed (cold symptoms.). 12/24/21   [provider]  albuterol (VENTOLIN HFA) 108 (90 Base) MCG/ACT inhaler Inhale 1-2 puffs into the lungs every 6 (six) hours as needed for wheezing or shortness of breath.    [provider]  aspirin EC 81 MG tablet Take 81 mg by mouth in the morning.    [provider]  Cholecalciferol (VITAMIN D3) 50 MCG (2000 UT) TABS Take 2,000 Units by mouth daily.    [provider]  lisinopril (PRINIVIL,ZESTRIL) 10 MG tablet Take 10 mg by mouth in the morning.    [provider]  metFORMIN (GLUCOPHAGE) 1000 MG tablet Take 1,000 mg by mouth in the morning and at bedtime. 10/11/18   [provider]  pantoprazole (PROTONIX) 40 MG tablet Take 40 mg by mouth 2 (two) times daily. 02/20/22   [provider]  pravastatin  (PRAVACHOL) 20 MG tablet Take 20 mg by mouth at bedtime.     [provider]  SPIRIVA RESPIMAT 2.5 MCG/ACT AERS SMARTSIG:2 Puff(s) Via Inhaler Daily 06/12/22   [provider]      Allergies    Peach [prunus persica]    Review of Systems   Review of Systems  Constitutional:  Positive for fatigue.  Respiratory:  Positive for shortness of breath.   All other systems reviewed and are negative.   Physical Exam Updated Vital Signs BP (!) 156/65   Pulse 65   Temp 98.4 F (36.9 C) (Oral)   Resp 18   SpO2 98%  Physical Exam Vitals and nursing note reviewed. Exam conducted with a chaperone present.  Constitutional:      Appearance: Normal appearance.  HENT:     Head: Normocephalic and atraumatic.     Nose: Nose normal.     Mouth/Throat:     Mouth: Mucous membranes are moist.  Eyes:     Extraocular Movements: Extraocular movements intact.     Conjunctiva/sclera: Conjunctivae normal.     Pupils: Pupils are equal, round, and reactive to light.  Cardiovascular:     Rate and Rhythm: Normal rate and regular rhythm.     Pulses: Normal pulses.     Heart sounds: Normal heart sounds. No murmur heard.    No gallop.  Pulmonary:     Effort: Pulmonary effort is normal. No respiratory distress.     Breath sounds: Normal breath  sounds. No stridor. No wheezing, rhonchi or rales.  Abdominal:     General: Abdomen is flat. Bowel sounds are normal. There is no distension.     Palpations: Abdomen is soft.     Tenderness: There is no abdominal tenderness. There is no guarding.  Musculoskeletal:        General: Normal range of motion.     Cervical back: Normal range of motion and neck supple.     Comments: Mild edema to bilateral lower extremities  Skin:    General: Skin is warm and dry.  Neurological:     General: No focal deficit present.     Mental Status: She is alert and oriented to person, place, and time. Mental status is at baseline.  Psychiatric:        Mood and  Affect: Mood normal.        Behavior: Behavior normal.        Thought Content: Thought content normal.        Judgment: Judgment normal.     ED Results / Procedures / Treatments   Labs (all labs ordered are listed, but only abnormal results are displayed) Labs Reviewed  COMPREHENSIVE METABOLIC PANEL WITH GFR  CBC WITH DIFFERENTIAL/PLATELET  PROTIME-INR  APTT  BRAIN NATRIURETIC PEPTIDE  TYPE AND SCREEN  TROPONIN I (HIGH SENSITIVITY)    EKG EKG Interpretation Date/Time:  Thursday September 10 2023 13:15:29 EDT Ventricular Rate:  87 PR Interval:  157 QRS Duration:  88 QT Interval:  345 QTC Calculation: 415 R Axis:   44  Text Interpretation: Sinus rhythm Multiple premature complexes, vent & supraven Sinus pause Low voltage, precordial leads Borderline repolarization abnormality No significant change since prior 11/23 Confirmed by Racheal Buddle [phone]) on 09/10/2023 1:20:41 PM  Radiology No results found.  Procedures Procedures    Medications Ordered in ED Medications - No data to display  ED Course/ Medical Decision Making/ A&P Clinical Course as of 09/12/23 1805  Thu Sep 10, 2023  1730 CBC with Differential(!) There is evidence of anemia which is lower than her baseline. [CF]  1731 Troponin I (High Sensitivity) Initial and delta troponin are normal. [CF]  1731 Brain natriuretic peptide(!) Slightly elevated. [CF]  1731 Comprehensive metabolic panel(!) Mildly hypokalemic. [CF]  1731 POC occult blood, ED Occult blood negative. [CF]  1731 Protime-INR Normal.  [CF]  1731 DG Chest [facility] Personally ordered and interpreted the study do not see any evidence of pneumonia.  [CF]  1733 I spoke with Dr. Osborne Blazer [CF]    Clinical Course User Index [CF] Darletta Ehrich, PA-C                                 Medical Decision Making Patient did remain stable at time of signout.  Patient is anemic and most likely will warrant admission and transfusion.  Abdominal  exam was benign on initial presentation with no focal tenderness throughout.  Will sign patient out to [employer], PA-C.   Amount and/or Complexity of Data Reviewed Labs: ordered. Decision-making details documented in ED Course. Radiology: ordered. Decision-making details documented in ED Course.  Risk Prescription drug management. Decision regarding hospitalization.           Final Clinical Impression(s) / ED Diagnoses Final diagnoses:  None    Rx / DC Orders ED Discharge Orders     None         Pearletha Bouche  D, PA-C 09/12/23 1806    Tonya Fredrickson, MD 09/15/23 1038

## 2023-09-10 NOTE — H&P (Signed)
 [facility] H&P   Patient Demographics:    Kristine Zamora, is a 78 y.o. female  MRN: [id number]   DOB - 1945/12/08  Admit Date - 09/10/2023  Outpatient Primary MD for the patient is Elfredia Nevins, MD  Referring MD/NP/PA: PA Meredeth Ide  Patient coming from: Home  Chief Complaint  Patient presents with   Anemia      HPI:    Kristine Zamora  is a 78 y.o. female, with past medical history of hyperlipidemia, hypertension, diabetes mellitus, left renal malignancy status post ablation by IR , presents to ED secondary to generalized weakness, fatigue, patient went to her primary physician, who directed her to come to ED for further evaluation for anemia, she denies melena, hematochezia, no nausea, no vomiting, no chest pain, no lower extremity edema, no hemoptysis. - In ED hemoglobin was noted to be 7.8, B12 borderline, no iron ferritin, she was ordered to units [facility] and [facility] hospitalist consulted to admit.    Review of systems:      A full 10 point Review of Systems was done, except as stated above, all other Review of Systems were negative.   With Past History of the following :    Past Medical History:  Diagnosis Date   Anemia    Arthritis    HANDS AND FEET   Asthma    Back pain    Diabetes mellitus without complication (HCC)    TYPE 2   Dyspnea    with exersion    GERD (gastroesophageal reflux disease)    Hypertension    Lichen planus atrophicus    PONV (postoperative nausea and vomiting)    Right renal mass       Past Surgical History:  Procedure Laterality Date   BREAST SURGERY  1966   MILK TUBE REMOVED   COLONOSCOPY  2008   Dr. Jena Gauss: pancolonic diverticula, hyperplastic rectosigmoid polyp   COLONOSCOPY N/A 01/16/2015   Procedure: COLONOSCOPY;  Surgeon: Corbin Ade, MD;  Location: [facility];  Service: Endoscopy;  Laterality: N/A;  0900   COLONOSCOPY WITH  PROPOFOL N/A 04/21/2022   Procedure: COLONOSCOPY WITH PROPOFOL;  Surgeon: Corbin Ade, MD;  Location: [facility];  Service: Endoscopy;  Laterality: N/A;  10:30 AM   IR RADIOLOGIST EVAL & MGMT  01/19/2019   IR RADIOLOGIST EVAL & MGMT  03/16/2019   IR RADIOLOGIST EVAL & MGMT  06/15/2019   IR RADIOLOGIST EVAL & MGMT  12/22/2019   IR RADIOLOGIST EVAL & MGMT  12/26/2020   IR RADIOLOGIST EVAL & MGMT  12/26/2021   IR RADIOLOGIST EVAL & MGMT  03/09/2023   POLYPECTOMY  04/21/2022   Procedure: POLYPECTOMY;  Surgeon: Corbin Ade, MD;  Location: [facility];  Service: Endoscopy;;   RADIOLOGY WITH ANESTHESIA Right 02/16/2019   Procedure: CT CRYOABLATION RIGHT RENAL MASS;  Surgeon: Berdine Dance, MD;  Location: [facility];  Service: Anesthesiology;  Laterality: Right;   TONSILLECTOMY  1965      Social History:     Social History   Tobacco Use   Smoking status: Former    Current packs/day: 0.00    Types: Cigarettes    Quit date: 12/21/1994    Years since quitting: 28.7   Smokeless tobacco: Never  Substance Use Topics   Alcohol use: No    Alcohol/week: 0.0 standard drinks of alcohol    Comment: quit drinking about 1996     Family History :     Family History  Problem Relation Age of Onset   Alcohol abuse Mother        old age   Colon cancer Father        diagnosed in his early 82s   Breast cancer Daughter       Home Medications:   Prior to Admission medications   Medication Sig Start Date End Date Taking? Authorizing Provider  acetaminophen (TYLENOL) 500 MG tablet Take 1,000 mg by mouth every 6 (six) hours as needed for mild pain (pain score 1-3).   Yes [provider]  acetaminophen (TYLENOL) 650 MG CR tablet Take 650 mg by mouth every 8 (eight) hours as needed for pain.   Yes [provider]  albuterol (PROVENTIL) 2 MG tablet Take 2 mg by mouth 3 (three) times daily as needed for wheezing or shortness of breath. 09/09/23  Yes [provider]   aspirin EC 81 MG tablet Take 81 mg by mouth in the morning.   Yes [provider]  Cholecalciferol (VITAMIN D3) 50 MCG (2000 UT) TABS Take 2,000 Units by mouth daily.   Yes [provider]  doxycycline (VIBRAMYCIN) 100 MG capsule Take 100 mg by mouth 2 (two) times daily. 09/09/23  Yes [provider]  FEROSUL 325 (65 Fe) MG tablet Take 325 mg by mouth 3 (three) times daily. 08/31/23  Yes [provider]  lisinopril (PRINIVIL,ZESTRIL) 10 MG tablet Take 10 mg by mouth in the morning.   Yes [provider]  metFORMIN (GLUCOPHAGE) 1000 MG tablet Take 1,000 mg by mouth in the morning and at bedtime. 10/11/18  Yes [provider]  methylPREDNISolone (MEDROL DOSEPAK) 4 MG TBPK tablet Take 4 mg by mouth as directed. Dose pack 09/09/23  Yes [provider]  pantoprazole (PROTONIX) 40 MG tablet Take 40 mg by mouth 2 (two) times daily. 02/20/22  Yes [provider]  pravastatin (PRAVACHOL) 20 MG tablet Take 20 mg by mouth at bedtime.    Yes [provider]  SPIRIVA RESPIMAT 2.5 MCG/ACT AERS Inhale 2 puffs into the lungs daily. 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 She thinks it was something they sprayed them with. JUST THE PEELS, thinks it was the insecticide     Physical Exam:   Vitals  Blood pressure (!) 190/75, pulse 87, temperature 98.2 F (36.8 C), temperature source Oral, resp. rate 20, SpO2 100%.   1. General Alert female, laying in bed, no apparent distress  2. Normal affect and insight, Not Suicidal or Homicidal, Awake Alert, Oriented X 3.  3. No F.N deficits, ALL C.Nerves Intact, Strength 5/5 all 4 extremities, Sensation intact all 4 extremities, Plantars down going.  4. Ears and Eyes appear Normal, Conjunctivae clear, PERRLA. Moist Oral Mucosa.  5. Supple Neck,  No JVD, No cervical  lymphadenopathy appriciated, No Carotid Bruits.  6. Symmetrical Chest wall movement, Good air movement bilaterally, CTAB.  7. RRR, No Gallops, Rubs or Murmurs, No Parasternal Heave.  8. Positive Bowel Sounds, Abdomen Soft, No tenderness, No organomegaly appriciated,No rebound -guarding or rigidity.  9.  No Cyanosis, Normal Skin Turgor, No Skin Rash or Bruise.  10. Good muscle tone,  joints appear normal , no effusions, Normal ROM.    Data Review:    CBC Recent Labs  Lab 09/10/23 1310  WBC 7.6  HGB 7.8*  HCT 26.4*  PLT 224  MCV 81.0  MCH 23.9*  MCHC 29.5*  RDW 17.0*  LYMPHSABS 1.2  MONOABS 0.4  EOSABS 0.1  BASOSABS 0.0   ------------------------------------------------------------------------------------------------------------------  Chemistries  Recent Labs  Lab 09/10/23 1310  NA 137  K 3.2*  CL 103  CO2 21*  GLUCOSE 174*  BUN 14  CREATININE 0.82  CALCIUM 9.1  AST 27  ALT 13  ALKPHOS 50  BILITOT 0.4   ------------------------------------------------------------------------------------------------------------------ CrCl cannot be calculated (Unknown ideal weight.). ------------------------------------------------------------------------------------------------------------------ No results for input(s): "TSH", "T4TOTAL", "T3FREE", "THYROIDAB" in the last 72 hours.  Invalid input(s): "FREET3"  Coagulation profile Recent Labs  Lab 09/10/23 1310  INR 1.1   ------------------------------------------------------------------------------------------------------------------- No results for input(s): "DDIMER" in the last 72 hours. -------------------------------------------------------------------------------------------------------------------  Cardiac Enzymes No results for input(s): "CKMB", "TROPONINI", "MYOGLOBIN" in the last 168 hours.  Invalid input(s):  "CK" ------------------------------------------------------------------------------------------------------------------    Component Value Date/Time   BNP 395.0 (H) 09/10/2023 1435     ---------------------------------------------------------------------------------------------------------------  Urinalysis    Component Value Date/Time   COLORURINE STRAW (A) 09/10/2023 1740   APPEARANCEUR CLEAR 09/10/2023 1740   LABSPEC 1.005 09/10/2023 1740   PHURINE 5.0 09/10/2023 1740   GLUCOSEU NEGATIVE 09/10/2023 1740   HGBUR NEGATIVE 09/10/2023 1740   BILIRUBINUR NEGATIVE 09/10/2023 1740   KETONESUR NEGATIVE 09/10/2023 1740   PROTEINUR NEGATIVE 09/10/2023 1740   NITRITE NEGATIVE 09/10/2023 1740   LEUKOCYTESUR NEGATIVE 09/10/2023 1740    ----------------------------------------------------------------------------------------------------------------   Imaging Results:    DG Chest Port 1 View Result Date: 09/10/2023 CLINICAL DATA:  Dyspnea EXAM: PORTABLE CHEST - 1 VIEW COMPARISON:  11/25/2021 FINDINGS: Cardiomediastinal silhouette and pulmonary vasculature are within normal limits. Lungs are clear. IMPRESSION: No acute cardiopulmonary process. Electronically Signed   By: Acquanetta Belling M.D.   On: 09/10/2023 15:30      Assessment & Plan:    Principal Problem:   Symptomatic anemia Active Problems:   GERD (gastroesophageal reflux disease)   Renal cell cancer, right (HCC)   Symptomatic anemia Iron deficiency anemia - Presents with low hemoglobin, 7.8, gradually drifting down over the last few month. - She is Hemoccult negative, denies any history of GI bleed. - Today is negative, there is history of atrial magnets s/p ablation, most recent imaging October 2024 is reassuring, UA is negative as well, but given anemia of iron deficiency I will repeat her CT abdomen pelvis with IV contrast for further evaluation. - Anemia workup significant for low iron and ferritin, and B12 is borderline,  will start on IM supplements as well, may benefit from IV iron.  Will discharge. - Boderline B12, will start on supplements.  Hypertension -Continue with home medications - Pressure elevated, will add as needed hydralazine  Hyperlipidemia - Continue with pravastatin  GERD - Continue with PPI   Diabetes mellitus - Hold metformin, will keep an insulin sliding scale     DVT Prophylaxis  SCDs   AM Labs  Ordered, also please review Full Orders  Family Communication: Admission, patients condition and plan of care including tests being ordered have been discussed with the patient and family at bedside who indicate understanding and agree with the plan and Code Status.  Code Status full code  Likely DC to home  Consults called: None  Admission status: Observation  Time spent in minutes : 70 minutes   Huey Bienenstock M.D on 09/10/2023 at 9:55 PM   [facility] - Office  [phone]

## 2023-09-10 NOTE — Care Management Obs Status (Signed)
 MEDICARE OBSERVATION STATUS NOTIFICATION   Patient Details  Name: Kristine Zamora MRN: [id number] Date of Birth: [birth year]   Medicare Observation Status Notification Given:  Yes    Barron Alvine, RN 09/10/2023, 10:36 PM

## 2023-09-10 NOTE — ED Provider Notes (Addendum)
 Physical Exam  BP (!) 161/50   Pulse 73   Temp 98.4 F (36.9 C) (Oral)   Resp (!) 21   SpO2 97%   Physical Exam Vitals and nursing note reviewed. Exam conducted with a chaperone present.  Constitutional:      General: She is not in acute distress.    Appearance: Normal appearance.  HENT:     Head: Normocephalic and atraumatic.  Eyes:     General:        Right eye: No discharge.        Left eye: No discharge.  Cardiovascular:     Comments: Regular rate and rhythm.  S1/S2 are distinct without any evidence of murmur, rubs, or gallops.  Radial pulses are 2+ bilaterally.  Dorsalis pedis pulses are 2+ bilaterally.  No evidence of pedal edema. Pulmonary:     Comments: Clear to auscultation bilaterally.  Normal effort.  No respiratory distress.  No evidence of wheezes, rales, or rhonchi heard throughout. Abdominal:     General: Abdomen is flat. Bowel sounds are normal. There is no distension.     Tenderness: There is no abdominal tenderness. There is no guarding or rebound.  Genitourinary:    Comments: Rectal exam revealed good tone.  No masses.  Guaiac negative. Musculoskeletal:        General: Normal range of motion.     Cervical back: Neck supple.  Skin:    General: Skin is warm and dry.     Coloration: Skin is pale.     Findings: No rash.  Neurological:     General: No focal deficit present.     Mental Status: She is alert.  Psychiatric:        Mood and Affect: Mood normal.        Behavior: Behavior normal.     Procedures  .Critical Care  Performed by: Teressa Lower, PA-C Authorized by: Teressa Lower, PA-C   Critical care provider statement:    Critical care time (minutes):  35   Critical care time was exclusive of:  Separately billable procedures and treating other patients   Critical care was necessary to treat or prevent imminent or life-threatening deterioration of the following conditions:  Circulatory failure   Critical care was time spent personally  by me on the following activities:  Pulse oximetry, ordering and review of radiographic studies, ordering and review of laboratory studies, ordering and performing treatments and interventions, blood draw for specimens, development of treatment plan with patient or surrogate and discussions with consultants   ED Course / MDM   Clinical Course as of 09/10/23 1735  Thu Sep 10, 2023  1730 CBC with Differential(!) There is evidence of anemia which is lower than her baseline. [CF]  1731 Troponin I (High Sensitivity) Initial and delta troponin are normal. [CF]  1731 Brain natriuretic peptide(!) Slightly elevated. [CF]  1731 Comprehensive metabolic panel(!) Mildly hypokalemic. [CF]  1731 POC occult blood, ED Occult blood negative. [CF]  1731 Protime-INR Normal.  [CF]  1731 DG Chest [facility] Personally ordered and interpreted the study do not see any evidence of pneumonia.  [CF]  1733 I spoke with Dr. Randol Kern [CF]    Clinical Course User Index [CF] Teressa Lower, PA-C   Medical Decision Making Amount and/or Complexity of Data Reviewed Labs: ordered. Decision-making details documented in ED Course. Radiology: ordered. Decision-making details documented in ED Course.  Risk Prescription drug management. Decision regarding hospitalization.   Accepted handoff at shift  change from [employer]. Please see prior provider note for more detail.   Briefly: Patient is 78 y.o. female who presents to the emergency department today for the evaluation of low iron and anemia.  Patient has been noticing some shortness of breath and generalized weakness.  Chart review reveals that the patient was 10.6 back in October and has been on a steady decline up until now.  Sent in by PCP.  DDX: concern for symptomatic anemia  Plan: Transfuse 2 units. Plan for admission for further evaluation. She is stable for admission. Accepted by hospitalist.               Teressa Lower,  PA-C 09/10/23 1736    Honor Loh M, PA-C 09/10/23 1742    Terrilee Files, MD 09/10/23 [phone]

## 2023-09-10 NOTE — ED Notes (Signed)
 Provider present and requests to pause transfusion to collect labs. Phlebotomist now present to draw additional labs.

## 2023-09-11 DIAGNOSIS — D649 Anemia, unspecified: Secondary | ICD-10-CM | POA: Diagnosis not present

## 2023-09-11 LAB — TYPE AND SCREEN
ABO/RH(D): O POS
Antibody Screen: NEGATIVE
Unit division: 0
Unit division: 0

## 2023-09-11 LAB — BASIC METABOLIC PANEL WITH GFR
Anion gap: 7 (ref 5–15)
BUN: 15 mg/dL (ref 8–23)
CO2: 30 mmol/L (ref 22–32)
Calcium: 8.6 mg/dL — ABNORMAL LOW (ref 8.9–10.3)
Chloride: 101 mmol/L (ref 98–111)
Creatinine, Ser: 0.66 mg/dL (ref 0.44–1.00)
GFR, Estimated: >60 mL/min (ref >60)
Glucose, Bld: 97 mg/dL (ref 70–99)
Potassium: 3.1 mmol/L — ABNORMAL LOW (ref 3.5–5.1)
Sodium: 138 mmol/L (ref 135–145)

## 2023-09-11 LAB — CBC
HCT: 31.2 % — ABNORMAL LOW (ref 36.0–46.0)
Hemoglobin: 9.7 g/dL — ABNORMAL LOW (ref 12.0–15.0)
MCH: 24.4 pg — ABNORMAL LOW (ref 26.0–34.0)
MCHC: 31.1 g/dL (ref 30.0–36.0)
MCV: 78.6 fL — ABNORMAL LOW (ref 80.0–100.0)
Platelets: 225 10*3/uL (ref 150–400)
RBC: 3.97 MIL/uL (ref 3.87–5.11)
RDW: 16.9 % — ABNORMAL HIGH (ref 11.5–15.5)
WBC: 7.5 10*3/uL (ref 4.0–10.5)
nRBC: 0 % (ref 0.0–0.2)

## 2023-09-11 LAB — BPAM RBC
Blood Product Expiration Date: 202504102359
Blood Product Expiration Date: 202504302359
ISSUE DATE / TIME: 202504101517
ISSUE DATE / TIME: 202504101923
Unit Type and Rh: 5100
Unit Type and Rh: 9500

## 2023-09-11 LAB — GLUCOSE, CAPILLARY
Glucose-Capillary: 93 mg/dL (ref 70–99)
Glucose-Capillary: 109 mg/dL — ABNORMAL HIGH (ref 70–99)
Glucose-Capillary: 101 mg/dL — ABNORMAL HIGH (ref 70–99)
Glucose-Capillary: 147 mg/dL — ABNORMAL HIGH (ref 70–99)

## 2023-09-11 LAB — HEMOGLOBIN A1C
Hgb A1c MFr Bld: 5.6 % (ref 4.8–5.6)
Mean Plasma Glucose: 114.02 mg/dL

## 2023-09-11 MED ORDER — POTASSIUM CHLORIDE 20 MEQ PO PACK
40.0000 meq | PACK | Freq: Once | ORAL | Status: AC
  Administered 2023-09-11: 40 meq via ORAL
  Filled 2023-09-11: qty 2

## 2023-09-11 MED ORDER — UMECLIDINIUM BROMIDE 62.5 MCG/ACT IN AEPB
1.0000 | INHALATION_SPRAY | Freq: Every day | RESPIRATORY_TRACT | Status: DC
  Administered 2023-09-11 – 2023-09-12 (×2): 1 via RESPIRATORY_TRACT

## 2023-09-11 NOTE — Plan of Care (Signed)

## 2023-09-11 NOTE — Progress Notes (Signed)
  Progress Note   Patient: Kristine Zamora ZOX:[id number] DOB: [birth year] DOA: 09/10/2023     0 DOS: the patient was seen and examined on 09/11/2023    Assessment and Plan: Symptomatic anemia due to iron deficiency anemia  - CT abd/pel pending  - Hgb improved 7.8 -->9.7 - Vit B12 1000 mcg sq daily  - Ferrous sulfate 325 mg PO tid   HTN  - Lisinopril 10 mg PO daily   HLD - Pravachol 20 mg PO daily   GERD - Protonix 40 mg PO bid   DM  - Novolog SS tid  Subjective: Pt seen and examined at the bedside. CT abd/pelvis is pending (that was ordered by the admitting provider). Hgb has uptrended 7.8 -->9.7. Pt denies any bleeding. Continue with iron tablet supplements. Will monitor CBC in the AM and likely discharge tmr pending her Hgb and the results of the CT.  Physical Exam: Vitals:   09/10/23 2200 09/10/23 2230 09/10/23 2316 09/11/23 0331  BP: (!) 183/80 (!) 177/86 (!) 172/62 (!) 143/71  Pulse: 79 74 88 73  Resp: 18 18 20 16   Temp: 98.2 F (36.8 C) 98.2 F (36.8 C) 98.1 F (36.7 C) 99 F (37.2 C)  TempSrc: Oral Oral Oral Oral  SpO2: 100% 99% 98% 97%   Physical Exam HENT:     Head: Normocephalic.     Mouth/Throat:     Mouth: Mucous membranes are moist.  Cardiovascular:     Rate and Rhythm: Normal rate and regular rhythm.  Pulmonary:     Effort: Pulmonary effort is normal.  Abdominal:     Palpations: Abdomen is soft.  Musculoskeletal:        General: Normal range of motion.     Cervical back: Neck supple.  Skin:    General: Skin is warm.  Neurological:     Mental Status: She is alert. Mental status is at baseline.  Psychiatric:        Mood and Affect: Mood normal.       Disposition: Status is: Observation The patient remains OBS appropriate and will d/c before 2 midnights.  Planned Discharge Destination: Home    Time spent: 35 minutes  Author: Baron Hamper , MD 09/11/2023 11:10 AM  For on call review [email or web address].

## 2023-09-11 NOTE — Plan of Care (Signed)
   Problem: Education: Goal: Knowledge of General Education information will improve Description: Including pain rating scale, medication(s)/side effects and non-pharmacologic comfort measures Outcome: Progressing   Problem: Clinical Measurements: Goal: Ability to maintain clinical measurements within normal limits will improve Outcome: Progressing Goal: Diagnostic test results will improve Outcome: Progressing

## 2023-09-12 DIAGNOSIS — D649 Anemia, unspecified: Secondary | ICD-10-CM | POA: Diagnosis not present

## 2023-09-12 LAB — COMPREHENSIVE METABOLIC PANEL WITH GFR
ALT: 15 U/L (ref 0–44)
AST: 23 U/L (ref 15–41)
Albumin: 2.9 g/dL — ABNORMAL LOW (ref 3.5–5.0)
Alkaline Phosphatase: 51 U/L (ref 38–126)
Anion gap: 10 (ref 5–15)
BUN: 15 mg/dL (ref 8–23)
CO2: 29 mmol/L (ref 22–32)
Calcium: 8.7 mg/dL — ABNORMAL LOW (ref 8.9–10.3)
Chloride: 99 mmol/L (ref 98–111)
Creatinine, Ser: 0.76 mg/dL (ref 0.44–1.00)
GFR, Estimated: >60 mL/min (ref >60)
Glucose, Bld: 120 mg/dL — ABNORMAL HIGH (ref 70–99)
Potassium: 4 mmol/L (ref 3.5–5.1)
Sodium: 138 mmol/L (ref 135–145)
Total Bilirubin: 0.7 mg/dL (ref 0.0–1.2)
Total Protein: 5.7 g/dL — ABNORMAL LOW (ref 6.5–8.1)

## 2023-09-12 LAB — MAGNESIUM: Magnesium: 1.5 mg/dL — ABNORMAL LOW (ref 1.7–2.4)

## 2023-09-12 LAB — CBC
HCT: 34.7 % — ABNORMAL LOW (ref 36.0–46.0)
Hemoglobin: 10.5 g/dL — ABNORMAL LOW (ref 12.0–15.0)
MCH: 24.1 pg — ABNORMAL LOW (ref 26.0–34.0)
MCHC: 30.3 g/dL (ref 30.0–36.0)
MCV: 79.6 fL — ABNORMAL LOW (ref 80.0–100.0)
Platelets: 229 10*3/uL (ref 150–400)
RBC: 4.36 MIL/uL (ref 3.87–5.11)
RDW: 17.6 % — ABNORMAL HIGH (ref 11.5–15.5)
WBC: 6.5 10*3/uL (ref 4.0–10.5)
nRBC: 0 % (ref 0.0–0.2)

## 2023-09-12 LAB — GLUCOSE, CAPILLARY: Glucose-Capillary: 112 mg/dL — ABNORMAL HIGH (ref 70–99)

## 2023-09-12 MED ORDER — MAGNESIUM SULFATE 4 GM/100ML IV SOLN
4.0000 g | Freq: Once | INTRAVENOUS | Status: AC
  Administered 2023-09-12: 4 g via INTRAVENOUS
  Filled 2023-09-12: qty 100

## 2023-09-12 NOTE — Discharge Summary (Signed)
 Physician Discharge Summary   Patient: Kristine Zamora MRN: [id number] DOB: [birth year]  Admit date:     09/10/2023  Discharge date: 09/12/23  Discharge Physician: Mickle Albe    PCP: Kathyleen Parkins, MD     Discharge Diagnoses: Principal Problem:   Symptomatic anemia Active Problems:   GERD (gastroesophageal reflux disease)   Renal cell cancer, right (HCC)  Resolved Problems:   * No resolved hospital problems. *  Hospital Course: 78 yo F evaluated for symptomatic anemia due to iron deficiency anemia. ASA discontinued. Iron was low at 16 and ferritin was also low at 4. Pt received Vit B12 1000 mcg sq daily and Ferrous sulfate 325 mg PO tid.  CT abd/pelvis did not show any signs of bleeding.  Hgb improved from 7.8 -->10.5 spontaneously.  Pt and pt's daughter advised to have close follow with the PCP to arrange for outpt GI EGD/colonoscopy. Pt advised me that she declined EGD a few years ago, however, given the recent labile Hgb's it would be prudent to have an updated EGD/colonoscopy. Pt will be discharged 09/12/2023.   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 She thinks it was something they sprayed them with. JUST THE PEELS, thinks it was the insecticide        Medication List     STOP taking these medications    aspirin EC 81 MG tablet   doxycycline 100 MG capsule Commonly known as: VIBRAMYCIN   methylPREDNISolone 4 MG Tbpk tablet Commonly known as: MEDROL DOSEPAK       TAKE these medications    acetaminophen 500 MG tablet Commonly known as: TYLENOL Take 1,000 mg by mouth every 6 (six) hours as needed for mild pain (pain score 1-3). What changed: Another medication with the same name was removed. Continue taking this medication, and follow the directions you see here.   albuterol 2 MG tablet Commonly known as: PROVENTIL Take 2 mg by mouth 3 (three) times daily as  needed for wheezing or shortness of breath.   FeroSul 325 (65 Fe) MG tablet Generic drug: ferrous sulfate Take 325 mg by mouth 3 (three) times daily.   lisinopril 10 MG tablet Commonly known as: ZESTRIL Take 10 mg by mouth in the morning.   metFORMIN 1000 MG tablet Commonly known as: GLUCOPHAGE Take 1,000 mg by mouth in the morning and at bedtime.   pantoprazole 40 MG tablet Commonly known as: PROTONIX Take 40 mg by mouth 2 (two) times daily.   pravastatin 20 MG tablet Commonly known as: PRAVACHOL Take 20 mg by mouth at bedtime.   Spiriva Respimat 2.5 MCG/ACT Aers Generic drug: Tiotropium Bromide Monohydrate Inhale 2 puffs into the lungs daily.   Vitamin D3 50 MCG (2000 UT) Tabs Take 2,000 Units by mouth daily.        Discharge Exam: Physical Exam HENT:     Head: Normocephalic.     Mouth/Throat:     Mouth: Mucous membranes are moist.  Cardiovascular:     Rate and Rhythm: Normal rate and regular rhythm.  Pulmonary:     Effort: Pulmonary effort is normal.  Abdominal:     Palpations: Abdomen is soft.  Musculoskeletal:        General: Normal range of motion.     Cervical back: Neck supple.  Skin:    General: Skin is warm.  Neurological:     Mental Status:  She is alert. Mental status is at baseline.  Psychiatric:        Mood and Affect: Mood normal.      Condition at discharge: fair  The results of significant diagnostics from this hospitalization (including imaging, microbiology, ancillary and laboratory) are listed below for reference.   Imaging Studies: CT ABDOMEN PELVIS W CONTRAST Result Date: 09/11/2023 CLINICAL DATA:  Renal cell carcinoma status post percutaneous ablation. Iron deficiency anemia. EXAM: CT ABDOMEN AND PELVIS WITH CONTRAST TECHNIQUE: Multidetector CT imaging of the abdomen and pelvis was performed using the standard protocol following bolus administration of intravenous contrast. RADIATION DOSE REDUCTION: This exam was performed  according to the departmental dose-optimization program which includes automated exposure control, adjustment of the mA and/or kV according to patient size and/or use of iterative reconstruction technique. CONTRAST:  OMNIPAQUE IOHEXOL 300 MG/ML  SOLN COMPARISON:  02/25/2023 FINDINGS: Lower chest: No acute abnormality. Hepatobiliary: Stable cysts within the left hepatic lobe. Liver otherwise unremarkable. No intra or extrahepatic biliary ductal dilation. Gallbladder unremarkable. Pancreas: Unremarkable Spleen: Unremarkable Adrenals/Urinary Tract: Stable post ablated changes within the upper pole of the right kidney. 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 The kidneys are otherwise unremarkable. Adrenal glands are unremarkable. Bladder is unremarkable. Stomach/Bowel: Moderate descending and sigmoid diverticulosis. Stomach, small bowel, and large bowel are otherwise unremarkable. Appendix normal. No evidence of obstruction or focal inflammation. No free intraperitoneal gas or fluid. Vascular/Lymphatic: Aortic atherosclerosis. No enlarged abdominal or pelvic lymph nodes. Reproductive: Uterus and bilateral adnexa are unremarkable. Other: No abdominal wall hernia or abnormality. No abdominopelvic ascites. Musculoskeletal: No acute or significant osseous findings. IMPRESSION: 1. Stable post ablated changes within the upper pole of the right kidney. No definite evidence of recurrent or residual disease within the abdomen and pelvis on this limited, single-phase examination. 2. Moderate descending and sigmoid diverticulosis without evidence of acute diverticulitis. 3. Aortic atherosclerosis. Electronically Signed   By: Worthy Heads M.D.   On: 09/11/2023 19:54   DG Chest Port 1 View Result Date: 09/10/2023 CLINICAL DATA:  Dyspnea EXAM: PORTABLE CHEST - 1 VIEW COMPARISON:  11/25/2021 FINDINGS: Cardiomediastinal silhouette and pulmonary vasculature are within normal limits. Lungs are  clear. IMPRESSION: No acute cardiopulmonary process. Electronically Signed   By: Elester Grim M.D.   On: 09/10/2023 15:30    Microbiology: Results for orders placed or performed during the hospital encounter of 02/12/19  Novel Coronavirus, NAA (Hosp order, Send-out to Ref Lab; TAT 18-24 hrs     Status: None   Collection Time: 02/12/19 12:38 PM   Specimen: Nasopharyngeal Swab; Respiratory  Result Value Ref Range Status   SARS-CoV-2, NAA NOT DETECTED NOT DETECTED Final    Comment: (NOTE) This nucleic acid amplification test was developed and its performance characteristics determined by [employer]. Nucleic acid amplification tests include PCR and TMA. This test has not been FDA cleared or approved. This test has been authorized by FDA under an Emergency Use Authorization (EUA). This test is only authorized for the duration of time the declaration that circumstances exist justifying the authorization of the emergency use of in vitro diagnostic tests for detection of SARS-CoV-2 virus and/or diagnosis of COVID-19 infection under section 564(b)(1) of the Act, 21 U.S.C. 161WRU-0(A) (1), unless the authorization is terminated or revoked sooner. When diagnostic testing is negative, the possibility of a false negative result should be considered in the context of a patient's recent exposures and the presence of clinical signs and symptoms consistent with COVID-19.  An individual without symptoms of COVID- 19 and who is not shedding SARS-CoV-2 vi rus would expect to have a negative (not detected) result in this assay. Performed At: [facility] [address] [place], [place] [id number] Pearlean Botts MD WU:[id number]    Coronavirus Source NASOPHARYNGEAL  Final    Comment: Performed at [facility], [address] [place], [place] [address]    Labs: CBC: Recent Labs  Lab 09/10/23 1310 09/11/23 0401 09/12/23 0528  WBC 7.6 7.5 6.5  NEUTROABS 5.9  --   --   HGB  7.8* 9.7* 10.5*  HCT 26.4* 31.2* 34.7*  MCV 81.0 78.6* 79.6*  PLT 224 225 229   Basic Metabolic Panel: Recent Labs  Lab 09/10/23 1310 09/11/23 0401 09/12/23 0528  NA 137 138 138  K 3.2* 3.1* 4.0  CL 103 101 99  CO2 21* 30 29  GLUCOSE 174* 97 120*  BUN 14 15 15   CREATININE 0.82 0.66 0.76  CALCIUM 9.1 8.6* 8.7*  MG  --   --  1.5*   Liver Function Tests: Recent Labs  Lab 09/10/23 1310 09/12/23 0528  AST 27 23  ALT 13 15  ALKPHOS 50 51  BILITOT 0.4 0.7  PROT 5.9* 5.7*  ALBUMIN 3.3* 2.9*   CBG: Recent Labs  Lab 09/11/23 0735 09/11/23 1106 09/11/23 1631 09/11/23 2031 09/12/23 0716  GLUCAP 93 109* 101* 147* 112*    Discharge time spent: greater than 30 minutes.  Signed: Hutchinson Isenberg , MD [facility] 09/12/2023

## 2023-09-12 NOTE — Plan of Care (Signed)

## 2023-09-16 DIAGNOSIS — D509 Iron deficiency anemia, unspecified: Secondary | ICD-10-CM | POA: Diagnosis not present

## 2023-09-16 DIAGNOSIS — E538 Deficiency of other specified B group vitamins: Secondary | ICD-10-CM | POA: Diagnosis not present

## 2023-09-16 DIAGNOSIS — D518 Other vitamin B12 deficiency anemias: Secondary | ICD-10-CM | POA: Diagnosis not present

## 2023-09-16 DIAGNOSIS — I10 Essential (primary) hypertension: Secondary | ICD-10-CM | POA: Diagnosis not present

## 2023-09-16 DIAGNOSIS — E876 Hypokalemia: Secondary | ICD-10-CM | POA: Diagnosis not present

## 2023-09-28 NOTE — Progress Notes (Unsigned)
 GI Office Note    Referring Provider: Kathyleen Parkins, MD Primary Care Physician:  Kathyleen Parkins, MD  Primary Gastroenterologist: Rheba Cedar, MD   Chief Complaint   No chief complaint on file.   History of Present Illness   Kristine Zamora is a 78 y.o. female presenting today for follow up. H/o mulitple colonic adenomas, tendency towards loose stools (improvedon benefiber), GERD.   Presents for hospital follow up for symptomatic anemia due to IDA. She was not seen by GI while inpatient.   In ED, Hgb 7.8. Hgb 11.6 in 04/2022. B12 291,  folate 10, iron 16, fe sat 4, tibc 439, ferritin 4. stool heme negative.  Received two units of blood, Hgb 10.5 at discharge.   CT A/P with contrast 09/2023: IMPRESSION: 1. Stable post ablated changes within the upper pole of the right kidney. No definite evidence of recurrent or residual disease within the abdomen and pelvis on this limited, single-phase examination. 2. Moderate descending and sigmoid diverticulosis without evidence of acute diverticulitis. 3. Aortic atherosclerosis.  Colonoscopy 04/2022: -diverticulosis -four 3-37mm polyp removed from mid descending colon and cecum -three 6-49mm polyps removed from mid descending colon and proximal ascendinc colon -one 9mm polyp in mid rectum removed -nonbleeding internal hemorrhoids -mulitiple tubular adenomas, next colonoscopy in 3 yera  Medications   Current Outpatient Medications  Medication Sig Dispense Refill   acetaminophen  (TYLENOL ) 500 MG tablet Take 1,000 mg by mouth every 6 (six) hours as needed for mild pain (pain score 1-3).     albuterol  (PROVENTIL ) 2 MG tablet Take 2 mg by mouth 3 (three) times daily as needed for wheezing or shortness of breath.     Cholecalciferol (VITAMIN D3) 50 MCG (2000 UT) TABS Take 2,000 Units by mouth daily.     FEROSUL 325 (65 Fe) MG tablet Take 325 mg by mouth 3 (three) times daily.     lisinopril  (PRINIVIL ,ZESTRIL ) 10 MG tablet Take 10 mg  by mouth in the morning.     metFORMIN  (GLUCOPHAGE ) 1000 MG tablet Take 1,000 mg by mouth in the morning and at bedtime.     pantoprazole  (PROTONIX ) 40 MG tablet Take 40 mg by mouth 2 (two) times daily.     pravastatin  (PRAVACHOL ) 20 MG tablet Take 20 mg by mouth at bedtime.      SPIRIVA  RESPIMAT 2.5 MCG/ACT AERS Inhale 2 puffs into the lungs daily.     No current facility-administered medications for this visit.    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 Riley Cheadle: pancolonic diverticula, hyperplastic rectosigmoid polyp   COLONOSCOPY N/A 01/16/2015   Procedure: COLONOSCOPY;  Surgeon: Suzette Espy, MD;  Location: [facility];  Service:  Endoscopy;  Laterality: N/A;  0900   COLONOSCOPY WITH PROPOFOL  N/A 04/21/2022   Procedure: COLONOSCOPY WITH PROPOFOL ;  Surgeon: Suzette Espy, MD;  Location: [facility];  Service: Endoscopy;  Laterality: N/A;  10:30 AM   IR RADIOLOGIST EVAL & MGMT  01/19/2019   IR RADIOLOGIST EVAL & MGMT  03/16/2019   IR RADIOLOGIST EVAL & MGMT  06/15/2019   IR RADIOLOGIST EVAL & MGMT  12/22/2019   IR RADIOLOGIST EVAL & MGMT  12/26/2020   IR RADIOLOGIST EVAL & MGMT  12/26/2021   IR RADIOLOGIST EVAL & MGMT  03/09/2023   POLYPECTOMY  04/21/2022   Procedure: POLYPECTOMY;  Surgeon: Suzette Espy, MD;  Location: [facility];  Service: Endoscopy;;    RADIOLOGY WITH ANESTHESIA Right 02/16/2019   Procedure: CT CRYOABLATION RIGHT RENAL MASS;  Surgeon: Lucinda Saber, MD;  Location: [facility];  Service: Anesthesiology;  Laterality: Right;   TONSILLECTOMY  1965    Past Family History   Family History  Problem Relation Age of Onset   Alcohol abuse Mother        old age   Colon cancer Father        diagnosed in his early 42s   Breast cancer Daughter     Past Social History   Social History   Socioeconomic History   Marital status: Married    Spouse name: Not on file   Number of children: Not on file   Years of education: Not on file   Highest education level: Not on file  Occupational History   Not on file  Tobacco Use   Smoking status: Former    Current packs/day: 0.00    Types: Cigarettes    Quit date: 12/21/1994    Years since quitting: 28.7   Smokeless tobacco: Never  Vaping Use   Vaping status: Never Used  Substance and Sexual Activity   Alcohol use: No    Alcohol/week: 0.0 standard drinks of alcohol    Comment: quit drinking about 1996   Drug use: No   Sexual activity: Not Currently  Other Topics Concern   Not on file  Social History Narrative   Not on file   Social Drivers of Health   Financial Resource Strain: Not on file  Food Insecurity: No Food Insecurity (09/10/2023)   Hunger Vital Sign    Worried About Running Out of Food in the Last Year: Never true    Ran Out of Food in the Last Year: Never true  Transportation Needs: No Transportation Needs (09/10/2023)   PRAPARE - Administrator, Civil Service (Medical): No    Lack of Transportation (Non-Medical): No  Physical Activity: Not on file  Stress: Not on file  Social Connections: Socially Isolated (09/10/2023)   Social Connection and Isolation Panel [NHANES]    Frequency of Communication with Friends and Family: More than three times a week    Frequency of Social Gatherings with Friends and Family: Never    Attends Religious Services: Never     Database administrator or Organizations: No    Attends Banker Meetings: Never    Marital Status: Widowed  Intimate Partner Violence: Not At Risk (09/10/2023)   Humiliation, Afraid, Rape, and Kick questionnaire    Fear of Current or Ex-Partner: No    Emotionally Abused: No    Physically Abused: No    Sexually Abused: No    Review of Systems   General: Negative for anorexia, weight loss,  fever, chills, fatigue, weakness. ENT: Negative for hoarseness, difficulty swallowing , nasal congestion. CV: Negative for chest pain, angina, palpitations, dyspnea on exertion, peripheral edema.  Respiratory: Negative for dyspnea at rest, dyspnea on exertion, cough, sputum, wheezing.  GI: See history of present illness. GU:  Negative for dysuria, hematuria, urinary incontinence, urinary frequency, nocturnal urination.  Endo: Negative for unusual weight change.     Physical Exam   There were no vitals taken for this visit.   General: Well-nourished, well-developed in no acute distress.  Eyes: No icterus. Mouth: Oropharyngeal mucosa moist and pink , no lesions erythema or exudate. Lungs: Clear to auscultation bilaterally.  Heart: Regular rate and rhythm, no murmurs rubs or gallops.  Abdomen: Bowel sounds are normal, nontender, nondistended, no hepatosplenomegaly or masses,  no abdominal bruits or hernia , no rebound or guarding.  Rectal: ***  Extremities: No lower extremity edema. No clubbing or deformities. Neuro: Alert and oriented x 4   Skin: Warm and dry, no jaundice.   Psych: Alert and cooperative, normal mood and affect.  Labs   *** Imaging Studies   CT ABDOMEN PELVIS W CONTRAST Result Date: 09/11/2023 CLINICAL DATA:  Renal cell carcinoma status post percutaneous ablation. Iron deficiency anemia. EXAM: CT ABDOMEN AND PELVIS WITH CONTRAST TECHNIQUE: Multidetector CT imaging of the abdomen and pelvis was performed using the standard protocol following bolus administration  of intravenous contrast. RADIATION DOSE REDUCTION: This exam was performed according to the departmental dose-optimization program which includes automated exposure control, adjustment of the mA and/or kV according to patient size and/or use of iterative reconstruction technique. CONTRAST:  OMNIPAQUE  IOHEXOL  300 MG/ML  SOLN COMPARISON:  02/25/2023 FINDINGS: Lower chest: No acute abnormality. Hepatobiliary: Stable cysts within the left hepatic lobe. Liver otherwise unremarkable. No intra or extrahepatic biliary ductal dilation. Gallbladder unremarkable. Pancreas: Unremarkable Spleen: Unremarkable Adrenals/Urinary Tract: Stable post ablated changes within the upper pole of the right kidney. 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 The kidneys are otherwise unremarkable. Adrenal glands are unremarkable. Bladder is unremarkable. Stomach/Bowel: Moderate descending and sigmoid diverticulosis. Stomach, small bowel, and large bowel are otherwise unremarkable. Appendix normal. No evidence of obstruction or focal inflammation. No free intraperitoneal gas or fluid. Vascular/Lymphatic: Aortic atherosclerosis. No enlarged abdominal or pelvic lymph nodes. Reproductive: Uterus and bilateral adnexa are unremarkable. Other: No abdominal wall hernia or abnormality. No abdominopelvic ascites. Musculoskeletal: No acute or significant osseous findings. IMPRESSION: 1. Stable post ablated changes within the upper pole of the right kidney. No definite evidence of recurrent or residual disease within the abdomen and pelvis on this limited, single-phase examination. 2. Moderate descending and sigmoid diverticulosis without evidence of acute diverticulitis. 3. Aortic atherosclerosis. Electronically Signed   By: Worthy Heads M.D.   On: 09/11/2023 19:54   DG Chest Port 1 View Result Date: 09/10/2023 CLINICAL DATA:  Dyspnea EXAM: PORTABLE CHEST - 1 VIEW COMPARISON:  11/25/2021 FINDINGS:  Cardiomediastinal silhouette and pulmonary vasculature are within normal limits. Lungs are clear. IMPRESSION: No acute cardiopulmonary process. Electronically Signed   By: Elester Grim M.D.   On: 09/10/2023 15:30    Assessment       PLAN   ***   Trudie Fuse. Harles Lied, MHS, PA-C [facility]

## 2023-09-28 NOTE — H&P (View-Only) (Signed)
 GI Office Note    Referring Provider: Kathyleen Parkins, MD Primary Care Physician:  Kathyleen Parkins, MD  Primary Gastroenterologist: Rheba Cedar, MD   Chief Complaint   No chief complaint on file.   History of Present Illness   Kristine Zamora is a 78 y.o. female presenting today for follow up. H/o mulitple colonic adenomas, tendency towards loose stools (improvedon benefiber), GERD.   Presents for hospital follow up for symptomatic anemia due to IDA. She was not seen by GI while inpatient.   In ED, Hgb 7.8. Hgb 11.6 in 04/2022. B12 291,  folate 10, iron 16, fe sat 4, tibc 439, ferritin 4. stool heme negative.  Received two units of blood, Hgb 10.5 at discharge.   CT A/P with contrast 09/2023: IMPRESSION: 1. Stable post ablated changes within the upper pole of the right kidney. No definite evidence of recurrent or residual disease within the abdomen and pelvis on this limited, single-phase examination. 2. Moderate descending and sigmoid diverticulosis without evidence of acute diverticulitis. 3. Aortic atherosclerosis.  Colonoscopy 04/2022: -diverticulosis -four 3-37mm polyp removed from mid descending colon and cecum -three 6-49mm polyps removed from mid descending colon and proximal ascendinc colon -one 9mm polyp in mid rectum removed -nonbleeding internal hemorrhoids -mulitiple tubular adenomas, next colonoscopy in 3 yera  Medications   Current Outpatient Medications  Medication Sig Dispense Refill   acetaminophen  (TYLENOL ) 500 MG tablet Take 1,000 mg by mouth every 6 (six) hours as needed for mild pain (pain score 1-3).     albuterol  (PROVENTIL ) 2 MG tablet Take 2 mg by mouth 3 (three) times daily as needed for wheezing or shortness of breath.     Cholecalciferol (VITAMIN D3) 50 MCG (2000 UT) TABS Take 2,000 Units by mouth daily.     FEROSUL 325 (65 Fe) MG tablet Take 325 mg by mouth 3 (three) times daily.     lisinopril  (PRINIVIL ,ZESTRIL ) 10 MG tablet Take 10 mg  by mouth in the morning.     metFORMIN  (GLUCOPHAGE ) 1000 MG tablet Take 1,000 mg by mouth in the morning and at bedtime.     pantoprazole  (PROTONIX ) 40 MG tablet Take 40 mg by mouth 2 (two) times daily.     pravastatin  (PRAVACHOL ) 20 MG tablet Take 20 mg by mouth at bedtime.      SPIRIVA  RESPIMAT 2.5 MCG/ACT AERS Inhale 2 puffs into the lungs daily.     No current facility-administered medications for this visit.    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 Riley Cheadle: pancolonic diverticula, hyperplastic rectosigmoid polyp   COLONOSCOPY N/A 01/16/2015   Procedure: COLONOSCOPY;  Surgeon: Suzette Espy, MD;  [facility]: [facility];  Service:  Endoscopy;  Laterality: N/A;  0900   COLONOSCOPY WITH PROPOFOL  N/A 04/21/2022   Procedure: COLONOSCOPY WITH PROPOFOL ;  Surgeon: Suzette Espy, MD;  Location: [facility];  Service: Endoscopy;  Laterality: N/A;  10:30 AM   IR RADIOLOGIST EVAL & MGMT  01/19/2019   IR RADIOLOGIST EVAL & MGMT  03/16/2019   IR RADIOLOGIST EVAL & MGMT  06/15/2019   IR RADIOLOGIST EVAL & MGMT  12/22/2019   IR RADIOLOGIST EVAL & MGMT  12/26/2020   IR RADIOLOGIST EVAL & MGMT  12/26/2021   IR RADIOLOGIST EVAL & MGMT  03/09/2023   POLYPECTOMY  04/21/2022   Procedure: POLYPECTOMY;  Surgeon: Suzette Espy, MD;  Location: [facility];  Service: Endoscopy;;    RADIOLOGY WITH ANESTHESIA Right 02/16/2019   Procedure: CT CRYOABLATION RIGHT RENAL MASS;  Surgeon: Lucinda Saber, MD;  Location: [facility];  Service: Anesthesiology;  Laterality: Right;   TONSILLECTOMY  1965    Past Family History   Family History  Problem Relation Age of Onset   Alcohol abuse Mother        old age   Colon cancer Father        diagnosed in his early 42s   Breast cancer Daughter     Past Social History   Social History   Socioeconomic History   Marital status: Married    Spouse name: Not on file   Number of children: Not on file   Years of education: Not on file   Highest education level: Not on file  Occupational History   Not on file  Tobacco Use   Smoking status: Former    Current packs/day: 0.00    Types: Cigarettes    Quit date: 12/21/1994    Years since quitting: 28.7   Smokeless tobacco: Never  Vaping Use   Vaping status: Never Used  Substance and Sexual Activity   Alcohol use: No    Alcohol/week: 0.0 standard drinks of alcohol    Comment: quit drinking about 1996   Drug use: No   Sexual activity: Not Currently  Other Topics Concern   Not on file  Social History Narrative   Not on file   Social Drivers of Health   Financial Resource Strain: Not on file  Food Insecurity: No Food Insecurity (09/10/2023)   Hunger Vital Sign    Worried About Running Out of Food in the Last Year: Never true    Ran Out of Food in the Last Year: Never true  Transportation Needs: No Transportation Needs (09/10/2023)   PRAPARE - Administrator, Civil Service (Medical): No    Lack of Transportation (Non-Medical): No  Physical Activity: Not on file  Stress: Not on file  Social Connections: Socially Isolated (09/10/2023)   Social Connection and Isolation Panel [NHANES]    Frequency of Communication with Friends and Family: More than three times a week    Frequency of Social Gatherings with Friends and Family: Never    Attends Religious Services: Never     Database administrator or Organizations: No    Attends Banker Meetings: Never    Marital Status: Widowed  Intimate Partner Violence: Not At Risk (09/10/2023)   Humiliation, Afraid, Rape, and Kick questionnaire    Fear of Current or Ex-Partner: No    Emotionally Abused: No    Physically Abused: No    Sexually Abused: No    Review of Systems   General: Negative for anorexia, weight loss,  fever, chills, fatigue, weakness. ENT: Negative for hoarseness, difficulty swallowing , nasal congestion. CV: Negative for chest pain, angina, palpitations, dyspnea on exertion, peripheral edema.  Respiratory: Negative for dyspnea at rest, dyspnea on exertion, cough, sputum, wheezing.  GI: See history of present illness. GU:  Negative for dysuria, hematuria, urinary incontinence, urinary frequency, nocturnal urination.  Endo: Negative for unusual weight change.     Physical Exam   There were no vitals taken for this visit.   General: Well-nourished, well-developed in no acute distress.  Eyes: No icterus. Mouth: Oropharyngeal mucosa moist and pink , no lesions erythema or exudate. Lungs: Clear to auscultation bilaterally.  Heart: Regular rate and rhythm, no murmurs rubs or gallops.  Abdomen: Bowel sounds are normal, nontender, nondistended, no hepatosplenomegaly or masses,  no abdominal bruits or hernia , no rebound or guarding.  Rectal: ***  Extremities: No lower extremity edema. No clubbing or deformities. Neuro: Alert and oriented x 4   Skin: Warm and dry, no jaundice.   Psych: Alert and cooperative, normal mood and affect.  Labs   *** Imaging Studies   CT ABDOMEN PELVIS W CONTRAST Result Date: 09/11/2023 CLINICAL DATA:  Renal cell carcinoma status post percutaneous ablation. Iron deficiency anemia. EXAM: CT ABDOMEN AND PELVIS WITH CONTRAST TECHNIQUE: Multidetector CT imaging of the abdomen and pelvis was performed using the standard protocol following bolus administration  of intravenous contrast. RADIATION DOSE REDUCTION: This exam was performed according to the departmental dose-optimization program which includes automated exposure control, adjustment of the mA and/or kV according to patient size and/or use of iterative reconstruction technique. CONTRAST:  OMNIPAQUE  IOHEXOL  300 MG/ML  SOLN COMPARISON:  02/25/2023 FINDINGS: Lower chest: No acute abnormality. Hepatobiliary: Stable cysts within the left hepatic lobe. Liver otherwise unremarkable. No intra or extrahepatic biliary ductal dilation. Gallbladder unremarkable. Pancreas: Unremarkable Spleen: Unremarkable Adrenals/Urinary Tract: Stable post ablated changes within the upper pole of the right kidney. 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 The kidneys are otherwise unremarkable. Adrenal glands are unremarkable. Bladder is unremarkable. Stomach/Bowel: Moderate descending and sigmoid diverticulosis. Stomach, small bowel, and large bowel are otherwise unremarkable. Appendix normal. No evidence of obstruction or focal inflammation. No free intraperitoneal gas or fluid. Vascular/Lymphatic: Aortic atherosclerosis. No enlarged abdominal or pelvic lymph nodes. Reproductive: Uterus and bilateral adnexa are unremarkable. Other: No abdominal wall hernia or abnormality. No abdominopelvic ascites. Musculoskeletal: No acute or significant osseous findings. IMPRESSION: 1. Stable post ablated changes within the upper pole of the right kidney. No definite evidence of recurrent or residual disease within the abdomen and pelvis on this limited, single-phase examination. 2. Moderate descending and sigmoid diverticulosis without evidence of acute diverticulitis. 3. Aortic atherosclerosis. Electronically Signed   By: Worthy Heads M.D.   On: 09/11/2023 19:54   DG Chest Port 1 View Result Date: 09/10/2023 CLINICAL DATA:  Dyspnea EXAM: PORTABLE CHEST - 1 VIEW COMPARISON:  11/25/2021 FINDINGS:  Cardiomediastinal silhouette and pulmonary vasculature are within normal limits. Lungs are clear. IMPRESSION: No acute cardiopulmonary process. Electronically Signed   By: Elester Grim M.D.   On: 09/10/2023 15:30    Assessment       PLAN   ***   Trudie Fuse. Harles Lied, MHS, PA-C [facility]

## 2023-09-29 ENCOUNTER — Telehealth: Payer: Self-pay | Admitting: *Deleted

## 2023-09-29 ENCOUNTER — Encounter: Payer: Self-pay | Admitting: Gastroenterology

## 2023-09-29 ENCOUNTER — Encounter: Payer: Self-pay | Admitting: *Deleted

## 2023-09-29 ENCOUNTER — Ambulatory Visit (INDEPENDENT_AMBULATORY_CARE_PROVIDER_SITE_OTHER): Admitting: Gastroenterology

## 2023-09-29 VITALS — BP 166/82 | HR 77 | Temp 98.6°F | Ht 62.0 in | Wt 201.4 lb

## 2023-09-29 DIAGNOSIS — Z860100 Personal history of colon polyps, unspecified: Secondary | ICD-10-CM

## 2023-09-29 DIAGNOSIS — D509 Iron deficiency anemia, unspecified: Secondary | ICD-10-CM | POA: Insufficient documentation

## 2023-09-29 DIAGNOSIS — Z860101 Personal history of adenomatous and serrated colon polyps: Secondary | ICD-10-CM | POA: Diagnosis not present

## 2023-09-29 DIAGNOSIS — K219 Gastro-esophageal reflux disease without esophagitis: Secondary | ICD-10-CM

## 2023-09-29 DIAGNOSIS — I499 Cardiac arrhythmia, unspecified: Secondary | ICD-10-CM | POA: Diagnosis not present

## 2023-09-29 DIAGNOSIS — R001 Bradycardia, unspecified: Secondary | ICD-10-CM | POA: Diagnosis not present

## 2023-09-29 NOTE — Telephone Encounter (Signed)
 Notification or Prior Authorization is not required for the requested services You are not required to submit a notification/prior authorization based on the information provided. If you have general questions about the prior authorization requirements, visit [email or web address] > Clinician Resources > Advance and Admission Notification Requirements. The number above acknowledges your notification. Please write this reference number down for future reference. If you would like to request an organization determination, please call us  at [phone]. Decision ID #: [id number]

## 2023-09-29 NOTE — Patient Instructions (Signed)
 We will be in touch to schedule procedures for iron deficiency anemia once discuss plan with Dr. Riley Cheadle.

## 2023-09-30 ENCOUNTER — Encounter: Payer: Self-pay | Admitting: *Deleted

## 2023-10-01 DIAGNOSIS — E538 Deficiency of other specified B group vitamins: Secondary | ICD-10-CM | POA: Diagnosis not present

## 2023-10-02 DIAGNOSIS — I10 Essential (primary) hypertension: Secondary | ICD-10-CM | POA: Diagnosis not present

## 2023-10-02 DIAGNOSIS — L255 Unspecified contact dermatitis due to plants, except food: Secondary | ICD-10-CM | POA: Diagnosis not present

## 2023-10-13 ENCOUNTER — Telehealth: Payer: Self-pay | Admitting: *Deleted

## 2023-10-13 NOTE — Telephone Encounter (Signed)
 Called pt. Rescheduled todays procedure for Thursday 12:30pm. Pt did not want to the oral prep again. She wanted the pill prep. Advised will leave sample/instructions for pick up.

## 2023-10-15 ENCOUNTER — Telehealth: Payer: Self-pay | Admitting: *Deleted

## 2023-10-15 ENCOUNTER — Observation Stay (HOSPITAL_COMMUNITY)
Admission: RE | Admit: 2023-10-15 | Discharge: 2023-10-16 | Disposition: A | Discharge status: Home / Self Care | Attending: Internal Medicine | Admitting: Internal Medicine

## 2023-10-15 ENCOUNTER — Ambulatory Visit (HOSPITAL_COMMUNITY): Admitting: Anesthesiology

## 2023-10-15 ENCOUNTER — Other Ambulatory Visit: Payer: Self-pay

## 2023-10-15 ENCOUNTER — Encounter (HOSPITAL_COMMUNITY)
Admission: RE | Disposition: A | Discharge status: Home / Self Care | Payer: Self-pay | Source: Home / Self Care | Attending: Emergency Medicine

## 2023-10-15 ENCOUNTER — Encounter (HOSPITAL_COMMUNITY): Payer: Self-pay | Admitting: Internal Medicine

## 2023-10-15 ENCOUNTER — Ambulatory Visit

## 2023-10-15 DIAGNOSIS — Z87891 Personal history of nicotine dependence: Secondary | ICD-10-CM | POA: Insufficient documentation

## 2023-10-15 DIAGNOSIS — J45909 Unspecified asthma, uncomplicated: Secondary | ICD-10-CM | POA: Insufficient documentation

## 2023-10-15 DIAGNOSIS — K5730 Diverticulosis of large intestine without perforation or abscess without bleeding: Secondary | ICD-10-CM | POA: Insufficient documentation

## 2023-10-15 DIAGNOSIS — I499 Cardiac arrhythmia, unspecified: Secondary | ICD-10-CM | POA: Diagnosis not present

## 2023-10-15 DIAGNOSIS — D124 Benign neoplasm of descending colon: Secondary | ICD-10-CM | POA: Diagnosis not present

## 2023-10-15 DIAGNOSIS — K640 First degree hemorrhoids: Secondary | ICD-10-CM | POA: Diagnosis not present

## 2023-10-15 DIAGNOSIS — Z85528 Personal history of other malignant neoplasm of kidney: Secondary | ICD-10-CM | POA: Insufficient documentation

## 2023-10-15 DIAGNOSIS — D508 Other iron deficiency anemias: Secondary | ICD-10-CM

## 2023-10-15 DIAGNOSIS — Z7984 Long term (current) use of oral hypoglycemic drugs: Secondary | ICD-10-CM | POA: Insufficient documentation

## 2023-10-15 DIAGNOSIS — E782 Mixed hyperlipidemia: Secondary | ICD-10-CM | POA: Diagnosis not present

## 2023-10-15 DIAGNOSIS — D509 Iron deficiency anemia, unspecified: Secondary | ICD-10-CM | POA: Diagnosis not present

## 2023-10-15 DIAGNOSIS — E119 Type 2 diabetes mellitus without complications: Secondary | ICD-10-CM | POA: Insufficient documentation

## 2023-10-15 DIAGNOSIS — E876 Hypokalemia: Secondary | ICD-10-CM | POA: Diagnosis not present

## 2023-10-15 DIAGNOSIS — I10 Essential (primary) hypertension: Secondary | ICD-10-CM | POA: Diagnosis not present

## 2023-10-15 DIAGNOSIS — K219 Gastro-esophageal reflux disease without esophagitis: Secondary | ICD-10-CM | POA: Insufficient documentation

## 2023-10-15 DIAGNOSIS — Z860109 Personal history of other colon polyps: Secondary | ICD-10-CM | POA: Diagnosis not present

## 2023-10-15 DIAGNOSIS — R001 Bradycardia, unspecified: Secondary | ICD-10-CM

## 2023-10-15 DIAGNOSIS — I498 Other specified cardiac arrhythmias: Principal | ICD-10-CM

## 2023-10-15 DIAGNOSIS — Z79899 Other long term (current) drug therapy: Secondary | ICD-10-CM | POA: Diagnosis not present

## 2023-10-15 HISTORY — DX: Pure hypercholesterolemia, unspecified: E78.00

## 2023-10-15 LAB — BASIC METABOLIC PANEL WITH GFR
Anion gap: 15 (ref 5–15)
BUN: 15 mg/dL (ref 8–23)
CO2: 23 mmol/L (ref 22–32)
Calcium: 9.2 mg/dL (ref 8.9–10.3)
Chloride: 97 mmol/L — ABNORMAL LOW (ref 98–111)
Creatinine, Ser: 0.76 mg/dL (ref 0.44–1.00)
GFR, Estimated: >60 mL/min (ref >60)
Glucose, Bld: 84 mg/dL (ref 70–99)
Potassium: 3.3 mmol/L — ABNORMAL LOW (ref 3.5–5.1)
Sodium: 135 mmol/L (ref 135–145)

## 2023-10-15 LAB — CBC WITH DIFFERENTIAL/PLATELET
Abs Immature Granulocytes: 0.01 10*3/uL (ref 0.00–0.07)
Basophils Absolute: 0.1 10*3/uL (ref 0.0–0.1)
Basophils Relative: 1 %
Eosinophils Absolute: 0.1 10*3/uL (ref 0.0–0.5)
Eosinophils Relative: 2 %
HCT: 35.6 % — ABNORMAL LOW (ref 36.0–46.0)
Hemoglobin: 11.5 g/dL — ABNORMAL LOW (ref 12.0–15.0)
Immature Granulocytes: 0 %
Lymphocytes Relative: 24 %
Lymphs Abs: 1.5 10*3/uL (ref 0.7–4.0)
MCH: 26 pg (ref 26.0–34.0)
MCHC: 32.3 g/dL (ref 30.0–36.0)
MCV: 80.5 fL (ref 80.0–100.0)
Monocytes Absolute: 0.4 10*3/uL (ref 0.1–1.0)
Monocytes Relative: 6 %
Neutro Abs: 4.4 10*3/uL (ref 1.7–7.7)
Neutrophils Relative %: 67 %
Platelets: 202 10*3/uL (ref 150–400)
RBC: 4.42 MIL/uL (ref 3.87–5.11)
RDW: 17.6 % — ABNORMAL HIGH (ref 11.5–15.5)
WBC: 6.5 10*3/uL (ref 4.0–10.5)
nRBC: 0 % (ref 0.0–0.2)

## 2023-10-15 LAB — TROPONIN I (HIGH SENSITIVITY)
Troponin I (High Sensitivity): 6 ng/L (ref <18)
Troponin I (High Sensitivity): 7 ng/L (ref <18)

## 2023-10-15 LAB — PROTIME-INR
INR: 1.1 (ref 0.8–1.2)
Prothrombin Time: 13.9 s (ref 11.4–15.2)

## 2023-10-15 LAB — GLUCOSE, CAPILLARY: Glucose-Capillary: 87 mg/dL (ref 70–99)

## 2023-10-15 LAB — TSH: TSH: 0.781 u[IU]/mL (ref 0.350–4.500)

## 2023-10-15 LAB — MAGNESIUM: Magnesium: 1.8 mg/dL (ref 1.7–2.4)

## 2023-10-15 SURGERY — CANCELLED PROCEDURE
Anesthesia: General

## 2023-10-15 MED ORDER — PRAVASTATIN SODIUM 40 MG PO TABS
20.0000 mg | ORAL_TABLET | Freq: Every day | ORAL | Status: DC
  Administered 2023-10-15: 20 mg via ORAL
  Filled 2023-10-15: qty 1

## 2023-10-15 MED ORDER — ONDANSETRON HCL 4 MG PO TABS: 4.0000 mg | ORAL_TABLET | Freq: Four times a day (QID) | ORAL | Status: DC | PRN

## 2023-10-15 MED ORDER — ONDANSETRON HCL 4 MG/2ML IJ SOLN
4.0000 mg | Freq: Four times a day (QID) | INTRAMUSCULAR | Status: DC | PRN
Start: 2023-10-15 — End: 2023-10-16

## 2023-10-15 MED ORDER — ACETAMINOPHEN 650 MG RE SUPP: 650.0000 mg | Freq: Four times a day (QID) | RECTAL | Status: DC | PRN

## 2023-10-15 MED ORDER — PANTOPRAZOLE SODIUM 40 MG PO TBEC
40.0000 mg | DELAYED_RELEASE_TABLET | Freq: Two times a day (BID) | ORAL | Status: DC
  Administered 2023-10-15 – 2023-10-16 (×2): 40 mg via ORAL
  Filled 2023-10-15 (×2): qty 1

## 2023-10-15 MED ORDER — POTASSIUM CHLORIDE CRYS ER 20 MEQ PO TBCR
40.0000 meq | EXTENDED_RELEASE_TABLET | ORAL | Status: AC
Start: 2023-10-15 — End: 2023-10-15
  Administered 2023-10-15 (×2): 40 meq via ORAL
  Filled 2023-10-15 (×2): qty 2

## 2023-10-15 MED ORDER — LACTATED RINGERS IV SOLN: INTRAVENOUS | Status: DC

## 2023-10-15 MED ORDER — ACETAMINOPHEN 325 MG PO TABS: 650.0000 mg | ORAL_TABLET | Freq: Four times a day (QID) | ORAL | Status: DC | PRN

## 2023-10-15 MED ORDER — POLYETHYLENE GLYCOL 3350 17 G PO PACK: 17.0000 g | PACK | Freq: Every day | ORAL | Status: DC | PRN

## 2023-10-15 NOTE — H&P (Signed)
 History and Physical    Kristine Zamora ZOX:[id number] DOB: 1946-01-22 DOA: 10/15/2023  PCP: Kathyleen Parkins, MD   Patient coming from: Home  I have personally briefly reviewed patient's old medical records in [facility]  Chief Complaint: Bradycardia  HPI: Kristine Zamora is a 78 y.o. female with medical history significant for asthma, diabetes mellitus, hypertension, iron deficiency anemia. Patient was planned for EGD and colonoscopy today, as she was found to be in atrial fibrillation with slow RVR, the procedure was canceled and she was referred to the ED.  Heart rate was down to 40s.  Patient reports dizziness over the past 3 years that is unchanged from baseline.  She reports feeling winded with exertion- like climbing uphill .  She denies chest pain or palpitations.  No lower extremity swelling.  Recent hospitalization 4/10 to 4/12-for symptomatic anemia due to iron deficiency.  She was admitted with hemoglobin of 7.8, transfused 2 units, hemoglobin improved to 10.5 on discharge, anemia panel was suggestive of severe iron deficiency.  She was to follow-up as outpatient  for EGD and colonoscopy.  ED Course: Temperature 98.3.  Heart rate 42 to 69.  Respiratory rate 11-19.  Blood pressure systolic 140s to 811B.  O2 sat greater 97% on room air. Hemoglobin stable at 11.5.  INR 1.1.  Troponin 6. EDP contacted cardiologist Dr. Amanda Jungling, initial plan was for discharge from the ER to arrange for outpatient Zio patch, but GI requested admission for clearance and endocopy while hospitalized.  Review of Systems: As per HPI all other systems reviewed and negative.  Past Medical History:  Diagnosis Date   Anemia    Arthritis    HANDS AND FEET   Asthma    Back pain    Diabetes mellitus without complication (HCC)    TYPE 2   Dyspnea    with exersion    GERD (gastroesophageal reflux disease)    High cholesterol    Hypertension    Lichen planus atrophicus    PONV (postoperative nausea  and vomiting)    Right renal mass     Past Surgical History:  Procedure Laterality Date   BREAST SURGERY  1966   MILK TUBE REMOVED   COLONOSCOPY  2008   Dr. Riley Cheadle: pancolonic diverticula, hyperplastic rectosigmoid polyp   COLONOSCOPY N/A 01/16/2015   Procedure: COLONOSCOPY;  Surgeon: Suzette Espy, MD;  [facility];  Service: Endoscopy;  Laterality: N/A;  0900   COLONOSCOPY WITH PROPOFOL  N/A 04/21/2022   Procedure: COLONOSCOPY WITH PROPOFOL ;  Surgeon: Suzette Espy, MD;  [facility];  Service: Endoscopy;  Laterality: N/A;  10:30 AM   IR RADIOLOGIST EVAL & MGMT  01/19/2019   IR RADIOLOGIST EVAL & MGMT  03/16/2019   IR RADIOLOGIST EVAL & MGMT  06/15/2019   IR RADIOLOGIST EVAL & MGMT  12/22/2019   IR RADIOLOGIST EVAL & MGMT  12/26/2020   IR RADIOLOGIST EVAL & MGMT  12/26/2021   IR RADIOLOGIST EVAL & MGMT  03/09/2023   POLYPECTOMY  04/21/2022   Procedure: POLYPECTOMY;  Surgeon: Suzette Espy, MD;  [facility];  Service: Endoscopy;;   RADIOLOGY WITH ANESTHESIA Right 02/16/2019   Procedure: CT CRYOABLATION RIGHT RENAL MASS;  Surgeon: Lucinda Saber, MD;  Location: [facility];  Service: Anesthesiology;  Laterality: Right;   TONSILLECTOMY  1965     reports that she quit smoking about 28 years ago. Her smoking use included cigarettes. She has never used smokeless tobacco. She  reports that she does not drink alcohol and does not use drugs.  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 She thinks it was something they sprayed them with. JUST THE PEELS, thinks it was the insecticide    Family History  Problem Relation Age of Onset   Alcohol abuse Mother        old age   Colon cancer Father        diagnosed in his early 51s   Breast cancer Daughter     Prior to Admission medications   Medication Sig Start Date End Date Taking? Authorizing Provider  acetaminophen  (TYLENOL ) 500 MG tablet Take 1,000  mg by mouth every 6 (six) hours as needed for mild pain (pain score 1-3).   Yes [provider]  aspirin EC 81 MG tablet Take 81 mg by mouth daily. Swallow whole.   Yes [provider]  Cholecalciferol (VITAMIN D3) 50 MCG (2000 UT) TABS Take 2,000 Units by mouth daily.   Yes [provider]  clobetasol  cream (TEMOVATE ) 0.05 % Apply 1 Application topically 2 (two) times daily. 10/02/23  Yes [provider]  FEROSUL 325 (65 Fe) MG tablet Take 325 mg by mouth 3 (three) times daily. 08/31/23  Yes [provider]  lisinopril  (PRINIVIL ,ZESTRIL ) 10 MG tablet Take 10 mg by mouth in the morning.   Yes [provider]  metFORMIN  (GLUCOPHAGE ) 1000 MG tablet Take 1,000 mg by mouth in the morning and at bedtime. 10/11/18  Yes [provider]  pantoprazole  (PROTONIX ) 40 MG tablet Take 40 mg by mouth 2 (two) times daily. 02/20/22  Yes [provider]  pravastatin  (PRAVACHOL ) 20 MG tablet Take 20 mg by mouth at bedtime.    Yes [provider]    Physical Exam: Vitals:   10/15/23 1600 10/15/23 1630 10/15/23 1700 10/15/23 1730  BP: (!) 146/59 (!) 146/56 (!) 141/71 (!) 146/120  Pulse: (!) 42 (!) 57 64 67  Resp: 15 11 17 19   Temp:      TempSrc:      SpO2: 97% 97% 97% 97%  Weight:      Height:        Constitutional: NAD, calm, comfortable Vitals:   10/15/23 1600 10/15/23 1630 10/15/23 1700 10/15/23 1730  BP: (!) 146/59 (!) 146/56 (!) 141/71 (!) 146/120  Pulse: (!) 42 (!) 57 64 67  Resp: 15 11 17 19   Temp:      TempSrc:      SpO2: 97% 97% 97% 97%  Weight:      Height:       Eyes: PERRL, lids and conjunctivae normal ENMT: Mucous membranes are moist.  Neck: normal, supple, no masses, no thyromegaly Respiratory: clear to auscultation bilaterally, no wheezing, no crackles. Normal respiratory effort. No accessory muscle use.  Cardiovascular: Heart rate very transiently dropped down to 41 at the time of my evaluation,  spontaneously improved to 60s.  Regular rate and rhythm, no murmurs / rubs / gallops. No extremity edema. 2+ pedal pulses. No carotid bruits.  Extremities warm. Abdomen: no tenderness, no masses palpated. No hepatosplenomegaly. Bowel sounds positive.  Musculoskeletal: no clubbing / cyanosis. No joint deformity upper and lower extremities.  Skin: no rashes, lesions, ulcers. No induration Neurologic: No facial asymmetry, moving extremities spontaneously, speech fluent. Psychiatric: Normal judgment and insight. Alert and oriented x 3. Normal mood.   Labs on Admission: I have personally reviewed following labs and imaging  studies  CBC: Recent Labs  Lab 10/15/23 1512  WBC 6.5  NEUTROABS 4.4  HGB 11.5*  HCT 35.6*  MCV 80.5  PLT 202   Basic Metabolic Panel: Recent Labs  Lab 10/15/23 1512  NA 135  K 3.3*  CL 97*  CO2 23  GLUCOSE 84  BUN 15  CREATININE 0.76  CALCIUM 9.2  MG 1.8   Coagulation Profile: Recent Labs  Lab 10/15/23 1512  INR 1.1   CBG: Recent Labs  Lab 10/15/23 1316  GLUCAP 87   Thyroid  Function Tests: Recent Labs    10/15/23 1512  TSH 0.781   Urine analysis:    Component Value Date/Time   COLORURINE STRAW (A) 09/10/2023 1740   APPEARANCEUR CLEAR 09/10/2023 1740   LABSPEC 1.005 09/10/2023 1740   PHURINE 5.0 09/10/2023 1740   GLUCOSEU NEGATIVE 09/10/2023 1740   HGBUR NEGATIVE 09/10/2023 1740   BILIRUBINUR NEGATIVE 09/10/2023 1740   KETONESUR NEGATIVE 09/10/2023 1740   PROTEINUR NEGATIVE 09/10/2023 1740   NITRITE NEGATIVE 09/10/2023 1740   LEUKOCYTESUR NEGATIVE 09/10/2023 1740    Radiological Exams on Admission: No results found.  EKG: Independently reviewed.  Initial EKG with PACs, rate 67, P waves present.  Last EKG shows rates down to 40, P waves small but present, rhythm regular.  QTc 399.  No significant change from prior.  Assessment/Plan Principal Problem:   Bradycardia Active Problems:   IDA (iron deficiency anemia)   Hypokalemia    Assessment and Plan: * Bradycardia Asymptomatic. Heart rate ranging from 41 to 69.  No atrial fibrillation on EKG , but EKG 1523 - shows sinus bradycardia rate 41, with 221 AV block.  P waves are small but present, and rhythm is regular.  Prior EKG shows sinus rhythm, PACs present. -Initially planned EGD and colonoscopy, now canceled, GI requesting cardiac eval/clearance. -Initial plans for outpatient Zio patch, but as patient is planned for endoscopic evaluation, cardiologist- Dr. Albert Huff, recommended admission.  Recommend systolic blood pressure- 140.  Avoid over correcting blood pressure. -Replete potassium -TSH 0.78. - Troponin 6 > 7.  Hypokalemia Potassium 3.3.  Magnesium  1.8.   - Replete K  IDA (iron deficiency anemia) Recent hospitalization for iron deficiency anemia, required 2 units PRBC.  Hemoglobin stable today 11.5. - Clear liquid diet, n.p.o. midnight pending GI evaluation   DVT prophylaxis: SCDS for now, pending GI eval, and unknown etiology of iron defc anemia Code Status: FULL Family Communication: Sister and daughter- Scientist, research (medical) at bedside Disposition Plan: ~ 2 days Consults called: GI, cardiology Admission status:  Obs tele    Author: Pati Bonine, MD 10/15/2023 8:09 PM  For on call review [email or web address].

## 2023-10-15 NOTE — Anesthesia Preprocedure Evaluation (Signed)
 Anesthesia Evaluation  Patient identified by MRN, date of birth, ID band Patient awake    Reviewed: Allergy & Precautions, H&P , NPO status , Patient's Chart, lab work & pertinent test results, reviewed documented beta blocker date and time   History of Anesthesia Complications (+) PONV and history of anesthetic complications  Airway Mallampati: II  TM Distance: >3 FB Neck ROM: full    Dental no notable dental hx.    Pulmonary neg pulmonary ROS, shortness of breath, asthma , former smoker   Pulmonary exam normal breath sounds clear to auscultation       Cardiovascular Exercise Tolerance: Good hypertension, negative cardio ROS  Rhythm:regular Rate:Normal     Neuro/Psych negative neurological ROS  negative psych ROS   GI/Hepatic negative GI ROS, Neg liver ROS,GERD  ,,  Endo/Other  negative endocrine ROSdiabetes    Renal/GU Renal diseasenegative Renal ROS  negative genitourinary   Musculoskeletal   Abdominal   Peds  Hematology negative hematology ROS (+) Blood dyscrasia, anemia   Anesthesia Other Findings   Reproductive/Obstetrics negative OB ROS                             Anesthesia Physical Anesthesia Plan  ASA: 2  Anesthesia Plan: General   Post-op Pain Management:    Induction:   PONV Risk Score and Plan: Propofol  infusion  Airway Management Planned:   Additional Equipment:   Intra-op Plan:   Post-operative Plan:   Informed Consent: I have reviewed the patients History and Physical, chart, labs and discussed the procedure including the risks, benefits and alternatives for the proposed anesthesia with the patient or authorized representative who has indicated his/her understanding and acceptance.     Dental Advisory Given  Plan Discussed with: CRNA  Anesthesia Plan Comments:        Anesthesia Quick Evaluation

## 2023-10-15 NOTE — Progress Notes (Signed)
 Patient presented today for an EGD and colonoscopy due to profound iron deficiency anemia.  She has been found to be in atrial fibrillation with a slow ventricular response.  This is a new finding.  Discussed with Dr. Margrette Shield, anesthesia.  The procedure for today is canceled.  I spoken to Dr. Aldean Amass in the ED;  will get her down there to be further assessed.  I would anticipate her being admitted overnight to the hospitalist where further assessment can be done.  It is possible that we could still get the EGD and colonoscopy done while she is here, keeping her on clear liquids, pending cardiac evaluation.  Would be ideal to get her done prior to going on anticoagulation.

## 2023-10-15 NOTE — Assessment & Plan Note (Addendum)
 Recent hospitalization for iron deficiency anemia, required 2 units PRBC.  Hemoglobin stable today 11.5. - Clear liquid diet, n.p.o. midnight pending GI evaluation

## 2023-10-15 NOTE — Progress Notes (Signed)
 Discussed patient with ER staff. Sent over from endoscopy with irregular heart rhythm, bradycardia. From detailed review of EKGs and telemetry this looks to be a wandering atrial pacemaker rhythm with frequent nonconducted PACs. Tele not consistent with high degree heat block, the P waves though hard to visualized at times  do not have set intervals and they vary in morphology. Patient is asymptomatic. Plan for 7 day zio patch and outpatient cardiology appt, monitor can be arranged as outpatient, does not need before leaving ER   Armida Lander MD

## 2023-10-15 NOTE — Assessment & Plan Note (Addendum)
 Asymptomatic. Heart rate ranging from 41 to 69.  No atrial fibrillation on EKG , but EKG 1523 - shows sinus bradycardia rate 41, with 221 AV block.  P waves are small but present, and rhythm is regular.  Prior EKG shows sinus rhythm, PACs present. -Initially planned EGD and colonoscopy, now canceled, GI requesting cardiac eval/clearance. -Initial plans for outpatient Zio patch, but as patient is planned for endoscopic evaluation, cardiologist- Dr. Albert Huff, recommended admission.  Recommend systolic blood pressure- 140.  Avoid over correcting blood pressure. -Replete potassium -TSH 0.78. - Troponin 6 > 7.

## 2023-10-15 NOTE — Telephone Encounter (Signed)
 Per Dr. Amanda Jungling "can she get an out patient 7 day zio patch and new patient appointment with whichever provider has an opening 2-3 weeks "

## 2023-10-15 NOTE — ED Provider Notes (Signed)
 [place] EMERGENCY DEPARTMENT AT [facility] Provider Note   CSN: [id number] Arrival date & time: 10/15/23  1441     History  Chief Complaint  Patient presents with   Irregular Heart Beat    Kristine Zamora is a 78 y.o. female.  HPI    78 year old female comes in with chief complaint of abnormal EKG.  Patient has history of diverticulosis, renal cell tumor and a recent admission for anemia.  Patient was supposed to get endoscopy today, but was found to have heart rate in the 40s and sent to the ER.  Patient denies any chest pain, shortness of breath, palpitations.  She states that she has been feeling dizzy for the last several years, she was just admitted to the hospital and she was informed that her heart rate was low, but she was explained that it was probably because of low hemoglobin.  Patient denies any tick bites recently.  She is not on any new medications.  Home Medications Prior to Admission medications   Medication Sig Start Date End Date Taking? Authorizing Provider  acetaminophen  (TYLENOL ) 500 MG tablet Take 1,000 mg by mouth every 6 (six) hours as needed for mild pain (pain score 1-3).   Yes [provider]  aspirin EC 81 MG tablet Take 81 mg by mouth daily. Swallow whole.   Yes [provider]  Cholecalciferol (VITAMIN D3) 50 MCG (2000 UT) TABS Take 2,000 Units by mouth daily.   Yes [provider]  clobetasol  cream (TEMOVATE ) 0.05 % Apply 1 Application topically 2 (two) times daily. 10/02/23  Yes [provider]  FEROSUL 325 (65 Fe) MG tablet Take 325 mg by mouth 3 (three) times daily. 08/31/23  Yes [provider]  lisinopril  (PRINIVIL ,ZESTRIL ) 10 MG tablet Take 10 mg by mouth in the morning.   Yes [provider]  metFORMIN  (GLUCOPHAGE ) 1000 MG tablet Take 1,000 mg by mouth in the morning and at bedtime. 10/11/18  Yes [provider]  pantoprazole  (PROTONIX ) 40 MG tablet Take 40 mg by mouth 2  (two) times daily. 02/20/22  Yes [provider]  pravastatin  (PRAVACHOL ) 20 MG tablet Take 20 mg by mouth at bedtime.    Yes [provider]      Allergies    Peach [prunus persica]    Review of Systems   Review of Systems  All other systems reviewed and are negative.   Physical Exam Updated Vital Signs BP (!) 146/120   Pulse 67   Temp 97.6 F (36.4 C) (Oral)   Resp 19   Ht 5\' 2"  (1.575 m)   Wt 88 kg   SpO2 97%   BMI 35.48 kg/m  Physical Exam Vitals and nursing note reviewed.  Constitutional:      Appearance: She is well-developed.  HENT:     Head: Atraumatic.  Cardiovascular:     Rate and Rhythm: Regular rhythm. Bradycardia present.  Pulmonary:     Effort: Pulmonary effort is normal.  Musculoskeletal:     Cervical back: Normal range of motion and neck supple.  Skin:    General: Skin is warm and dry.  Neurological:     Mental Status: She is alert and oriented to person, place, and time.     ED Results / Procedures / Treatments   Labs (all labs ordered are listed, but only abnormal results are displayed) Labs Reviewed  BASIC METABOLIC PANEL WITH GFR - Abnormal; Notable for the following components:  Result Value   Potassium 3.3 (*)    Chloride 97 (*)    All other components within normal limits  CBC WITH DIFFERENTIAL/PLATELET - Abnormal; Notable for the following components:   Hemoglobin 11.5 (*)    HCT 35.6 (*)    RDW 17.6 (*)    All other components within normal limits  GLUCOSE, CAPILLARY  PROTIME-INR  MAGNESIUM   TSH  TROPONIN I (HIGH SENSITIVITY)  TROPONIN I (HIGH SENSITIVITY)    EKG EKG Interpretation Date/Time:  Thursday Oct 15 2023 15:23:28 EDT Ventricular Rate:  40 PR Interval:  168 QRS Duration:  95 QT Interval:  489 QTC Calculation: 399 R Axis:   22  Text Interpretation: Possible atrial arrhythmia 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  Procedures .Critical Care  Performed by: Deatra Face, MD Authorized by: Deatra Face, MD   Critical care provider statement:    Critical care time (minutes):  78   Critical care was necessary to treat or prevent imminent or life-threatening deterioration of the following conditions:  Cardiac failure and circulatory failure   Critical care was time spent personally by me on the following activities:  Development of treatment plan with patient or surrogate, discussions with consultants, evaluation of patient's response to treatment, examination of patient, ordering and review of laboratory studies, ordering and review of radiographic studies, ordering and performing treatments and interventions, pulse oximetry, re-evaluation of patient's condition and review of old charts     Medications Ordered in ED Medications - No data to display  ED Course/ Medical Decision Making/ A&P                                 Medical Decision Making Amount and/or Complexity of Data Reviewed Labs: ordered.  Risk Decision regarding hospitalization.   This patient presents to the ED with chief complaint(s) of ABNORMAL EKG with pertinent past medical history of DIVERTICULOSIS.The complaint involves an extensive differential diagnosis and also carries with it a high risk of complications and morbidity.    The differential diagnosis includes - 2nd degree AV block, atrial arrhythmia, A-fib with slow response, sick sinus syndrome, hyperkalemia, severe electrolyte abnormality  The initial plan is to get basic labs. During assessment, patient noted to have heart rate dropped in the 40s.  I can clearly see that she is having P waves before QRS.  She has some arrhythmia as well.  Clinically, it does not appear that she has A-fib.   Additional history obtained: Additional history obtained from family Records reviewed previous admission documents  Independent  labs interpretation:  The following labs were independently interpreted: Troponin, metabolic profile is normal.   Treatment and Reassessment: Patient's telemetry shows that her heart rate will drop into the 40s.  Repeat EKG indicates concerns for likely second-degree type II block.  I will consult cardiology.  Patient is not symptomatic.  Consultation: - Consulted or discussed management/test interpretation with external professional: Dr. Amanda Jungling -he thinks that patient likely has atrial arrhythmia, perhaps high-grade block.  He also thinks patient does not have A-fib.   Reassessment: Unable to get in touch with Dr. Amanda Jungling after hours.  I spoke with Dr. Albert Huff.  We discussed patient's presenting reason, heart rate in the ER, asymptomatic nature with the low heart rate and the desire for GI team to proceed with  scope if possible.  I shared with him that Dr. Amanda Jungling missed the involvement of GI doctor, and recommended outpatient management for now, but I think it would be best to admit the patient instead and have cardiology clear her or expedite the management of bradycardia so that she can get the scope done.  Dr. Albert Huff has reviewed the EKG.  He is comfortable with patient staying at [facility] since she is hemodynamically stable and asymptomatic.  He would want cardiology service to see the patient tomorrow.   Final Clinical Impression(s) / ED Diagnoses Final diagnoses:  Atrial arrhythmia  Bradycardia    Rx / DC Orders ED Discharge Orders     None         Deatra Face, MD 10/15/23 1803

## 2023-10-15 NOTE — Progress Notes (Signed)
 Patient presented today for EGD and colonoscopy. Patient was found to be in new on set Atrial Fibrillation when vitals were taken in pre-op area. Dr. Margrette Shield anesthesiologist and was notified and a new EKG was obtained. Dr. Riley Cheadle was also notified. Both doctors agreed that procedures needed to be cancelled. Patient was taken to the emergency department for further evaluation.

## 2023-10-15 NOTE — Assessment & Plan Note (Signed)
 Potassium 3.3.  Magnesium  1.8.   - Replete K

## 2023-10-15 NOTE — ED Triage Notes (Signed)
 Pt brought to ED from Endoscopy with c/o new onset A. Fib. Pt denies chest pain, fluttering, and SOB.

## 2023-10-15 NOTE — Progress Notes (Signed)
 ON-CALL CARDIOLOGY 10/15/23  Patient's name: Kristine Zamora.   MRN: [id number].    DOB: 09-Oct-1945 Primary care provider: Kathyleen Parkins, MD.  Requesting Provider:  Dr. Lewis Red (location [facility]) Consulting Provider: Olinda Bertrand, DO, [facility] (location [facility])  Interaction regarding this patient's care today: ER physician reached out to on-call cardiologist to review her clinical trajectory and EKGs.  Patient was scheduled for EGD and colonoscopy for the workup of anemia.  She was noted to have an irregular pulse and there was concern that she may have new onset of atrial fibrillation and was referred to ED for further evaluation and management.  EKGs while in the emergency room department regular independently reviewed. 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   Clinically the patient is asymptomatic and hemodynamically stable.  Clinically she does have intermittent episodes of dizziness.  They are planning to reconsider endoscopy tomorrow and ED physician advising further recommendations.     Latest Ref Rng & Units 10/15/2023    3:12 PM 09/12/2023    5:28 AM 09/11/2023    4:01 AM  CBC  WBC 4.0 - 10.5 K/uL 6.5  6.5  7.5   Hemoglobin 12.0 - 15.0 g/dL 40.9  81.1  9.7   Hematocrit 36.0 - 46.0 % 35.6  34.7  31.2   Platelets 150 - 400 K/uL 202  229  225    Lab Results  Component Value Date   NA 135 10/15/2023   K 3.3 (L) 10/15/2023   CO2 23 10/15/2023   GLUCOSE 84 10/15/2023   BUN 15 10/15/2023   CREATININE 0.76 10/15/2023   CALCIUM 9.2 10/15/2023   GFRNONAA >60 10/15/2023     Impression: Bradycardia  Hypokalemia Anemia.  Pre-procedural clearance.   Recommendations: ED physician's initially reached out to Dr. Amanda Jungling who recommended outpatient Zio patch for further evaluation.  Since patient is planning to undergo endoscopy tomorrow it is reasonable to monitor overnight.   Admitted to hospitalist team and evaluate for reversible cause and re-evaluate her in the morning to see if she can undergo endoscopy.   Evaluate for reversible cause. Check TSH, magnesium . Replete potassium. Place on telemetry.  Would avoid over correcting blood pressures.  Recommend SBP 140 mmHg.  Shared decision was to admit to the hospitalist team and cardiology will see in the morning.  As part of this encounter I spoke to [facility], ED physician, reviewed EKGs, labs from 10/15/2023.  Telephone encounter total time: 15 minutes  Awilda Bogus [facility] [place]  [facility]  10/15/2023 7:07 PM

## 2023-10-16 ENCOUNTER — Observation Stay (HOSPITAL_COMMUNITY)

## 2023-10-16 ENCOUNTER — Encounter (HOSPITAL_COMMUNITY)
Admission: RE | Disposition: A | Discharge status: Home / Self Care | Payer: Self-pay | Source: Home / Self Care | Attending: Emergency Medicine

## 2023-10-16 ENCOUNTER — Observation Stay (HOSPITAL_COMMUNITY): Admitting: *Deleted

## 2023-10-16 ENCOUNTER — Encounter (HOSPITAL_COMMUNITY): Payer: Self-pay | Admitting: Internal Medicine

## 2023-10-16 DIAGNOSIS — R001 Bradycardia, unspecified: Secondary | ICD-10-CM | POA: Diagnosis not present

## 2023-10-16 DIAGNOSIS — I10 Essential (primary) hypertension: Secondary | ICD-10-CM | POA: Diagnosis not present

## 2023-10-16 DIAGNOSIS — D124 Benign neoplasm of descending colon: Secondary | ICD-10-CM | POA: Diagnosis not present

## 2023-10-16 DIAGNOSIS — D508 Other iron deficiency anemias: Secondary | ICD-10-CM | POA: Diagnosis not present

## 2023-10-16 DIAGNOSIS — K640 First degree hemorrhoids: Secondary | ICD-10-CM

## 2023-10-16 DIAGNOSIS — D509 Iron deficiency anemia, unspecified: Secondary | ICD-10-CM

## 2023-10-16 DIAGNOSIS — K635 Polyp of colon: Secondary | ICD-10-CM | POA: Diagnosis not present

## 2023-10-16 DIAGNOSIS — Z860100 Personal history of colon polyps, unspecified: Secondary | ICD-10-CM | POA: Diagnosis not present

## 2023-10-16 DIAGNOSIS — Z7984 Long term (current) use of oral hypoglycemic drugs: Secondary | ICD-10-CM | POA: Diagnosis not present

## 2023-10-16 DIAGNOSIS — E119 Type 2 diabetes mellitus without complications: Secondary | ICD-10-CM | POA: Diagnosis not present

## 2023-10-16 HISTORY — PX: COLONOSCOPY: SHX5424

## 2023-10-16 HISTORY — PX: ESOPHAGOGASTRODUODENOSCOPY: SHX5428

## 2023-10-16 LAB — CBC
HCT: 35 % — ABNORMAL LOW (ref 36.0–46.0)
Hemoglobin: 11.1 g/dL — ABNORMAL LOW (ref 12.0–15.0)
MCH: 25.9 pg — ABNORMAL LOW (ref 26.0–34.0)
MCHC: 31.7 g/dL (ref 30.0–36.0)
MCV: 81.6 fL (ref 80.0–100.0)
Platelets: 196 10*3/uL (ref 150–400)
RBC: 4.29 MIL/uL (ref 3.87–5.11)
RDW: 17.9 % — ABNORMAL HIGH (ref 11.5–15.5)
WBC: 6.4 10*3/uL (ref 4.0–10.5)
nRBC: 0 % (ref 0.0–0.2)

## 2023-10-16 LAB — BASIC METABOLIC PANEL WITH GFR
Anion gap: 9 (ref 5–15)
BUN: 14 mg/dL (ref 8–23)
CO2: 27 mmol/L (ref 22–32)
Calcium: 9.3 mg/dL (ref 8.9–10.3)
Chloride: 103 mmol/L (ref 98–111)
Creatinine, Ser: 0.79 mg/dL (ref 0.44–1.00)
GFR, Estimated: >60 mL/min (ref >60)
Glucose, Bld: 87 mg/dL (ref 70–99)
Potassium: 4.3 mmol/L (ref 3.5–5.1)
Sodium: 139 mmol/L (ref 135–145)

## 2023-10-16 LAB — MAGNESIUM: Magnesium: 1.8 mg/dL (ref 1.7–2.4)

## 2023-10-16 LAB — GLUCOSE, CAPILLARY
Glucose-Capillary: 91 mg/dL (ref 70–99)
Glucose-Capillary: 93 mg/dL (ref 70–99)

## 2023-10-16 SURGERY — EGD (ESOPHAGOGASTRODUODENOSCOPY)
Anesthesia: General

## 2023-10-16 MED ORDER — EPHEDRINE SULFATE-NACL 50-0.9 MG/10ML-% IV SOSY
PREFILLED_SYRINGE | INTRAVENOUS | Status: DC | PRN
  Administered 2023-10-16: 10 mg via INTRAVENOUS

## 2023-10-16 MED ORDER — LIDOCAINE 2% (20 MG/ML) 5 ML SYRINGE
INTRAMUSCULAR | Status: DC | PRN
Start: 2023-10-16 — End: 2023-10-16
  Administered 2023-10-16: 60 mg via INTRAVENOUS

## 2023-10-16 MED ORDER — GLYCOPYRROLATE PF 0.2 MG/ML IJ SOSY
PREFILLED_SYRINGE | INTRAMUSCULAR | Status: DC | PRN
Start: 2023-10-16 — End: 2023-10-16
  Administered 2023-10-16 (×2): .1 mg via INTRAVENOUS

## 2023-10-16 MED ORDER — MAGNESIUM SULFATE 2 GM/50ML IV SOLN
2.0000 g | Freq: Once | INTRAVENOUS | Status: AC
  Administered 2023-10-16: 2 g via INTRAVENOUS
  Filled 2023-10-16: qty 50

## 2023-10-16 MED ORDER — LACTATED RINGERS IV SOLN: INTRAVENOUS | Status: DC | PRN

## 2023-10-16 MED ORDER — PROPOFOL 10 MG/ML IV BOLUS
INTRAVENOUS | Status: DC | PRN
  Administered 2023-10-16: 30 mg via INTRAVENOUS
  Administered 2023-10-16: 50 mg via INTRAVENOUS

## 2023-10-16 MED ORDER — PROPOFOL 500 MG/50ML IV EMUL
INTRAVENOUS | Status: DC | PRN
  Administered 2023-10-16: 125 ug/kg/min via INTRAVENOUS

## 2023-10-16 NOTE — Anesthesia Preprocedure Evaluation (Signed)
 Anesthesia Evaluation  Patient identified by MRN, date of birth, ID band Patient awake    Reviewed: Allergy & Precautions, H&P , NPO status , Patient's Chart, lab work & pertinent test results, reviewed documented beta blocker date and time   History of Anesthesia Complications (+) PONV and history of anesthetic complications  Airway Mallampati: II  TM Distance: >3 FB Neck ROM: full    Dental no notable dental hx.    Pulmonary neg pulmonary ROS, shortness of breath, asthma , former smoker   Pulmonary exam normal breath sounds clear to auscultation       Cardiovascular Exercise Tolerance: Good hypertension, negative cardio ROS  Rhythm:irregular Rate:Normal     Neuro/Psych negative neurological ROS  negative psych ROS   GI/Hepatic negative GI ROS, Neg liver ROS,GERD  ,,  Endo/Other  negative endocrine ROSdiabetes    Renal/GU Renal diseasenegative Renal ROS  negative genitourinary   Musculoskeletal   Abdominal   Peds  Hematology negative hematology ROS (+) Blood dyscrasia, anemia   Anesthesia Other Findings Cleared for sedation by cardiology  Reproductive/Obstetrics negative OB ROS                              Anesthesia Physical Anesthesia Plan  ASA: 3  Anesthesia Plan: General   Post-op Pain Management:    Induction:   PONV Risk Score and Plan: Propofol  infusion  Airway Management Planned:   Additional Equipment:   Intra-op Plan:   Post-operative Plan:   Informed Consent: I have reviewed the patients History and Physical, chart, labs and discussed the procedure including the risks, benefits and alternatives for the proposed anesthesia with the patient or authorized representative who has indicated his/her understanding and acceptance.     Dental Advisory Given  Plan Discussed with: CRNA  Anesthesia Plan Comments:         Anesthesia Quick Evaluation

## 2023-10-16 NOTE — Progress Notes (Signed)
 PROGRESS NOTE  Kristine Zamora AOZ:[id number] DOB: 1945/12/03 DOA: 10/15/2023 PCP: Kathyleen Parkins, MD  Brief History:  78 year old female with a history of asthma, diabetes mellitus type 2, hypertension, iron deficiency anemia presented for EGD and colonoscopy on 10/15/2023.  The patient was noted to have bradycardia with heart rate down to the 40s.  Her procedure was canceled. The patient had denied any fevers, chills, chest pain, shortness breath, nausea, vomiting, diarrhea. She has had dizziness on and off for the last 5 years that she stated is primarily related to walking uphill.  She is able to do her normal daily activities and housework without any chest pain shortness breath or dizziness. Initially, the plan was to send the patient home with a Zio patch, , but as patient is planned for endoscopic evaluation, cardiologist- Dr. Albert Huff, recommended admission.  Recommend systolic blood pressure- 140.  Avoid over correcting blood pressure.   In the ED, the patient was afebrile hemodynamically stable with oxygen saturation 97% room air.  WBC 6.4, hemoglobin 11.1, platelets 196.  Sodium 139, potassium 4.3, bicarbonate 0.79.  Troponin 6>> 7.  EKG showed sinus tachycardia with  ?AV block. Cardiology and GI were consulted to assist with management.   Assessment/Plan: Bradycardia -Concerned about AV block and pauses - Cardiology consult -Initial plans for outpatient Zio patch, but as patient is planned for endoscopic evaluation, cardiologist- Dr. Albert Huff, recommended admission.  Recommend systolic blood pressure- 140.  Avoid over correcting blood pressure.  - TSH 0.781  Hypokalemia -Potassium 3.3.  Magnesium  1.8.   - Repleted K   IDA (iron deficiency anemia) Recent hospitalization for iron deficiency anemia, required 2 units PRBC. -Hgb since then  Mixed hyperlipidemia -continue statin      Family Communication: no  Family at bedside  Consultants:  cardiology, GI  Code  Status:  FULL   DVT Prophylaxis:  SCDs   Procedures: As Listed in Progress Note Above  Antibiotics: None       Subjective: Patient denies fevers, chills, headache, chest pain, dyspnea, nausea, vomiting, diarrhea, abdominal pain, dysuria, hematuria, hematochezia, and melena.   Objective: Vitals:   10/15/23 1846 10/15/23 1906 10/15/23 2259 10/16/23 0306  BP: (!) 184/80  (!) 155/67 (!) 146/83  Pulse: (!) 42  74 73  Resp: 18  16 15   Temp: 98.3 F (36.8 C)  98.3 F (36.8 C) 98.5 F (36.9 C)  TempSrc: Oral  Oral Oral  SpO2: 98% 98% 97% 95%  Weight:      Height:       No intake or output data in the 24 hours ending 10/16/23 0833 Weight change:  Exam:  General:  Pt is alert, follows commands appropriately, not in acute distress HEENT: No icterus, No thrush, No neck mass, [place]/AT Cardiovascular: RRR, S1/S2, no rubs, no gallops Respiratory: CTA bilaterally, no wheezing, no crackles, no rhonchi Abdomen: Soft/+BS, non tender, non distended, no guarding Extremities: No edema, No lymphangitis, No petechiae, No rashes, no synovitis   Data Reviewed: I have personally reviewed following labs and imaging studies Basic Metabolic Panel: Recent Labs  Lab 10/15/23 1512 10/16/23 0419  NA 135 139  K 3.3* 4.3  CL 97* 103  CO2 23 27  GLUCOSE 84 87  BUN 15 14  CREATININE 0.76 0.79  CALCIUM 9.2 9.3  MG 1.8 1.8   Liver Function Tests: No results for input(s): "AST", "ALT", "ALKPHOS", "BILITOT", "PROT", "ALBUMIN" in the last 168 hours. No results for  input(s): "LIPASE", "AMYLASE" in the last 168 hours. No results for input(s): "AMMONIA" in the last 168 hours. Coagulation Profile: Recent Labs  Lab 10/15/23 1512  INR 1.1   CBC: Recent Labs  Lab 10/15/23 1512 10/16/23 0419  WBC 6.5 6.4  NEUTROABS 4.4  --   HGB 11.5* 11.1*  HCT 35.6* 35.0*  MCV 80.5 81.6  PLT 202 196   Cardiac Enzymes: No results for input(s): "CKTOTAL", "CKMB", "CKMBINDEX", "TROPONINI" in the last  168 hours. BNP: Invalid input(s): "POCBNP" CBG: Recent Labs  Lab 10/15/23 1316  GLUCAP 87   HbA1C: No results for input(s): "HGBA1C" in the last 72 hours. Urine analysis:    Component Value Date/Time   COLORURINE STRAW (A) 09/10/2023 1740   APPEARANCEUR CLEAR 09/10/2023 1740   LABSPEC 1.005 09/10/2023 1740   PHURINE 5.0 09/10/2023 1740   GLUCOSEU NEGATIVE 09/10/2023 1740   HGBUR NEGATIVE 09/10/2023 1740   BILIRUBINUR NEGATIVE 09/10/2023 1740   KETONESUR NEGATIVE 09/10/2023 1740   PROTEINUR NEGATIVE 09/10/2023 1740   NITRITE NEGATIVE 09/10/2023 1740   LEUKOCYTESUR NEGATIVE 09/10/2023 1740   Sepsis Labs: @LABRCNTIP (procalcitonin:4,lacticidven:4) )No results found for this or any previous visit (from the past 240 hours).   Scheduled Meds:  pantoprazole   40 mg Oral BID   pravastatin   20 mg Oral QHS   Continuous Infusions:  magnesium  sulfate bolus IVPB      Procedures/Studies: No results found.  Demaris Fillers, DO  [facility]  If 7PM-7AM, please contact night-coverage [email or web address] Password [facility] 10/16/2023, 8:33 AM   LOS: 0 days

## 2023-10-16 NOTE — Progress Notes (Signed)
 2D echo attempted, patient OTF. WIll try later.

## 2023-10-16 NOTE — Discharge Summary (Signed)
 Physician Discharge Summary   Patient: Kristine Zamora MRN: [id number] DOB: [birth year]  Admit date:     10/15/2023  Discharge date: 10/16/23  Discharge Physician: Myrtie Atkinson Sena Hoopingarner   PCP: Kathyleen Parkins, MD   Recommendations at discharge:   Please follow up with primary care provider within 1-2 weeks  Please repeat BMP and CBC in one week     Hospital Course: 78 year old female with a history of asthma, diabetes mellitus type 2, hypertension, iron deficiency anemia presented for EGD and colonoscopy on 10/15/2023.  The patient was noted to have bradycardia with heart rate down to the 40s.  Her procedure was canceled. The patient had denied any fevers, chills, chest pain, shortness breath, nausea, vomiting, diarrhea. She has had dizziness on and off for the last 5 years that she stated is primarily related to walking uphill.  She is able to do her normal daily activities and housework without any chest pain shortness breath or dizziness. Initially, the plan was to send the patient home with a Zio patch, , but as patient is planned for endoscopic evaluation, cardiologist- Dr. Albert Huff, recommended admission.  Recommend systolic blood pressure- 140.  Avoid over correcting blood pressure.   In the ED, the patient was afebrile hemodynamically stable with oxygen saturation 97% room air.  WBC 6.4, hemoglobin 11.1, platelets 196.  Sodium 139, potassium 4.3, bicarbonate 0.79.  Troponin 6>> 7.  EKG showed sinus tachycardia with  ?AV block. Cardiology and GI were consulted to assist with management.  Assessment and Plan:  Bradycardia -Concerned about AV block and pauses - Cardiology consult appreciated>> - Dr. Amanda Jungling discussed with EP--Suggestion of sinus rhythm with non conducted PACs vs 2:1 AV block>>outpt follow up with Dr. Manya Sells on Monday 10/19/23 -EP is ok proceeding with endoscopy, if progressive bradycardia could initiate dopamine drip to sustain heart rates. At this time heart would not be a  contraindication to procedure.  - TSH 0.781   Hypokalemia -Potassium 3.3.  Magnesium  1.8.   - Repleted K   IDA (iron deficiency anemia) Recent hospitalization for iron deficiency anemia, required 2 units PRBC. 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 She thinks it was something they sprayed them with. JUST THE PEELS, thinks it was the insecticide        Medication List     TAKE these medications    acetaminophen  500 MG tablet Commonly known as: TYLENOL  Take 1,000 mg by mouth every 6 (six) hours as needed for mild pain (pain score 1-3).   aspirin EC 81 MG tablet Take 81 mg by mouth daily. Swallow whole.   clobetasol  cream 0.05 % Commonly known as: TEMOVATE  Apply 1 Application topically 2 (two) times daily.   FeroSul 325 (65 Fe) MG tablet Generic drug: ferrous sulfate  Take 325 mg by mouth 3 (three) times daily.   lisinopril  10 MG tablet Commonly known as: ZESTRIL  Take 10 mg by mouth in the morning.   metFORMIN  1000 MG tablet Commonly known as: GLUCOPHAGE  Take 1,000 mg by mouth in the morning and at bedtime.   pantoprazole  40 MG tablet Commonly known  as: PROTONIX  Take 40 mg by mouth 2 (two) times daily.   pravastatin  20 MG tablet Commonly known as: PRAVACHOL  Take 20 mg by mouth at bedtime.   Vitamin D3 50 MCG (2000 UT) Tabs Take 2,000 Units by mouth daily.        Follow-up Information     Tammie Fall, MD Follow up on 10/19/2023.   Specialty: Cardiology Why: Cardiology Electrophysiology  Follow-up on 10/19/2023 at 1:45 PM. Please arrive 15 minutea early for paperwork and check-in. Contact information: [address] [place] [address] [phone]                Discharge Exam: Kristine Zamora Weights   10/15/23 1453 10/15/23 1838 10/16/23 1412  Weight: 88 kg 86.7 kg 88 kg   HEENT:  [place]/AT, No thrush, no icterus CV:  RRR, no rub, no S3, no S4 Lung:  CTA, no wheeze, no rhonchi Abd:  soft/+BS, NT Ext:  No edema, no lymphangitis, no synovitis, no rash   Condition at discharge: stable  The results of significant diagnostics from this hospitalization (including imaging, microbiology, ancillary and laboratory) are listed below for reference.   Imaging Studies: No results found.  Microbiology: Results for orders placed or performed during the hospital encounter of 02/12/19  Novel Coronavirus, NAA (Hosp order, Send-out to Ref Lab; Elliott Lasecki 18-24 hrs     Status: None   Collection Time: 02/12/19 12:38 PM   Specimen: Nasopharyngeal Swab; Respiratory  Result Value Ref Range Status   SARS-CoV-2, NAA NOT DETECTED NOT DETECTED Final    Comment: (NOTE) This nucleic acid amplification test was developed and its performance characteristics determined by [employer]. Nucleic acid amplification tests include PCR and TMA. This test has not been FDA cleared or approved. This test has been authorized by FDA under an Emergency Use Authorization (EUA). This test is only authorized for the duration of time the declaration that circumstances exist justifying the authorization of the emergency use of in vitro diagnostic tests for detection of SARS-CoV-2 virus and/or diagnosis of COVID-19 infection under section 564(b)(1) of the Act, 21 U.S.C. 829FAO-1(H) (1), unless the authorization is terminated or revoked sooner. When diagnostic testing is negative, the possibility of a false negative result should be considered in the context of a patient's recent exposures and the  presence of clinical signs and symptoms consistent with COVID-19. An individual without symptoms of COVID- 19 and who is not shedding SARS-CoV-2 vi rus would expect to have a negative (not detected) result in this assay. Performed At: [facility] [address] [place], [place] [id number] Pearlean Botts MD GE:[id number]    Coronavirus Source NASOPHARYNGEAL  Final    Comment: Performed at [facility], [address] [place], [place] [address]    Labs: CBC: Recent Labs  Lab 10/15/23 1512 10/16/23 0419  WBC 6.5 6.4  NEUTROABS 4.4  --   HGB 11.5* 11.1*  HCT 35.6* 35.0*  MCV 80.5 81.6  PLT 202 196   Basic Metabolic Panel: Recent Labs  Lab 10/15/23 1512 10/16/23 0419  NA 135 139  K 3.3* 4.3  CL 97* 103  CO2 23 27  GLUCOSE 84 87  BUN 15 14  CREATININE 0.76 0.79  CALCIUM 9.2 9.3  MG 1.8 1.8   Liver Function Tests: No results for input(s): "AST", "ALT", "ALKPHOS", "BILITOT", "PROT", "ALBUMIN" in the last 168 hours. CBG: Recent Labs  Lab 10/15/23 1316 10/16/23 1428 10/16/23 1633  GLUCAP 87 91 93    Discharge time  spent: greater than 30 minutes.  Signed: Demaris Fillers, MD [facility] 10/16/2023

## 2023-10-16 NOTE — Progress Notes (Signed)
 Transition of Care Department [facility]) has reviewed patient and no other TOC needs have been identified at this time. We will continue to monitor patient advancement through interdisciplinary progression rounds. If new patient transition needs arise, please place a TOC consult.   10/16/23 0800  TOC Brief Assessment  Insurance and Status Reviewed  Patient has primary care physician Yes  Home environment has been reviewed Lives alone.  Prior level of function: Independent.  Prior/Current Home Services No current home services  Social Drivers of Health Review SDOH reviewed no interventions necessary  Readmission risk has been reviewed Yes  Transition of care needs no transition of care needs at this time

## 2023-10-16 NOTE — Hospital Course (Signed)
 78 year old female with a history of asthma, diabetes mellitus type 2, hypertension, iron deficiency anemia presented for EGD and colonoscopy on 10/15/2023.  The patient was noted to have bradycardia with heart rate down to the 40s.  Her procedure was canceled. The patient had denied any fevers, chills, chest pain, shortness breath, nausea, vomiting, diarrhea. She has had dizziness on and off for the last 5 years that she stated is primarily related to walking uphill.  She is able to do her normal daily activities and housework without any chest pain shortness breath or dizziness. Initially, the plan was to send the patient home with a Zio patch, , but as patient is planned for endoscopic evaluation, cardiologist- Dr. Albert Huff, recommended admission.  Recommend systolic blood pressure- 140.  Avoid over correcting blood pressure.   In the ED, the patient was afebrile hemodynamically stable with oxygen saturation 97% room air.  WBC 6.4, hemoglobin 11.1, platelets 196.  Sodium 139, potassium 4.3, bicarbonate 0.79.  Troponin 6>> 7.  EKG showed sinus tachycardia with  ?AV block. Cardiology and GI were consulted to assist with management.

## 2023-10-16 NOTE — Op Note (Signed)
 [facility] Patient Name: Kristine Zamora Procedure Date: 10/16/2023 3:39 PM MRN: [id number] Date of Birth: 01-14-1946 Attending MD: Gemma Kelp , MD, [id number] CSN: [id number] Age: 78 Admit Type: Inpatient Procedure:                Upper GI endoscopy Indications:              Unexplained iron deficiency anemia Providers:                Gemma Kelp, MD, Willena Harp,                            Crystal Page, Sharlette Dayhoff Technician, Technician Referring MD:              Medicines:                Propofol  per Anesthesia Complications:            No immediate complications. Estimated Blood Loss:     Estimated blood loss: none. Procedure:                Pre-Anesthesia Assessment:                           - Prior to the procedure, a History and Physical                            was performed, and patient medications and                            allergies were reviewed. The patient's tolerance of                            previous anesthesia was also reviewed. The risks                            and benefits of the procedure and the sedation                            options and risks were discussed with the patient.                            All questions were answered, and informed consent                            was obtained. Prior Anticoagulants: The patient has                            taken no anticoagulant or antiplatelet agents. ASA                            Grade Assessment: III - A patient with severe                            systemic disease. After reviewing the risks and  benefits, the patient was deemed in satisfactory                            condition to undergo the procedure.                           After obtaining informed consent, the endoscope was                            passed under direct vision. Throughout the                            procedure, the patient's blood pressure, pulse, and                             oxygen saturations were monitored continuously. The                            [id number]-[id number] ([id number]) scope was introduced through the                            mouth, and advanced to the second part of duodenum.                            The upper GI endoscopy was accomplished without                            difficulty. The patient tolerated the procedure                            well. Scope In: 4:00:12 PM Scope Out: 4:04:24 PM Total Procedure Duration: 0 hours 4 minutes 12 seconds  Findings:      The examined esophagus was normal.      The entire examined stomach was normal. Estimated blood loss: none.      The duodenal bulb and second portion of the duodenum were normal. Impression:               - Normal esophagus.                           - Normal stomach.                           - Normal duodenal bulb and second portion of the                            duodenum.                           - No specimens collected. Moderate Sedation:      Moderate (conscious) sedation was personally administered by an       anesthesia professional. The following parameters were monitored: oxygen       saturation, heart rate, blood pressure, respiratory rate, EKG, adequacy       of pulmonary ventilation, and response to care. Recommendation:           -  Return patient to hospital ward for possible                            discharge same day.                           - Advance diet as tolerated.                           - Continue present medications. See colonoscopy                            report                           - Return to my office (date not yet determined). At                            patient request, I called her daughte, Ned Balint at [phone]?"UJ answer Procedure Code(s):        --- Professional ---                           [phone], Esophagogastroduodenoscopy, flexible,                            transoral;  diagnostic, including collection of                            specimen(s) by brushing or washing, when performed                            (separate procedure) Diagnosis Code(s):        --- Professional ---                           D50.9, Iron deficiency anemia, unspecified CPT copyright 2022 American Medical Association. All rights reserved. The codes documented in this report are preliminary and upon coder review may  be revised to meet current compliance requirements. Windsor Hatcher. Tanetta Fuhriman, MD Gemma Kelp, MD 10/16/2023 4:34:44 PM This report has been signed electronically. Number of Addenda: 0

## 2023-10-16 NOTE — Interval H&P Note (Signed)
 History and Physical Interval Note:  10/16/2023 3:47 PM  Kristine Zamora  has presented today for surgery, with the diagnosis of IDA, GERD, history of colon polyps.  The various methods of treatment have been discussed with the patient and family. After consideration of risks, benefits and other options for treatment, the patient has consented to  Procedure(s): EGD (ESOPHAGOGASTRODUODENOSCOPY) (N/A) COLONOSCOPY (N/A) as a surgical intervention.  The patient's history has been reviewed, patient examined, no change in status, stable for surgery.  I have reviewed the patient's chart and labs.  Questions were answered to the patient's satisfaction.     [name]   patient admitted overnight because of new onset atrial fibrillation.  Seen by cardiology.  Rate controlled.  Cleared to proceed with EGD and colonoscopy for iron deficiency anemia history of multiple colonic adenomas removed previously.  She is not anticoagulated.    Denies dysphagia.  The risks, benefits, limitations, imponderables and alternatives regarding both EGD and colonoscopy have been reviewed with the patient. Questions have been answered. All parties agreeable.

## 2023-10-16 NOTE — Transfer of Care (Signed)
 mmediate Anesthesia Transfer of Care Note  Patient: Kristine Zamora  Procedure(s) Performed: EGD (ESOPHAGOGASTRODUODENOSCOPY) COLONOSCOPY  Patient Location: PACU  Anesthesia Type:General  Level of Consciousness: drowsy and patient cooperative  Airway & Oxygen Therapy: Patient Spontanous Breathing  Post-op Assessment: Report given to RN and Post -op Vital signs reviewed and stable  Post vital signs: Reviewed and stable  Last Vitals:  Vitals Value Taken Time  BP 148/80 10/16/23 1626  Temp 97.8 10/16/23   1626  Pulse 122 10/16/23 1628  Resp 26 10/16/23 1628  SpO2 98 % 10/16/23 1628  Vitals shown include unfiled device data.  Last Pain:  Vitals:   10/16/23 1554  TempSrc:   PainSc: 0-No pain      Patients Stated Pain Goal: 7 (10/15/23 1315)  Complications: No notable events documented.

## 2023-10-16 NOTE — Anesthesia Postprocedure Evaluation (Signed)
 Anesthesia Post Note  Patient: Kristine Zamora  Procedure(s) Performed: EGD (ESOPHAGOGASTRODUODENOSCOPY) COLONOSCOPY  Patient location during evaluation: Phase II Anesthesia Type: General Level of consciousness: awake Pain management: pain level controlled Vital Signs Assessment: post-procedure vital signs reviewed and stable Respiratory status: spontaneous breathing and respiratory function stable Cardiovascular status: blood pressure returned to baseline and stable Postop Assessment: no headache and no apparent nausea or vomiting Anesthetic complications: no Comments: Late entry   No notable events documented.   Last Vitals:  Vitals:   10/16/23 1615 10/16/23 1630  BP: (!) 148/80 (!) 149/41  Pulse: (!) 101 96  Resp: 20 (!) 27  Temp: 36.6 C   SpO2: 98% 98%    Last Pain:  Vitals:   10/16/23 1630  TempSrc:   PainSc: 0-No pain                 Coretha Dew

## 2023-10-16 NOTE — Consult Note (Addendum)
 Cardiology Consultation   Patient ID: ZULEYMA FLORESCA MRN: [id number]; DOB: May 27, 1946  Admit date: 10/15/2023 Date of Consult: 10/16/2023  PCP:  Kathyleen Parkins, MD    HeartCare Providers Cardiologist: New to HeartCare   Patient Profile:   Kristine Zamora is a 78 y.o. female with a hx of HTN, HLD, Type II DM, GERD, iron deficiency anemia and history of right renal neoplasm (s/p cryoablation) who is being seen 10/16/2023 for the evaluation of heart block and preoperative cardiac clearance for EGD at the request of Dr. Winferd Hatter.  History of Present Illness:   Kristine Zamora was admitted to [facility] in 09/2023 for further evaluation and management of newly diagnosed iron deficiency anemia. Hgb was at 7.8 on admission and she received transfusion with hemoglobin up to 9.7 on repeat.  Occult blood was negative. Ferritin was 4 and it was recommended follow-up with GI as an outpatient with plans for EGD/colonoscopy. She did follow-up with GI on 09/29/2023 and EGD and colonoscopy were scheduled for 10/15/2023.  When she presented for the procedure, her initial EKG was felt to be concerning for atrial fibrillation consistent with sinus rhythm with PAC's. She was sent to the ED for further evaluation. Initial rhythms appeared most consistent with wandering atrial pacemaker rhythm and frequently nonconducted PAC's with no high-grade AV block and the plan was for an outpatient monitor. She did have a repeat EKG later while in the ED which was concerning for second-degree type II AV block and overnight observation was recommended.  Initial labs showed WBC 6.5, Hgb 11.5, platelets 202, Na+ 135, K+ 3.3 and creatinine 0.76.  Magnesium  1.8.  TSH 0.781.  Initial and repeat hs Troponin at 6 and 7. Repeat EKG appeared most consistent with 2nd Degree, Type 2 AV block with HR at 40 bpm. She has been monitored on telemetry overnight and appears to have intermittent episodes of second-degree type II AV block with  heart rate in the 30's to 40's. Otherwise, in sinus rhythm with heart rate in the 60's.  In talking with the patient, her sister at the bedside and her daughter who joined by phone, the patient reports she has overall been feeling "great" since her recent hospitalization.  She is active around her home in doing routine chores and enjoys gardening as well. Goes to the grocery store and is able to walk around without any chest pain or dyspnea on exertion. She denies any recent syncopal episodes. Does report having intermittent dizziness over the past several years which occurs with walking up inclines. Denies any dizziness or presyncope this admission. She is unaware of any personal cardiac history. Reports her father had "heart issues" but does not believe he required a PPM.   Past Medical History:  Diagnosis Date   Anemia    Arthritis    HANDS AND FEET   Asthma    Back pain    Diabetes mellitus without complication (HCC)    TYPE 2   Dyspnea    with exersion    GERD (gastroesophageal reflux disease)    High cholesterol    Hypertension    Lichen planus atrophicus    PONV (postoperative nausea and vomiting)    Right renal mass     Past Surgical History:  Procedure Laterality Date   BREAST SURGERY  1966   MILK TUBE REMOVED   COLONOSCOPY  2008   Dr. Riley Cheadle: pancolonic diverticula, hyperplastic rectosigmoid polyp   COLONOSCOPY N/A 01/16/2015   Procedure: COLONOSCOPY;  Surgeon:  Suzette Espy, MD;  [facility];  Service: Endoscopy;  Laterality: N/A;  0900   COLONOSCOPY WITH PROPOFOL  N/A 04/21/2022   Procedure: COLONOSCOPY WITH PROPOFOL ;  Surgeon: Suzette Espy, MD;  Location: [facility];  Service: Endoscopy;  Laterality: N/A;  10:30 AM   IR RADIOLOGIST EVAL & MGMT  01/19/2019   IR RADIOLOGIST EVAL & MGMT  03/16/2019   IR RADIOLOGIST EVAL & MGMT  06/15/2019   IR RADIOLOGIST EVAL & MGMT  12/22/2019   IR RADIOLOGIST EVAL & MGMT  12/26/2020   IR RADIOLOGIST EVAL & MGMT   12/26/2021   IR RADIOLOGIST EVAL & MGMT  03/09/2023   POLYPECTOMY  04/21/2022   Procedure: POLYPECTOMY;  Surgeon: Suzette Espy, MD;  Location: [facility];  Service: Endoscopy;;   RADIOLOGY WITH ANESTHESIA Right 02/16/2019   Procedure: CT CRYOABLATION RIGHT RENAL MASS;  Surgeon: Lucinda Saber, MD;  Location: [facility];  Service: Anesthesiology;  Laterality: Right;   TONSILLECTOMY  1965     Home Medications:  Prior to Admission medications   Medication Sig Start Date End Date Taking? Authorizing Provider  acetaminophen  (TYLENOL ) 500 MG tablet Take 1,000 mg by mouth every 6 (six) hours as needed for mild pain (pain score 1-3).   Yes [provider]  aspirin EC 81 MG tablet Take 81 mg by mouth daily. Swallow whole.   Yes [provider]  Cholecalciferol (VITAMIN D3) 50 MCG (2000 UT) TABS Take 2,000 Units by mouth daily.   Yes [provider]  clobetasol  cream (TEMOVATE ) 0.05 % Apply 1 Application topically 2 (two) times daily. 10/02/23  Yes [provider]  FEROSUL 325 (65 Fe) MG tablet Take 325 mg by mouth 3 (three) times daily. 08/31/23  Yes [provider]  lisinopril  (PRINIVIL ,ZESTRIL ) 10 MG tablet Take 10 mg by mouth in the morning.   Yes [provider]  metFORMIN  (GLUCOPHAGE ) 1000 MG tablet Take 1,000 mg by mouth in the morning and at bedtime. 10/11/18  Yes [provider]  pantoprazole  (PROTONIX ) 40 MG tablet Take 40 mg by mouth 2 (two) times daily. 02/20/22  Yes [provider]  pravastatin  (PRAVACHOL ) 20 MG tablet Take 20 mg by mouth at bedtime.    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 She thinks it was something they sprayed them with. JUST THE PEELS, thinks it was the insecticide    Social History:   Social History   Socioeconomic History   Marital status: Married    Spouse name: Not on file   Number of children: Not on file   Years of education: Not on file   Highest education level: Not on file  Occupational History   Not on file  Tobacco Use   Smoking status: Former    Current packs/day: 0.00    Types: Cigarettes    Quit date: 12/21/1994    Years since quitting: 28.8   Smokeless tobacco: Never  Vaping Use   Vaping status: Never Used  Substance and Sexual Activity   Alcohol use: No    Alcohol/week: 0.0 standard drinks of  alcohol    Comment: quit drinking about 1996   Drug use: No   Sexual activity: Not Currently  Other Topics Concern   Not on file  Social History Narrative   Not on file   Social Drivers of Health   Financial Resource Strain: Not on file  Food Insecurity: No Food Insecurity (10/15/2023)   Hunger Vital Sign    Worried About Running Out of Food in the Last Year: Never true    Ran Out of Food in the Last Year: Never true  Transportation Needs: No Transportation Needs (10/15/2023)   PRAPARE - Administrator, Civil Service (Medical): No    Lack of Transportation (Non-Medical): No  Physical Activity: Not on file  Stress: Not on file  Social Connections: Socially Isolated (10/15/2023)   Social Connection and Isolation Panel [NHANES]    Frequency of Communication with Friends and Family: More than three times a week    Frequency of Social Gatherings with Friends and Family: More than three times a week    Attends Religious Services: Never    Database administrator or Organizations: No    Attends Banker Meetings: Never    Marital Status: Widowed  Intimate Partner Violence: Not At Risk (10/15/2023)   Humiliation, Afraid, Rape, and Kick questionnaire     Fear of Current or Ex-Partner: No    Emotionally Abused: No    Physically Abused: No    Sexually Abused: No    Family History:    Family History  Problem Relation Age of Onset   Alcohol abuse Mother        old age   Colon cancer Father        diagnosed in his early 62s   Breast cancer Daughter      ROS:  Please see the history of present illness.   All other ROS reviewed and negative.     Physical Exam/Data:   Vitals:   10/15/23 1846 10/15/23 1906 10/15/23 2259 10/16/23 0306  BP: (!) 184/80  (!) 155/67 (!) 146/83  Pulse: (!) 42  74 73  Resp: 18  16 15   Temp: 98.3 F (36.8 C)  98.3 F (36.8 C) 98.5 F (36.9 C)  TempSrc: Oral  Oral Oral  SpO2: 98% 98% 97% 95%  Weight:      Height:       No intake or output data in the 24 hours ending 10/16/23 0904    10/15/2023    6:38 PM 10/15/2023    2:53 PM 10/15/2023    1:15 PM  Last 3 Weights  Weight (lbs) 191 lb 1.6 oz 194 lb 194 lb  Weight (kg) 86.682 kg 87.998 kg 87.998 kg     Body mass index is 34.95 kg/m.  General:  Well nourished, well developed female appearing in no acute distress HEENT: normal Neck: no JVD Vascular: No carotid bruits; Distal pulses 2+ bilaterally Cardiac:  normal S1, S2; RRR; no murmur  Lungs:  clear to auscultation bilaterally, no wheezing, rhonchi or rales  Abd: soft, nontender, no hepatomegaly  Ext: no pitting edema Musculoskeletal:  No deformities, BUE and BLE strength normal and equal Skin: warm and dry  Neuro:  CNs 2-12 intact, no focal abnormalities noted Psych:  Normal affect   EKG:  The EKG was personally reviewed and demonstrates:  2nd Degree, Type 2 AV block with HR at 40 bpm Telemetry:  Telemetry was personally reviewed and demonstrates: Intermittent episodes of second-degree type II  AV block with heart rate in the 30's to 40's. Otherwise, in sinus rhythm with heart rate in the 60's.  Relevant CV Studies:  Echocardiogram: Pending  Laboratory Data:  High Sensitivity Troponin:    Recent Labs  Lab 10/15/23 1514 10/15/23 1745  TROPONINIHS 6 7     Chemistry Recent Labs  Lab 10/15/23 1512 10/16/23 0419  NA 135 139  K 3.3* 4.3  CL 97* 103  CO2 23 27  GLUCOSE 84 87  BUN 15 14  CREATININE 0.76 0.79  CALCIUM 9.2 9.3  MG 1.8 1.8  GFRNONAA >60 >60  ANIONGAP 15 9    No results for input(s): "PROT", "ALBUMIN", "AST", "ALT", "ALKPHOS", "BILITOT" in the last 168 hours. Lipids No results for input(s): "CHOL", "TRIG", "HDL", "LABVLDL", "LDLCALC", "CHOLHDL" in the last 168 hours.  Hematology Recent Labs  Lab 10/15/23 1512 10/16/23 0419  WBC 6.5 6.4  RBC 4.42 4.29  HGB 11.5* 11.1*  HCT 35.6* 35.0*  MCV 80.5 81.6  MCH 26.0 25.9*  MCHC 32.3 31.7  RDW 17.6* 17.9*  PLT 202 196   Thyroid   Recent Labs  Lab 10/15/23 1512  TSH 0.781    BNPNo results for input(s): "BNP", "PROBNP" in the last 168 hours.  DDimer No results for input(s): "DDIMER" in the last 168 hours.   Radiology/Studies:  No results found.   Assessment and Plan:   1.  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 K+ initially low at 3.3 and has been replaced. Mg 1.8 and will order supplementation. TSH normal at 0.781. Will order an echocardiogram.  - As discussed above, she reports episodic dizziness with exertion for over 5 years and unclear if this is related to intermittent heart block or not. Reviewed with EP and EKG's felt to be most consistent with 2:1 AV block and if asymptomatic, could have an outpatient EP consult. Will send a message to EP schedulers to arrange. Continue to avoid AV nodal blocking agents.   2. Iron-deficiency Anemia/Clearance for EGD and Colonoscopy - Recently hospitalized for anemia as discussed above and ferritin was low at 4. Received transfusions at that time and started on iron supplementation and hemoglobin remains  stable, at 11.5 today. She is scheduled for EGD and colonoscopy and is frustrated as she has already undergone the preparation for this twice as her initial procedure was delayed due to the water  outage earlier this week and was canceled yesterday due to her cardiac issues at that time.   - Reviewed with Dr. Amanda Jungling and he recommended asking EP to weigh in on if her EGD/colonoscopy should be delayed given her intermittent heart block. Waiting to hear back from them in regards to this.   3. HTN - BP was elevated yesterday evening at 184/80, improved to 146/83 this morning. On Lisinopril  10mg  daily prior to admission but currently held.   4. HLD - She has been continued on PTA Pravastatin  20mg  daily.   For questions or updates, please contact [facility] Please consult [email or web address] for contact info under    Signed, CORIAN RUSE, PA-C  10/16/2023 9:04 AM   Attending Note   Patient seen and discussed with PA Finis Hugger, I agree with her documentation. 78 yo female history of HLD< HTN, DM, prior renal malignancy with prior ablation procedure. Admission 09/2023 with generalized fatigue, managed for symptomatic anemia.  Plans were for outpatient endoscopy. Patient presented for procedure yesterday and found to be bradycardic, referred to ER for evaluation. She denies any significant presyncope or syncope.    Na 135 K 3.3 BUN 15 Cr 0.76 WBC 6.5 Hgb 11.5 Plt 202 Mg 1.8 TSH 0.781  Trop 6-->7 EKG SR with PACs, sometimes nononducted. Some tracings with possible 2:1 AV block    1.Bradycardia - discuss with EP colleagues. Suggestion of sinus rhythm with non conducted PACs vs 2:1 AV block - patient denies any significant symptoms.  - she is not on any av nodal agents, thyroid  testing was normal - we have discussed with EP with plans for outpatient evaluation Monday with Dr Carolynne Citron - EP is ok proceeding with endoscopy, if progressive bradycardia could initiate dopamine drip to sustain  heart rates. At this time heart would not be a contraindication to procedure.     Armida Lander MD

## 2023-10-16 NOTE — Plan of Care (Signed)
  Problem: Health Behavior/Discharge Planning: Goal: Ability to manage health-related needs will improve Outcome: Progressing   Problem: Clinical Measurements: Goal: Ability to maintain clinical measurements within normal limits will improve Outcome: Progressing Goal: Will remain free from infection Outcome: Progressing Goal: Diagnostic test results will improve Outcome: Progressing Goal: Cardiovascular complication will be avoided Outcome: Progressing   Problem: Activity: Goal: Risk for activity intolerance will decrease Outcome: Progressing   Problem: Pain Managment: Goal: General experience of comfort will improve and/or be controlled Outcome: Progressing   Problem: Safety: Goal: Ability to remain free from injury will improve Outcome: Progressing

## 2023-10-16 NOTE — Op Note (Signed)
 [facility] Patient Name: Kristine Zamora Procedure Date: 10/16/2023 3:37 PM MRN: [id number] Date of Birth: 1946-05-07 Attending MD: Gemma Kelp , MD, [id number] CSN: [id number] Age: 78 Admit Type: Inpatient Procedure:                Colonoscopy Indications:              Unexplained iron deficiency anemia Providers:                Gemma Kelp, MD, Willena Harp,                            Crystal Page, Sharlette Dayhoff Technician, Technician Referring MD:              Medicines:                Propofol  per Anesthesia Complications:            No immediate complications. Estimated Blood Loss:     Estimated blood loss was minimal. Procedure:                Pre-Anesthesia Assessment:                           - Prior to the procedure, a History and Physical                            was performed, and patient medications and                            allergies were reviewed. The patient's tolerance of                            previous anesthesia was also reviewed. The risks                            and benefits of the procedure and the sedation                            options and risks were discussed with the patient.                            All questions were answered, and informed consent                            was obtained. Prior Anticoagulants: The patient has                            taken no anticoagulant or antiplatelet agents. ASA                            Grade Assessment: III - A patient with severe                            systemic disease. After reviewing the risks and  benefits, the patient was deemed in satisfactory                            condition to undergo the procedure.                           After obtaining informed consent, the colonoscope                            was passed under direct vision. Throughout the                            procedure, the patient's blood pressure, pulse, and                             oxygen saturations were monitored continuously. The                            [id number]) scope was introduced through the                            anus and advanced to the the cecum, identified by                            appendiceal orifice and ileocecal valve. The                            colonoscopy was performed without difficulty. The                            patient tolerated the procedure well. The quality                            of the bowel preparation was inadequate. The entire                            colon was not well visualized. The ileocecal valve,                            appendiceal orifice, and rectum were photographed. Scope In: 4:08:35 PM Scope Out: 4:21:01 PM Scope Withdrawal Time: 0 hours 6 minutes 45 seconds  Total Procedure Duration: 0 hours 12 minutes 26 seconds  Findings:      The perianal and digital rectal examinations were normal. Prep was       satisfactory in the rectal and sigmoid segments more proximally was       totally unsatisfactory with quite a bit of formed viscous semiformed       mushy stool precluded visualization of much of the colonic mucosa. The       cecum and ileocecal valve were identified. A significant lesion could       have been obscured by the poor prep.      Non-bleeding internal hemorrhoids were found during retroflexion. The       hemorrhoids were moderate, medium-sized and Grade I (internal  hemorrhoids that do not prolapse).      Multiple medium-mouthed diverticula were found in the entire colon.      A 5 mm polyp was found in the descending colon. The polyp was       semi-pedunculated. The polyp was removed with a cold snare. Resection       and retrieval were complete. Estimated blood loss was minimal. Impression:               - Preparation of the colon was inadequate.                           - Non-bleeding internal hemorrhoids.                           - Diverticulosis in the  entire examined colon.                           - One 5 mm polyp in the descending colon, removed                            with a cold snare. Resected and retrieved. Moderate Sedation:      Moderate (conscious) sedation was personally administered by an       anesthesia professional. The following parameters were monitored: oxygen       saturation, heart rate, blood pressure, respiratory rate, EKG, adequacy       of pulmonary ventilation, and response to care. Recommendation:           - Patient has a contact number available for                            emergencies. The signs and symptoms of potential                            delayed complications were discussed with the                            patient. Return to normal activities tomorrow.                            Written discharge instructions were provided to the                            patient.                           - Advance diet as tolerated. Follow-up on                            pathology. Return to the office in the near future                            regroup for further GI evaluation including repeat                            colonoscopy. Patient may need a small bowel capsule  study. Ultimately, a hematology evaluation may be                            indicated.                           - New onset atrial fibrillation this admission. No                            contraindication here to beginning anticoagulation.                            Would track H&H closely. I will arrange follow-up                            path and follow-up in our office. From a GI                            standpoint, she can go home later today. See EGD                            report. Procedure Code(s):        --- Professional ---                           [phone], Colonoscopy, flexible; with removal of                            tumor(s), polyp(s), or other lesion(s) by snare                             technique Diagnosis Code(s):        --- Professional ---                           K64.0, First degree hemorrhoids                           D12.4, Benign neoplasm of descending colon                           D50.9, Iron deficiency anemia, unspecified                           K57.30, Diverticulosis of large intestine without                            perforation or abscess without bleeding CPT copyright 2022 American Medical Association. All rights reserved. The codes documented in this report are preliminary and upon coder review may  be revised to meet current compliance requirements. Windsor Hatcher. Iisha Soyars, MD Gemma Kelp, MD 10/16/2023 4:41:52 PM This report has been signed electronically. Number of Addenda: 0

## 2023-10-19 ENCOUNTER — Encounter: Payer: Self-pay | Admitting: Internal Medicine

## 2023-10-19 ENCOUNTER — Ambulatory Visit: Attending: Internal Medicine | Admitting: Internal Medicine

## 2023-10-19 VITALS — BP 124/60 | HR 64 | Ht 62.0 in | Wt 194.0 lb

## 2023-10-19 DIAGNOSIS — R001 Bradycardia, unspecified: Secondary | ICD-10-CM

## 2023-10-19 DIAGNOSIS — Z01812 Encounter for preprocedural laboratory examination: Secondary | ICD-10-CM

## 2023-10-19 LAB — CBC
Hematocrit: 38.8 % (ref 34.0–46.6)
Hemoglobin: 12.1 g/dL (ref 11.1–15.9)
MCH: 26.1 pg — ABNORMAL LOW (ref 26.6–33.0)
MCHC: 31.2 g/dL — ABNORMAL LOW (ref 31.5–35.7)
MCV: 84 fL (ref 79–97)
Platelets: 227 10*3/uL (ref 150–450)
RBC: 4.64 x10E6/uL (ref 3.77–5.28)
RDW: 18.5 % — ABNORMAL HIGH (ref 11.7–15.4)
WBC: 8.6 10*3/uL (ref 3.4–10.8)

## 2023-10-19 NOTE — Patient Instructions (Signed)
 Medication Instructions:  Your physician recommends that you continue on your current medications as directed. Please refer to the Current Medication list given to you today.  *If you need a refill on your cardiac medications before your next appointment, please call your pharmacy*  Lab Work: CBC and BMET--Within 30 days  You may go to any [facility] Location for your lab work:  [facility] - [id number] ([facility]) - [address] [phone] [address]  [place] - [address]   [place]  - [address]   [place] - [address] - [address] [employer]  - [address] [place] - [address] ([facility]   If you have labs (blood work) drawn today and your tests are completely normal, you will receive your results only by: [employer] (if you have MyChart)  If you have any lab test that is abnormal or we need to change your treatment, we will call you or send a MyChart message to review the results.  Testing/Procedures: Pacemaker implant on June 19th.  Follow-Up: At [facility], you and your health needs are our priority.  As part of our continuing mission to provide you with exceptional heart care, we have created designated Provider Care Teams.  These Care Teams include your primary Cardiologist (physician) and Advanced Practice Providers (APPs -  Physician Assistants and Nurse Practitioners) who all work together to provide you with the care you need, when you need it.  Your next appointment:   To be scheduled  The format for your next appointment:   In Person  Provider:   Manya Sells, MD{or one of the following Advanced Practice Providers on your designated Care Team:   Mertha Abrahams, [place] [address] [place], [place] Neda Balk, NP

## 2023-10-19 NOTE — Progress Notes (Signed)
 HPI Ms. Bollen is referred by the ED for evaluation of symptomatic bradycardia and sinus node dysfunction. She has HTN. She is  a pleasant 78 yo woman with a h/o worsening fatigue and weakness. No h/o frank syncope. 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 She gets dizzy at times. She denies chest pain but does have some dyspnea with exertion. She is on no AV nodal blocking drugs.  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 She thinks it was something they sprayed them with. JUST THE PEELS, thinks it was the insecticide     Current Outpatient Medications  Medication Sig Dispense Refill   acetaminophen  (TYLENOL ) 500 MG tablet Take 1,000 mg by mouth every 6 (six) hours as needed for mild pain (pain score 1-3).     aspirin EC 81 MG tablet Take 81 mg by mouth daily. Swallow whole.     Cholecalciferol (VITAMIN D3) 50 MCG (2000 UT) TABS Take 2,000 Units by mouth daily.     clobetasol  cream (TEMOVATE ) 0.05 % Apply 1 Application topically 2 (two) times daily.     FEROSUL 325 (65 Fe) MG tablet Take 325 mg by mouth 3 (three) times daily.     lisinopril  (PRINIVIL ,ZESTRIL ) 10 MG tablet Take 10 mg by mouth in the morning.     metFORMIN  (GLUCOPHAGE ) 1000 MG tablet Take 1,000 mg by mouth in the morning and at bedtime.     pantoprazole  (PROTONIX ) 40 MG tablet Take 40 mg by mouth 2 (two) times daily.     pravastatin  (PRAVACHOL ) 20 MG tablet Take 20 mg by mouth at bedtime.      No current facility-administered medications for this visit.     Past Medical History:  Diagnosis Date   Anemia    Arthritis    HANDS AND FEET   Asthma    Back pain    Diabetes mellitus without complication (HCC)    TYPE 2   Dyspnea    with exersion    GERD (gastroesophageal reflux disease)    High cholesterol    Hypertension    Lichen planus atrophicus    PONV (postoperative nausea and vomiting)    Right renal mass     ROS:   All systems reviewed  and negative except as noted in the HPI.   Past Surgical History:  Procedure Laterality Date   BREAST SURGERY  1966   MILK TUBE REMOVED   COLONOSCOPY  2008   Dr. Riley Cheadle: pancolonic diverticula, hyperplastic rectosigmoid polyp   COLONOSCOPY N/A 01/16/2015   Procedure: COLONOSCOPY;  Surgeon: Suzette Espy, MD;  [facility]: [facility];  Service: Endoscopy;  Laterality: N/A;  0900   COLONOSCOPY WITH PROPOFOL  N/A 04/21/2022   Procedure: COLONOSCOPY WITH PROPOFOL ;  Surgeon: Suzette Espy, MD;  Location: [facility];  Service: Endoscopy;  Laterality: N/A;  10:30 AM   IR RADIOLOGIST EVAL & MGMT  01/19/2019   IR RADIOLOGIST EVAL & MGMT  03/16/2019   IR RADIOLOGIST EVAL & MGMT  06/15/2019   IR RADIOLOGIST EVAL & MGMT  12/22/2019   IR RADIOLOGIST EVAL & MGMT  12/26/2020   IR RADIOLOGIST EVAL & MGMT  12/26/2021   IR RADIOLOGIST EVAL & MGMT  03/09/2023   POLYPECTOMY  04/21/2022   Procedure: POLYPECTOMY;  Surgeon: Suzette Espy, MD;  [facility]: [facility];  Service: Endoscopy;;   RADIOLOGY WITH ANESTHESIA Right 02/16/2019   Procedure: CT  CRYOABLATION RIGHT RENAL MASS;  Surgeon: Lucinda Saber, MD;  Location: [facility];  Service: Anesthesiology;  Laterality: Right;   TONSILLECTOMY  1965     Family History  Problem Relation Age of Onset   Alcohol abuse Mother        old age   Colon cancer Father        diagnosed in his early 46s   Breast cancer Daughter      Social History   Socioeconomic History   Marital status: Married    Spouse name: Not on file   Number of children: Not on file   Years of education: Not on file   Highest education level: Not on file  Occupational History   Not on file  Tobacco Use   Smoking status: Former    Current packs/day: 0.00    Types: Cigarettes    Quit date: 12/21/1994    Years since quitting: 28.8   Smokeless tobacco: Never  Vaping Use   Vaping status: Never Used  Substance and Sexual Activity   Alcohol use: No    Alcohol/week: 0.0 standard  drinks of alcohol    Comment: quit drinking about 1996   Drug use: No   Sexual activity: Not Currently  Other Topics Concern   Not on file  Social History Narrative   Not on file   Social Drivers of Health   Financial Resource Strain: Not on file  Food Insecurity: No Food Insecurity (10/15/2023)   Hunger Vital Sign    Worried About Running Out of Food in the Last Year: Never true    Ran Out of Food in the Last Year: Never true  Transportation Needs: No Transportation Needs (10/15/2023)   PRAPARE - Administrator, Civil Service (Medical): No    Lack of Transportation (Non-Medical): No  Physical Activity: Not on file  Stress: Not on file  Social Connections: Socially Isolated (10/15/2023)   Social Connection and Isolation Panel [NHANES]    Frequency of Communication with Friends and Family: More than three times a week    Frequency of Social Gatherings with Friends and Family: More than three times a week    Attends Religious Services: Never    Database administrator or Organizations: No    Attends Banker Meetings: Never    Marital Status: Widowed  Intimate Partner Violence: Not At Risk (10/15/2023)   Humiliation, Afraid, Rape, and Kick questionnaire    Fear of Current or Ex-Partner: No    Emotionally Abused: No    Physically Abused: No    Sexually Abused: No     BP 124/60   Pulse 64   Ht 5\' 2"  (1.575 m)   Wt 194 lb (88 kg)   SpO2 98%   BMI 35.48 kg/m   Physical Exam:  Well appearing NAD HEENT: Unremarkable Neck:  No JVD, no thyromegally Lymphatics:  No adenopathy Back:  No CVA tenderness Lungs:  Clear with no wheezes HEART:  IRegular rate rhythm, no murmurs, no rubs, no clicks Abd:  soft, positive bowel sounds, no organomegally, no rebound, no guarding Ext:  2 plus pulses, no edema, no cyanosis, no clubbing Skin:  No rashes no nodules Neuro:  CN II through XII intact, motor grossly intact  Assess/Plan: 2:1 AV block - She appears to be  symptomatic. I have discussed the treatment options with the patient and recommend proceeding with PPM insertion.  HTN - we will be able to use a calcium channel or  beta blocker once her PPM is inserted as needed.  Pete Brand Sundance Moise,MD

## 2023-10-19 NOTE — Progress Notes (Signed)
 [facility] [address] [place] [address]   Clinic Day:  10/20/2023  Referring physician: Kathyleen Parkins, MD  Patient Care Team: Kristine Parkins, MD as PCP - General (Internal Medicine) Kristine Cheadle Windsor Hatcher, MD as Consulting Physician (Gastroenterology)   ASSESSMENT & PLAN:   Assessment:  1.  Severe iron deficiency anemia: - Recent admission to the hospital on 09/10/2023 with hemoglobin 7.8, s/p 2 units PRBC.  Ferritin was 4, B12 291. - Started iron tablet 3 times a day in February 2025, has not taken it in the last 1 week.  She gets constipated from iron pills. - Denies BRBPR/melena.  Colonoscopy and EGD on 10/16/2023  2.  Small right renal neoplasm: - S/p cryoablation on 02/16/2019, follows with Dr. Velna Zamora  3.  Social/family history: - Lives by herself at home and is independent of ADLs and IADLs.  Non-smoker. - Sister and mother had anemia.  Sister had melanoma.  Another sister had colon cancer and breast cancer.  Maternal aunt had breast cancer.  Father had colon cancer.  Plan:  1.  Severe iron deficiency anemia: - We reviewed her CBC from 10/19/2023 with hemoglobin normal at 12.1. - Recommend checking ferritin, iron panel, B12, folic acid , MMA, copper, LDH, reticulocyte's, haptoglobin and myeloma panel. - RTC 2 weeks for follow-up.  If iron levels are low, may consider parenteral iron therapy to prevent transfusions.   Orders Placed This Encounter  Procedures   Copper, serum    Standing Status:   Future    Number of Occurrences:   1    Expected Date:   10/20/2023    Expiration Date:   10/19/2024   Lactate dehydrogenase    Standing Status:   Future    Number of Occurrences:   1    Expected Date:   10/20/2023    Expiration Date:   10/19/2024   Methylmalonic acid, serum    Standing Status:   Future    Number of Occurrences:   1    Expected Date:   10/20/2023    Expiration Date:   10/19/2024   Reticulocytes    Standing Status:   Future    Number of  Occurrences:   1    Expected Date:   10/20/2023    Expiration Date:   10/19/2024   Iron and TIBC ([facility] ONLY)    Standing Status:   Future    Number of Occurrences:   1    Expected Date:   10/20/2023    Expiration Date:   10/19/2024   Ferritin    Standing Status:   Future    Number of Occurrences:   1    Expected Date:   10/20/2023    Expiration Date:   10/19/2024   Vitamin B12    Standing Status:   Future    Number of Occurrences:   1    Expected Date:   10/20/2023    Expiration Date:   10/19/2024   Folate    Standing Status:   Future    Number of Occurrences:   1    Expected Date:   10/20/2023    Expiration Date:   10/19/2024   Protein electrophoresis, serum    Standing Status:   Future    Number of Occurrences:   1    Expected Date:   10/20/2023    Expiration Date:   10/19/2024   Immunofixation electrophoresis    Standing Status:   Future  Number of Occurrences:   1    Expected Date:   10/20/2023    Expiration Date:   10/19/2024   Kappa/lambda light chains    Standing Status:   Future    Number of Occurrences:   1    Expected Date:   10/20/2023    Expiration Date:   10/19/2024   Haptoglobin    Standing Status:   Future    Number of Occurrences:   1    Expected Date:   10/20/2023    Expiration Date:   10/19/2024      Kristine Zamora,acting as a scribe for Kristine Boros, MD.,have documented all relevant documentation on the behalf of Kristine Boros, MD,as directed by  Kristine Boros, MD while in the presence of Kristine Boros, MD.   I, Kristine Boros MD, have reviewed the above documentation for accuracy and completeness, and I agree with the above.   Kristine Boros, MD   5/20/20258:59 AM  CHIEF COMPLAINT/PURPOSE OF CONSULT:   Diagnosis: Iron deficiency anemia  Current Therapy: Under workup  HISTORY OF PRESENT ILLNESS:   Kristine Zamora is a 78 y.o. female presenting to clinic today for evaluation of iron deficiency anemia  at the request of Dr. Winferd Zamora.  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   Kristine Zamora was admitted to the hospital from 10/15/23 to 10/16/23 for bradycardia and iron deficiency anemia. She underwent an EGD and colonoscopy as inpatient. EGD findings were normal and the colonoscopy found nonbleeding internal hemorrhoids, pandiverticulosis, and a descending colon polyp. Prior to this hospitalization, she was admitted on 09/10/23 for iron deficiency anemia, requiring 2 units of PRBC's.   She has CT A/P on 09/10/23 that found: Stable post ablated changes within the upper pole of the right kidney. No definite evidence of recurrent or residual disease within the abdomen and pelvis on this limited, single-phase examination. Moderate descending and sigmoid diverticulosis without evidence of acute diverticulitis. Aortic atherosclerosis.  Today, she states that she is doing well overall. Her appetite level is at 80%. Her energy level is at 40%.  PAST MEDICAL HISTORY:   Past Medical History: Past Medical History:  Diagnosis Date   Anemia    Arthritis    HANDS AND FEET   Asthma    Back pain    Diabetes mellitus without complication (HCC)    TYPE 2   Dyspnea    with exersion    GERD (gastroesophageal reflux disease)    High cholesterol    Hypertension    Lichen planus atrophicus    PONV (postoperative nausea and vomiting)    Right renal mass     Surgical History: Past Surgical History:  Procedure Laterality Date   BREAST SURGERY  1966   MILK TUBE REMOVED   COLONOSCOPY  2008   Dr. Riley Cheadle: pancolonic diverticula, hyperplastic rectosigmoid polyp   COLONOSCOPY N/A 01/16/2015   Procedure: COLONOSCOPY;  Surgeon: Kristine Espy, MD;  [facility];  Service: Endoscopy;  Laterality: N/A;  0900   COLONOSCOPY WITH PROPOFOL  N/A 04/21/2022   Procedure: COLONOSCOPY WITH PROPOFOL ;  Surgeon: Kristine Espy, MD;   [facility];  Service: Endoscopy;  Laterality: N/A;  10:30 AM   IR RADIOLOGIST EVAL & MGMT  01/19/2019   IR RADIOLOGIST EVAL & MGMT  03/16/2019   IR RADIOLOGIST EVAL & MGMT  06/15/2019   IR RADIOLOGIST EVAL & MGMT  12/22/2019  IR RADIOLOGIST EVAL & MGMT  12/26/2020   IR RADIOLOGIST EVAL & MGMT  12/26/2021   IR RADIOLOGIST EVAL & MGMT  03/09/2023   POLYPECTOMY  04/21/2022   Procedure: POLYPECTOMY;  Surgeon: Kristine Espy, MD;  [facility];  Service: Endoscopy;;   RADIOLOGY WITH ANESTHESIA Right 02/16/2019   Procedure: CT CRYOABLATION RIGHT RENAL MASS;  Surgeon: Lucinda Saber, MD;  Location: [facility];  Service: Anesthesiology;  Laterality: Right;   TONSILLECTOMY  1965    Social History: Social History   Socioeconomic History   Marital status: Married    Spouse name: Not on file   Number of children: Not on file   Years of education: Not on file   Highest education level: Not on file  Occupational History   Not on file  Tobacco Use   Smoking status: Former    Current packs/day: 0.00    Types: Cigarettes    Quit date: 12/21/1994    Years since quitting: 28.8   Smokeless tobacco: Never  Vaping Use   Vaping status: Never Used  Substance and Sexual Activity   Alcohol use: No    Alcohol/week: 0.0 standard drinks of alcohol    Comment: quit drinking about 1996   Drug use: No   Sexual activity: Not Currently  Other Topics Concern   Not on file  Social History Narrative   Not on file   Social Drivers of Health   Financial Resource Strain: Not on file  Food Insecurity: No Food Insecurity (10/20/2023)   Hunger Vital Sign    Worried About Running Out of Food in the Last Year: Never true    Ran Out of Food in the Last Year: Never true  Transportation Needs: No Transportation Needs (10/20/2023)   PRAPARE - Administrator, Civil Service (Medical): No    Lack of Transportation (Non-Medical): No  Physical Activity: Not on file  Stress: Not on file   Social Connections: Socially Isolated (10/15/2023)   Social Connection and Isolation Panel [NHANES]    Frequency of Communication with Friends and Family: More than three times a week    Frequency of Social Gatherings with Friends and Family: More than three times a week    Attends Religious Services: Never    Database administrator or Organizations: No    Attends Banker Meetings: Never    Marital Status: Widowed  Intimate Partner Violence: Not At Risk (10/20/2023)   Humiliation, Afraid, Rape, and Kick questionnaire    Fear of Current or Ex-Partner: No    Emotionally Abused: No    Physically Abused: No    Sexually Abused: No    Family History: Family History  Problem Relation Age of Onset   Alcohol abuse Mother        old age   Colon cancer Father        diagnosed in his early 76s   Breast cancer Daughter     Current Medications:  Current Outpatient Medications:    acetaminophen  (TYLENOL ) 500 MG tablet, Take 1,000 mg by mouth every 6 (six) hours as needed for mild pain (pain score 1-3)., Disp: , Rfl:    aspirin EC 81 MG tablet, Take 81 mg by mouth daily. Swallow whole., Disp: , Rfl:    Cholecalciferol (VITAMIN D3) 50 MCG (2000 UT) TABS, Take 2,000 Units by mouth daily., Disp: , Rfl:    clobetasol  cream (TEMOVATE ) 0.05 %, Apply 1 Application topically 2 (two) times daily., Disp: ,  Rfl:    FEROSUL 325 (65 Fe) MG tablet, Take 325 mg by mouth 3 (three) times daily., Disp: , Rfl:    lisinopril  (PRINIVIL ,ZESTRIL ) 10 MG tablet, Take 10 mg by mouth in the morning., Disp: , Rfl:    metFORMIN  (GLUCOPHAGE ) 1000 MG tablet, Take 1,000 mg by mouth in the morning and at bedtime., Disp: , Rfl:    pantoprazole  (PROTONIX ) 40 MG tablet, Take 40 mg by mouth 2 (two) times daily., Disp: , Rfl:    pravastatin  (PRAVACHOL ) 20 MG tablet, Take 20 mg by mouth at bedtime. , Disp: , Rfl:    Allergies: Allergies  Allergen Reactions   Peach [Prunus Persica] Anaphylaxis    Tolerates them  without the peel. She thinks it was something they sprayed them with. JUST THE PEELS, thinks it was the insecticide    REVIEW OF SYSTEMS:   Review of Systems  Constitutional:  Negative for chills, fatigue and fever.  HENT:   Negative for lump/mass, mouth sores, nosebleeds, sore throat and trouble swallowing.   Eyes:  Negative for eye problems.  Respiratory:  Positive for cough and shortness of breath.   Cardiovascular:  Negative for chest pain, leg swelling and palpitations.  Gastrointestinal:  Positive for nausea. Negative for abdominal pain, constipation, diarrhea and vomiting.  Genitourinary:  Negative for bladder incontinence, difficulty urinating, dysuria, frequency, hematuria and nocturia.   Musculoskeletal:  Negative for arthralgias, back pain, flank pain, myalgias and neck pain.  Skin:  Negative for itching and rash.  Neurological:  Negative for dizziness, headaches and numbness.  Hematological:  Does not bruise/bleed easily.  Psychiatric/Behavioral:  Positive for sleep disturbance. Negative for depression and suicidal ideas. The patient is not nervous/anxious.   All other systems reviewed and are negative.    VITALS:   Blood pressure (!) 124/94, pulse 78, temperature 99 F (37.2 C), temperature source Tympanic, resp. rate 20, height 5' 2.21" (1.58 m), weight 192 lb 7.4 oz (87.3 kg), SpO2 98%.  Wt Readings from Last 3 Encounters:  10/20/23 192 lb 7.4 oz (87.3 kg)  10/19/23 194 lb (88 kg)  10/16/23 194 lb (88 kg)    Body mass index is 34.97 kg/m.   PHYSICAL EXAM:   Physical Exam Vitals and nursing note reviewed. Exam conducted with a chaperone present.  Constitutional:      Appearance: Normal appearance.  Cardiovascular:     Rate and Rhythm: Normal rate and regular rhythm.     Pulses: Normal pulses.     Heart sounds: Normal heart sounds.  Pulmonary:     Effort: Pulmonary effort is normal.     Breath sounds: Normal breath sounds.  Abdominal:     Palpations:  Abdomen is soft. There is no hepatomegaly, splenomegaly or mass.     Tenderness: There is no abdominal tenderness.  Musculoskeletal:     Right lower leg: No edema.     Left lower leg: No edema.  Lymphadenopathy:     Cervical: No cervical adenopathy.     Right cervical: No superficial, deep or posterior cervical adenopathy.    Left cervical: No superficial, deep or posterior cervical adenopathy.     Upper Body:     Right upper body: No supraclavicular or axillary adenopathy.     Left upper body: No supraclavicular or axillary adenopathy.  Neurological:     General: No focal deficit present.     Mental Status: She is alert and oriented to person, place, and time.  Psychiatric:  Mood and Affect: Mood normal.        Behavior: Behavior normal.     LABS:   CBC    Component Value Date/Time   WBC 8.6 10/19/2023 1436   WBC 6.4 10/16/2023 0419   RBC 4.42 10/20/2023 0836   RBC 4.29 10/16/2023 0419   HGB 12.1 10/19/2023 1436   HCT 38.8 10/19/2023 1436   PLT 227 10/19/2023 1436   MCV 84 10/19/2023 1436   MCH 26.1 (L) 10/19/2023 1436   MCH 25.9 (L) 10/16/2023 0419   MCHC 31.2 (L) 10/19/2023 1436   MCHC 31.7 10/16/2023 0419   RDW 18.5 (H) 10/19/2023 1436   LYMPHSABS 1.5 10/15/2023 1512   MONOABS 0.4 10/15/2023 1512   EOSABS 0.1 10/15/2023 1512   BASOSABS 0.1 10/15/2023 1512    CMP    Component Value Date/Time   NA 138 10/19/2023 1436   K 4.4 10/19/2023 1436   CL 98 10/19/2023 1436   CO2 20 10/19/2023 1436   GLUCOSE 111 (H) 10/19/2023 1436   GLUCOSE 87 10/16/2023 0419   BUN 12 10/19/2023 1436   CREATININE 0.98 10/19/2023 1436   CALCIUM 10.2 10/19/2023 1436   PROT 5.7 (L) 09/12/2023 0528   ALBUMIN 2.9 (L) 09/12/2023 0528   AST 23 09/12/2023 0528   ALT 15 09/12/2023 0528   ALKPHOS 51 09/12/2023 0528   BILITOT 0.7 09/12/2023 0528   GFRNONAA >60 10/16/2023 0419   GFRAA >60 02/17/2019 0313    No results found for: "CEA1", "CEA" / No results found for: "CEA1",  "CEA" No results found for: "PSA1" No results found for: "ZOX096" No results found for: "CAN125"  No results found for: "TOTALPROTELP", "ALBUMINELP", "A1GS", "A2GS", "BETS", "BETA2SER", "GAMS", "MSPIKE", "SPEI" Lab Results  Component Value Date   TIBC 439 09/10/2023   FERRITIN 4 (L) 09/10/2023   IRONPCTSAT 4 (L) 09/10/2023   No results found for: "LDH"   STUDIES:   No results found.

## 2023-10-20 ENCOUNTER — Inpatient Hospital Stay: Attending: Hematology | Admitting: Hematology

## 2023-10-20 ENCOUNTER — Inpatient Hospital Stay

## 2023-10-20 ENCOUNTER — Encounter (HOSPITAL_COMMUNITY): Payer: Self-pay | Admitting: Internal Medicine

## 2023-10-20 VITALS — BP 124/94 | HR 78 | Temp 99.0°F | Resp 20 | Ht 62.21 in | Wt 192.5 lb

## 2023-10-20 DIAGNOSIS — Z79899 Other long term (current) drug therapy: Secondary | ICD-10-CM | POA: Insufficient documentation

## 2023-10-20 DIAGNOSIS — Z803 Family history of malignant neoplasm of breast: Secondary | ICD-10-CM | POA: Insufficient documentation

## 2023-10-20 DIAGNOSIS — Z87891 Personal history of nicotine dependence: Secondary | ICD-10-CM | POA: Insufficient documentation

## 2023-10-20 DIAGNOSIS — Z800 Family history of malignant neoplasm of digestive organs: Secondary | ICD-10-CM | POA: Diagnosis not present

## 2023-10-20 DIAGNOSIS — Z832 Family history of diseases of the blood and blood-forming organs and certain disorders involving the immune mechanism: Secondary | ICD-10-CM | POA: Insufficient documentation

## 2023-10-20 DIAGNOSIS — D508 Other iron deficiency anemias: Secondary | ICD-10-CM

## 2023-10-20 DIAGNOSIS — Z85528 Personal history of other malignant neoplasm of kidney: Secondary | ICD-10-CM | POA: Insufficient documentation

## 2023-10-20 DIAGNOSIS — D509 Iron deficiency anemia, unspecified: Secondary | ICD-10-CM | POA: Insufficient documentation

## 2023-10-20 DIAGNOSIS — Z808 Family history of malignant neoplasm of other organs or systems: Secondary | ICD-10-CM | POA: Diagnosis not present

## 2023-10-20 LAB — IRON AND TIBC
Iron: 22 ug/dL — ABNORMAL LOW (ref 28–170)
Saturation Ratios: 6 % — ABNORMAL LOW (ref 10.4–31.8)
TIBC: 381 ug/dL (ref 250–450)
UIBC: 359 ug/dL

## 2023-10-20 LAB — BASIC METABOLIC PANEL WITH GFR
BUN/Creatinine Ratio: 12 (ref 12–28)
BUN: 12 mg/dL (ref 8–27)
CO2: 20 mmol/L (ref 20–29)
Calcium: 10.2 mg/dL (ref 8.7–10.3)
Chloride: 98 mmol/L (ref 96–106)
Creatinine, Ser: 0.98 mg/dL (ref 0.57–1.00)
Glucose: 111 mg/dL — ABNORMAL HIGH (ref 70–99)
Potassium: 4.4 mmol/L (ref 3.5–5.2)
Sodium: 138 mmol/L (ref 134–144)
eGFR: 59 mL/min/{1.73_m2} — ABNORMAL LOW (ref >59)

## 2023-10-20 LAB — FERRITIN: Ferritin: 39 ng/mL (ref 11–307)

## 2023-10-20 LAB — SURGICAL PATHOLOGY

## 2023-10-20 LAB — RETICULOCYTES
Immature Retic Fract: 7.5 % (ref 2.3–15.9)
RBC.: 4.42 MIL/uL (ref 3.87–5.11)
Retic Count, Absolute: 44.6 10*3/uL (ref 19.0–186.0)
Retic Ct Pct: 1 % (ref 0.4–3.1)

## 2023-10-20 LAB — FOLATE: Folate: 11.7 ng/mL (ref >5.9)

## 2023-10-20 LAB — LACTATE DEHYDROGENASE: LDH: 92 U/L — ABNORMAL LOW (ref 98–192)

## 2023-10-20 LAB — VITAMIN B12: Vitamin B-12: 498 pg/mL (ref 180–914)

## 2023-10-20 NOTE — Patient Instructions (Signed)

## 2023-10-21 ENCOUNTER — Telehealth: Payer: Self-pay

## 2023-10-21 DIAGNOSIS — R001 Bradycardia, unspecified: Secondary | ICD-10-CM

## 2023-10-21 LAB — KAPPA/LAMBDA LIGHT CHAINS
Kappa free light chain: 23.6 mg/L — ABNORMAL HIGH (ref 3.3–19.4)
Kappa, lambda light chain ratio: 1.33 (ref 0.26–1.65)
Lambda free light chains: 17.8 mg/L (ref 5.7–26.3)

## 2023-10-21 LAB — HAPTOGLOBIN: Haptoglobin: 221 mg/dL (ref 42–346)

## 2023-10-21 NOTE — Telephone Encounter (Signed)
 Patient is scheduled for PPM-I on 11/19/23 @ 11:30.  Patient will have labs drawn between 6/2 - 6/4, per patient request.   Labs ordered and released.   Procedure instructions sent to patient via MyChart, per patient request.   Pre-cert message sent.

## 2023-10-22 LAB — PROTEIN ELECTROPHORESIS, SERUM
A/G Ratio: 1.1 (ref 0.7–1.7)
Albumin ELP: 3.1 g/dL (ref 2.9–4.4)
Alpha-1-Globulin: 0.3 g/dL (ref 0.0–0.4)
Alpha-2-Globulin: 1 g/dL (ref 0.4–1.0)
Beta Globulin: 1.1 g/dL (ref 0.7–1.3)
Gamma Globulin: 0.4 g/dL (ref 0.4–1.8)
Globulin, Total: 2.8 g/dL (ref 2.2–3.9)
Total Protein ELP: 5.9 g/dL — ABNORMAL LOW (ref 6.0–8.5)

## 2023-10-22 LAB — IMMUNOFIXATION ELECTROPHORESIS
IgA: 189 mg/dL (ref 64–422)
IgG (Immunoglobin G), Serum: 543 mg/dL — ABNORMAL LOW (ref 586–1602)
IgM (Immunoglobulin M), Srm: 16 mg/dL — ABNORMAL LOW (ref 26–217)
Total Protein ELP: 6.1 g/dL (ref 6.0–8.5)

## 2023-10-22 LAB — COPPER, SERUM: Copper: 113 ug/dL (ref 80–158)

## 2023-10-22 LAB — METHYLMALONIC ACID, SERUM: Methylmalonic Acid, Quantitative: 260 nmol/L (ref 0–378)

## 2023-10-28 ENCOUNTER — Ambulatory Visit: Payer: Self-pay | Admitting: Internal Medicine

## 2023-10-28 DIAGNOSIS — I10 Essential (primary) hypertension: Secondary | ICD-10-CM | POA: Diagnosis not present

## 2023-10-28 DIAGNOSIS — I700 Atherosclerosis of aorta: Secondary | ICD-10-CM | POA: Diagnosis not present

## 2023-10-28 DIAGNOSIS — E1165 Type 2 diabetes mellitus with hyperglycemia: Secondary | ICD-10-CM | POA: Diagnosis not present

## 2023-10-28 DIAGNOSIS — R001 Bradycardia, unspecified: Secondary | ICD-10-CM | POA: Diagnosis not present

## 2023-10-28 DIAGNOSIS — K922 Gastrointestinal hemorrhage, unspecified: Secondary | ICD-10-CM | POA: Diagnosis not present

## 2023-10-28 DIAGNOSIS — M47812 Spondylosis without myelopathy or radiculopathy, cervical region: Secondary | ICD-10-CM | POA: Diagnosis not present

## 2023-10-28 DIAGNOSIS — D509 Iron deficiency anemia, unspecified: Secondary | ICD-10-CM | POA: Diagnosis not present

## 2023-10-28 DIAGNOSIS — E538 Deficiency of other specified B group vitamins: Secondary | ICD-10-CM | POA: Diagnosis not present

## 2023-10-29 ENCOUNTER — Other Ambulatory Visit (HOSPITAL_COMMUNITY): Payer: Self-pay | Admitting: Internal Medicine

## 2023-10-29 DIAGNOSIS — R921 Mammographic calcification found on diagnostic imaging of breast: Secondary | ICD-10-CM

## 2023-11-02 ENCOUNTER — Other Ambulatory Visit (HOSPITAL_COMMUNITY): Payer: Self-pay | Admitting: Internal Medicine

## 2023-11-02 DIAGNOSIS — R921 Mammographic calcification found on diagnostic imaging of breast: Secondary | ICD-10-CM

## 2023-11-03 NOTE — Progress Notes (Unsigned)
 [facility] [address], [place] [address]   Clinic Day:  11/05/2023  Referring physician: Kathyleen Parkins, MD  Patient Care Team: Kathyleen Parkins, MD as PCP - General (Internal Medicine) Riley Cheadle Windsor Hatcher, MD as Consulting Physician (Gastroenterology)   ASSESSMENT & PLAN:   Assessment:  1.  Severe iron deficiency anemia: - Recent admission to the hospital on 09/10/2023 with hemoglobin 7.8, s/p 2 units PRBC.  Ferritin was 4, B12 291. - Started iron tablet 3 times a day in February 2025, has not taken it in the last 1 week.  She gets constipated from iron pills. - Denies BRBPR/melena.  Colonoscopy and EGD on 10/16/2023  2.  Small right renal neoplasm: - S/p cryoablation on 02/16/2019, follows with Dr. Velna Ghee  3.  Social/family history: - Lives by herself at home and is independent of ADLs and IADLs.  Non-smoker. - Sister and mother had anemia.  Sister had melanoma.  Another sister had colon cancer and breast cancer.  Maternal aunt had breast cancer.  Father had colon cancer.  Plan:  1.  Severe iron deficiency anemia: - Hematology workup from 10/20/2023 shows normal B12, folate, MMA, copper  and LDH.  Iron saturations are 6% with a ferritin of 39.  Reticulocytes and haptoglobin are both WNL.  Myeloma panel showed elevated kappa free light chains at 23.6 but normal lambda free light chains and light chain ratio.  Immunofixation pattern appears unremarkable.  No evidence of monoclonal protein is present. -Etiology likely secondary to malabsorption due to age and PPI use.  She denies bleeding per rectum.  She does have some internal hemorrhoids and diverticulosis.  Denies any recent diverticulitis flare. -Based on labs, I would recommend IV iron based on insurance approval.  -She may continue iron tablets but would recommend reducing to 2/day.  Take with vitamin C to increase absorption. -Return to clinic in 3 months with labs a few days before and see NP.   Orders  Placed This Encounter  Procedures   Iron and TIBC ([facility] ONLY)    Standing Status:   Future    Expected Date:   02/05/2024    Expiration Date:   11/04/2024   Ferritin    Standing Status:   Future    Expected Date:   02/05/2024    Expiration Date:   11/04/2024   CBC with Differential/Platelet    Standing Status:   Future    Expected Date:   02/05/2024    Expiration Date:   11/04/2024      Aurther Blue, NP   6/5/20259:26 AM  CHIEF COMPLAINT/PURPOSE OF CONSULT:   Diagnosis: Iron deficiency anemia  Current Therapy: IDA  HISTORY OF PRESENT ILLNESS:   Kristine Zamora is a 78 y.o. female presenting to clinic today for f/u fpr IDA.   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   Kristine Zamora was admitted to the hospital from 10/15/23 to 10/16/23 for bradycardia and iron deficiency anemia. She underwent an EGD and colonoscopy as inpatient. EGD findings were normal and the colonoscopy found nonbleeding internal hemorrhoids, pandiverticulosis, and a descending colon polyp. Prior to this hospitalization, she was admitted on 09/10/23 for iron deficiency anemia, requiring 2 units of PRBC's.   She has CT A/P on 09/10/23 that found: Stable post ablated changes within the upper pole of the right kidney. No definite evidence of recurrent or residual disease within the abdomen and  pelvis on this limited, single-phase examination. Moderate descending and sigmoid diverticulosis without evidence of acute diverticulitis. Aortic atherosclerosis.  Today, she states that she is doing well overall. Her appetite level is at 100%. Her energy level is at 100%.  Reports several chronic but stable problems.  She has been having shortness of breath with exertion and an occasional cough.  Has dizziness when she stands and trouble sleeping at times.  Overall feels like she is at baseline.  She denies any bleeding, melena or  hematochezia.  Reports she is taking iron tablets 3 times per day and routinely takes a stool softener.  She has been taking this for years.  She denies any ice pica.  Reports she occasionally has abdominal pain and diarrhea.  Thinks this is related to her diverticulosis.  PAST MEDICAL HISTORY:   Past Medical History: Past Medical History:  Diagnosis Date   Anemia    Arthritis    HANDS AND FEET   Asthma    Back pain    Diabetes mellitus without complication (HCC)    TYPE 2   Dyspnea    with exersion    GERD (gastroesophageal reflux disease)    High cholesterol    Hypertension    Lichen planus atrophicus    PONV (postoperative nausea and vomiting)    Right renal mass     Surgical History: Past Surgical History:  Procedure Laterality Date   BREAST SURGERY  1966   MILK TUBE REMOVED   COLONOSCOPY  2008   Dr. Riley Cheadle: pancolonic diverticula, hyperplastic rectosigmoid polyp   COLONOSCOPY N/A 01/16/2015   Procedure: COLONOSCOPY;  Surgeon: Suzette Espy, MD;  [facility];  Service: Endoscopy;  Laterality: N/A;  0900   COLONOSCOPY N/A 10/16/2023   Procedure: COLONOSCOPY;  Surgeon: Suzette Espy, MD;  [facility];  Service: Endoscopy;  Laterality: N/A;   COLONOSCOPY WITH PROPOFOL  N/A 04/21/2022   Procedure: COLONOSCOPY WITH PROPOFOL ;  Surgeon: Suzette Espy, MD;  [facility];  Service: Endoscopy;  Laterality: N/A;  10:30 AM   ESOPHAGOGASTRODUODENOSCOPY N/A 10/16/2023   Procedure: EGD (ESOPHAGOGASTRODUODENOSCOPY);  Surgeon: Suzette Espy, MD;  [facility];  Service: Endoscopy;  Laterality: N/A;   IR RADIOLOGIST EVAL & MGMT  01/19/2019   IR RADIOLOGIST EVAL & MGMT  03/16/2019   IR RADIOLOGIST EVAL & MGMT  06/15/2019   IR RADIOLOGIST EVAL & MGMT  12/22/2019   IR RADIOLOGIST EVAL & MGMT  12/26/2020   IR RADIOLOGIST EVAL & MGMT  12/26/2021   IR RADIOLOGIST EVAL & MGMT  03/09/2023   POLYPECTOMY  04/21/2022   Procedure: POLYPECTOMY;  Surgeon:  Suzette Espy, MD;  [facility];  Service: Endoscopy;;   RADIOLOGY WITH ANESTHESIA Right 02/16/2019   Procedure: CT CRYOABLATION RIGHT RENAL MASS;  Surgeon: Lucinda Saber, MD;  Location: [facility];  Service: Anesthesiology;  Laterality: Right;   TONSILLECTOMY  1965    Social History: Social History   Socioeconomic History   Marital status: Married    Spouse name: Not on file   Number of children: Not on file   Years of education: Not on file   Highest education level: Not on file  Occupational History   Not on file  Tobacco Use   Smoking status: Former    Current packs/day: 0.00    Types: Cigarettes    Quit date: 12/21/1994    Years since quitting: 28.8   Smokeless tobacco: Never  Vaping Use  Vaping status: Never Used  Substance and Sexual Activity   Alcohol use: No    Alcohol/week: 0.0 standard drinks of alcohol    Comment: quit drinking about 1996   Drug use: No   Sexual activity: Not Currently  Other Topics Concern   Not on file  Social History Narrative   Not on file   Social Drivers of Health   Financial Resource Strain: Not on file  Food Insecurity: No Food Insecurity (10/20/2023)   Hunger Vital Sign    Worried About Running Out of Food in the Last Year: Never true    Ran Out of Food in the Last Year: Never true  Transportation Needs: No Transportation Needs (10/20/2023)   PRAPARE - Administrator, Civil Service (Medical): No    Lack of Transportation (Non-Medical): No  Physical Activity: Not on file  Stress: Not on file  Social Connections: Socially Isolated (10/15/2023)   Social Connection and Isolation Panel [NHANES]    Frequency of Communication with Friends and Family: More than three times a week    Frequency of Social Gatherings with Friends and Family: More than three times a week    Attends Religious Services: Never    Database administrator or Organizations: No    Attends Banker Meetings: Never    Marital  Status: Widowed  Intimate Partner Violence: Not At Risk (10/20/2023)   Humiliation, Afraid, Rape, and Kick questionnaire    Fear of Current or Ex-Partner: No    Emotionally Abused: No    Physically Abused: No    Sexually Abused: No    Family History: Family History  Problem Relation Age of Onset   Alcohol abuse Mother        old age   Colon cancer Father        diagnosed in his early 75s   Breast cancer Daughter     Current Medications:  Current Outpatient Medications:    acetaminophen  (TYLENOL ) 500 MG tablet, Take 1,000 mg by mouth every 6 (six) hours as needed for mild pain (pain score 1-3)., Disp: , Rfl:    aspirin EC 81 MG tablet, Take 81 mg by mouth daily. Swallow whole., Disp: , Rfl:    Cholecalciferol (VITAMIN D3) 50 MCG (2000 UT) TABS, Take 2,000 Units by mouth daily., Disp: , Rfl:    clobetasol  cream (TEMOVATE ) 0.05 %, Apply 1 Application topically 2 (two) times daily., Disp: , Rfl:    FEROSUL 325 (65 Fe) MG tablet, Take 325 mg by mouth 3 (three) times daily., Disp: , Rfl:    lisinopril  (PRINIVIL ,ZESTRIL ) 10 MG tablet, Take 10 mg by mouth in the morning., Disp: , Rfl:    metFORMIN  (GLUCOPHAGE ) 1000 MG tablet, Take 1,000 mg by mouth in the morning and at bedtime., Disp: , Rfl:    pantoprazole  (PROTONIX ) 40 MG tablet, Take 40 mg by mouth 2 (two) times daily., Disp: , Rfl:    pravastatin  (PRAVACHOL ) 20 MG tablet, Take 20 mg by mouth at bedtime. 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 She thinks it was something they sprayed them with. JUST THE PEELS, thinks it was the insecticide    REVIEW OF SYSTEMS:   Review of Systems  Constitutional:  Negative for fatigue.  Respiratory:  Positive for cough and shortness of breath.   Gastrointestinal:  Negative for blood in stool.  Neurological:  Positive for dizziness.  Psychiatric/Behavioral:  Positive for sleep disturbance.       VITALS:   Blood pressure (!) 162/81, pulse 75, temperature 98.2 F (36.8 C), temperature source Oral, resp. rate 20, weight 197 lb 8.5 oz (89.6 kg), SpO2 97%.  Wt Readings from Last 3 Encounters:  11/05/23 197 lb 8.5 oz (89.6 kg)  10/20/23 192 lb 7.4 oz (87.3 kg)  10/19/23 194 lb (88 kg)    Body mass index is 35.89 kg/m.   PHYSICAL EXAM:   Physical Exam Constitutional:      Appearance: Normal appearance.  Cardiovascular:     Rate and Rhythm: Normal rate and regular rhythm.  Pulmonary:     Effort: Pulmonary effort is normal.     Breath sounds: Normal breath sounds.  Abdominal:     General: Bowel sounds are normal.     Palpations: Abdomen is soft.  Musculoskeletal:        General: No swelling. Normal range of motion.  Neurological:     Mental Status: She is alert and oriented to person, place, and time. Mental status is at baseline.     LABS:   CBC    Component Value Date/Time   WBC 8.6 10/19/2023 1436   WBC 6.4 10/16/2023 0419   RBC 4.42 10/20/2023 0836   RBC 4.29 10/16/2023 0419   HGB 12.1 10/19/2023 1436   HCT 38.8 10/19/2023 1436   PLT 227 10/19/2023 1436   MCV 84 10/19/2023 1436   MCH 26.1 (L) 10/19/2023 1436   MCH 25.9 (L) 10/16/2023 0419   MCHC 31.2 (L) 10/19/2023 1436   MCHC 31.7 10/16/2023 0419   RDW 18.5 (H) 10/19/2023 1436   LYMPHSABS 1.5 10/15/2023 1512   MONOABS 0.4 10/15/2023 1512   EOSABS 0.1 10/15/2023 1512   BASOSABS 0.1 10/15/2023 1512    CMP    Component Value Date/Time   NA 138 10/19/2023 1436   K 4.4 10/19/2023 1436   CL 98 10/19/2023 1436   CO2 20 10/19/2023 1436   GLUCOSE 111 (H) 10/19/2023 1436   GLUCOSE 87 10/16/2023 0419   BUN 12 10/19/2023 1436   CREATININE 0.98 10/19/2023 1436   CALCIUM 10.2 10/19/2023 1436   PROT 5.7 (L) 09/12/2023 0528   ALBUMIN 2.9 (L) 09/12/2023 0528   AST 23 09/12/2023 0528   ALT 15 09/12/2023 0528   ALKPHOS 51 09/12/2023 0528   BILITOT 0.7 09/12/2023 0528   GFRNONAA >60 10/16/2023 0419    GFRAA >60 02/17/2019 0313    No results found for: "CEA1", "CEA" / No results found for: "CEA1", "CEA" No results found for: "PSA1" No results found for: "CAN199" No results found for: "CAN125"  Lab Results  Component Value Date   TOTALPROTELP 6.1 10/20/2023   TOTALPROTELP 5.9 (L) 10/20/2023   ALBUMINELP 3.1 10/20/2023   A1GS 0.3 10/20/2023   A2GS 1.0 10/20/2023   BETS 1.1 10/20/2023   GAMS 0.4 10/20/2023   MSPIKE Not Observed 10/20/2023   SPEI Comment 10/20/2023   Lab Results  Component Value Date   TIBC 381 10/20/2023   TIBC 439 09/10/2023   FERRITIN 39 10/20/2023   FERRITIN 4 (L) 09/10/2023   IRONPCTSAT 6 (L) 10/20/2023   IRONPCTSAT 4 (L) 09/10/2023   Lab Results  Component Value Date   LDH 92 (L) 10/20/2023     STUDIES:   No results found.

## 2023-11-04 ENCOUNTER — Ambulatory Visit: Admitting: Cardiology

## 2023-11-05 ENCOUNTER — Inpatient Hospital Stay: Attending: Oncology | Admitting: Oncology

## 2023-11-05 DIAGNOSIS — Z808 Family history of malignant neoplasm of other organs or systems: Secondary | ICD-10-CM | POA: Insufficient documentation

## 2023-11-05 DIAGNOSIS — Z87891 Personal history of nicotine dependence: Secondary | ICD-10-CM | POA: Diagnosis not present

## 2023-11-05 DIAGNOSIS — Z803 Family history of malignant neoplasm of breast: Secondary | ICD-10-CM | POA: Insufficient documentation

## 2023-11-05 DIAGNOSIS — Z85528 Personal history of other malignant neoplasm of kidney: Secondary | ICD-10-CM | POA: Diagnosis not present

## 2023-11-05 DIAGNOSIS — D509 Iron deficiency anemia, unspecified: Secondary | ICD-10-CM | POA: Diagnosis not present

## 2023-11-05 DIAGNOSIS — D508 Other iron deficiency anemias: Secondary | ICD-10-CM

## 2023-11-05 DIAGNOSIS — Z800 Family history of malignant neoplasm of digestive organs: Secondary | ICD-10-CM | POA: Insufficient documentation

## 2023-11-05 DIAGNOSIS — Z79899 Other long term (current) drug therapy: Secondary | ICD-10-CM | POA: Insufficient documentation

## 2023-11-05 DIAGNOSIS — Z832 Family history of diseases of the blood and blood-forming organs and certain disorders involving the immune mechanism: Secondary | ICD-10-CM | POA: Diagnosis not present

## 2023-11-09 DIAGNOSIS — R001 Bradycardia, unspecified: Secondary | ICD-10-CM | POA: Diagnosis not present

## 2023-11-10 ENCOUNTER — Ambulatory Visit: Payer: Self-pay

## 2023-11-10 LAB — CBC
Hematocrit: 36.1 % (ref 34.0–46.6)
Hemoglobin: 11.6 g/dL (ref 11.1–15.9)
MCH: 27 pg (ref 26.6–33.0)
MCHC: 32.1 g/dL (ref 31.5–35.7)
MCV: 84 fL (ref 79–97)
Platelets: 234 10*3/uL (ref 150–450)
RBC: 4.29 x10E6/uL (ref 3.77–5.28)
RDW: 17.8 % — ABNORMAL HIGH (ref 11.7–15.4)
WBC: 7.1 10*3/uL (ref 3.4–10.8)

## 2023-11-10 LAB — BASIC METABOLIC PANEL WITH GFR
BUN/Creatinine Ratio: 12 (ref 12–28)
BUN: 8 mg/dL (ref 8–27)
CO2: 22 mmol/L (ref 20–29)
Calcium: 9.4 mg/dL (ref 8.7–10.3)
Chloride: 100 mmol/L (ref 96–106)
Creatinine, Ser: 0.67 mg/dL (ref 0.57–1.00)
Glucose: 91 mg/dL (ref 70–99)
Potassium: 4.3 mmol/L (ref 3.5–5.2)
Sodium: 140 mmol/L (ref 134–144)
eGFR: 90 mL/min/{1.73_m2} (ref >59)

## 2023-11-11 ENCOUNTER — Telehealth (HOSPITAL_COMMUNITY): Payer: Self-pay

## 2023-11-11 NOTE — Telephone Encounter (Signed)
 Spoke with patient's daughter Bridgette Campus to discuss upcoming procedure/DPR on file.   Confirmed patient is scheduled for a Permanent transvenous pacemaker (PPM) on Thursday, June 19 with Dr. Manya Sells. Instructed patient to arrive at the Main Entrance A at [facility]: [address] [place], [place] [address] and check in at Admitting at 9:30 AM.   Labs completed  Any recent signs of acute illness or been started on antibiotics?  No Any new medications started? No Any medications to hold?  Hold Aspirin for 2 days prior to your procedure- last dose will be June 16.  Medication instructions:  On the morning of your procedure DO NOT take any medication. No eating or drinking after midnight prior to procedure.   The night before your procedure and the morning of your procedure, wash thoroughly with the CHG surgical soap from the neck down, paying special attention to the area where your procedure will be performed.  Advised of plan to go home the same day and will only stay overnight if medically necessary. You MUST have a responsible adult to drive you home and MUST be with you the first 24 hours after you arrive home.  Bridgette Campus verbalized understanding to all instructions provided and agreed to proceed with procedure.

## 2023-11-13 ENCOUNTER — Encounter: Payer: Self-pay | Admitting: Emergency Medicine

## 2023-11-13 DIAGNOSIS — M1991 Primary osteoarthritis, unspecified site: Secondary | ICD-10-CM | POA: Diagnosis not present

## 2023-11-13 DIAGNOSIS — J4531 Mild persistent asthma with (acute) exacerbation: Secondary | ICD-10-CM | POA: Diagnosis not present

## 2023-11-13 DIAGNOSIS — E1165 Type 2 diabetes mellitus with hyperglycemia: Secondary | ICD-10-CM | POA: Diagnosis not present

## 2023-11-13 DIAGNOSIS — I10 Essential (primary) hypertension: Secondary | ICD-10-CM | POA: Diagnosis not present

## 2023-11-13 DIAGNOSIS — J209 Acute bronchitis, unspecified: Secondary | ICD-10-CM | POA: Diagnosis not present

## 2023-11-16 ENCOUNTER — Telehealth: Payer: Self-pay | Admitting: Internal Medicine

## 2023-11-16 NOTE — Telephone Encounter (Signed)
 Pt is scheduled for pacer implant on 6/19 and daughter Bridgette Campus is requesting a cb to discuss pt being sick and on antibiotics and prednisone .

## 2023-11-17 NOTE — Telephone Encounter (Signed)
 Spoke with Kristine Zamora per DPR. Pt has been sick. Cough with possible infection. Started a inhaler and antibiotic last week. Pt is feeling better but does have cough. Will speak with Dr Carolynne Citron and advise on procedure. Kristine Zamora stated understanding.

## 2023-11-18 NOTE — Telephone Encounter (Signed)
 Spoke to [place] per [employer]. Told her Dr Carolynne Citron would like to move procedure back. We decided on July 10th. Bridgette Campus stated understanding.  Cath lab called and date changed.

## 2023-11-19 ENCOUNTER — Telehealth: Payer: Self-pay

## 2023-11-19 NOTE — Telephone Encounter (Signed)
 Spoke with patient to complete pre-procedure call.     New medical conditions?  NO Recent hospitalizations or surgeries? NO  Started any new medications? Yes. Started on 6/13 - 2wk regimen of Augmentin & Albuterol  for a cough. Patient made aware to contact office to inform of any new medications started. Any changes in activities of daily living? NO   Pre-procedure testing scheduled: Lab work was done on 11/19/23, but unfortunately it was just outside the 30 day window and will need to repeat labs. Patient daughter is aware and will bring patient back in sometime next week (6/24 - 6/28)  Confirmed patient is taking aspirin and was advised to stop day before.  Confirmed patient is scheduled for Permanent transvenous pacemaker (PPM) on Thursday, July 10 with Dr. Manya Sells. Instructed patient to arrive at the Main Entrance A at [facility]: [address] [place], [place] [address] and check in at Admitting at 5:30 AM.  Advised of plan to go home the same day and will only stay overnight if medically necessary. You MUST have a responsible adult to drive you home and MUST be with you the first 24 hours after you arrive home or your procedure could be cancelled.  Patient verbalized understanding to information provided and is agreeable to proceed with procedure.

## 2023-11-19 NOTE — Telephone Encounter (Signed)
 Noted

## 2023-11-23 ENCOUNTER — Inpatient Hospital Stay

## 2023-11-23 VITALS — BP 140/68 | HR 79 | Temp 97.2°F | Resp 20

## 2023-11-23 DIAGNOSIS — Z85528 Personal history of other malignant neoplasm of kidney: Secondary | ICD-10-CM | POA: Diagnosis not present

## 2023-11-23 DIAGNOSIS — Z800 Family history of malignant neoplasm of digestive organs: Secondary | ICD-10-CM | POA: Diagnosis not present

## 2023-11-23 DIAGNOSIS — D509 Iron deficiency anemia, unspecified: Secondary | ICD-10-CM | POA: Diagnosis not present

## 2023-11-23 DIAGNOSIS — Z832 Family history of diseases of the blood and blood-forming organs and certain disorders involving the immune mechanism: Secondary | ICD-10-CM | POA: Diagnosis not present

## 2023-11-23 DIAGNOSIS — Z87891 Personal history of nicotine dependence: Secondary | ICD-10-CM | POA: Diagnosis not present

## 2023-11-23 DIAGNOSIS — Z79899 Other long term (current) drug therapy: Secondary | ICD-10-CM | POA: Diagnosis not present

## 2023-11-23 DIAGNOSIS — Z808 Family history of malignant neoplasm of other organs or systems: Secondary | ICD-10-CM | POA: Diagnosis not present

## 2023-11-23 DIAGNOSIS — Z803 Family history of malignant neoplasm of breast: Secondary | ICD-10-CM | POA: Diagnosis not present

## 2023-11-23 MED ORDER — SODIUM CHLORIDE 0.9 % IV SOLN
300.0000 mg | Freq: Once | INTRAVENOUS | Status: AC
  Administered 2023-11-23: 300 mg via INTRAVENOUS
  Filled 2023-11-23: qty 200

## 2023-11-23 MED ORDER — SODIUM CHLORIDE 0.9 % IV SOLN
INTRAVENOUS | Status: DC
Start: 2023-11-23 — End: 2023-11-23

## 2023-11-23 NOTE — Progress Notes (Signed)
 Patient presents today for iron infusion.  Patient is in satisfactory condition with no new complaints voiced.  Vital signs are stable.  We will proceed with infusion per provider orders.    Peripheral IV started with good blood return pre and post infusion.  Venofer 300 mg IV  given today per MD orders. Tolerated infusion without adverse affects. Vital signs stable. No complaints at this time. Discharged from clinic ambulatory in stable condition. Alert and oriented x 3. F/U with [facility] as scheduled.

## 2023-11-23 NOTE — Patient Instructions (Signed)
 [facility] - A DEPT OF [facility]  Discharge Instructions: Thank you for choosing [facility] to provide your oncology and hematology care.  If you have a lab appointment with the Cancer Center - please note that after April 8th, 2024, all labs will be drawn in the cancer center.  You do not have to check in or register with the main entrance as you have in the past but will complete your check-in in the cancer center.  Wear comfortable clothing and clothing appropriate for easy access to any Portacath or PICC line.   We strive to give you quality time with your provider. You may need to reschedule your appointment if you arrive late (15 or more minutes).  Arriving late affects you and other patients whose appointments are after yours.  Also, if you miss three or more appointments without notifying the office, you may be dismissed from the clinic at the provider's discretion.      For prescription refill requests, have your pharmacy contact our office and allow 72 hours for refills to be completed.    Today you received Venofer IV iron infusion.     BELOW ARE SYMPTOMS THAT SHOULD BE REPORTED IMMEDIATELY: *FEVER GREATER THAN 100.4 F (38 C) OR HIGHER *CHILLS OR SWEATING *NAUSEA AND VOMITING THAT IS NOT CONTROLLED WITH YOUR NAUSEA MEDICATION *UNUSUAL SHORTNESS OF BREATH *UNUSUAL BRUISING OR BLEEDING *URINARY PROBLEMS (pain or burning when urinating, or frequent urination) *BOWEL PROBLEMS (unusual diarrhea, constipation, pain near the anus) TENDERNESS IN MOUTH AND THROAT WITH OR WITHOUT PRESENCE OF ULCERS (sore throat, sores in mouth, or a toothache) UNUSUAL RASH, SWELLING OR PAIN  UNUSUAL VAGINAL DISCHARGE OR ITCHING   Items with * indicate a potential emergency and should be followed up as soon as possible or go to the Emergency Department if any problems should occur.  Please show the CHEMOTHERAPY ALERT CARD or IMMUNOTHERAPY ALERT CARD at  check-in to the Emergency Department and triage nurse.  Should you have questions after your visit or need to cancel or reschedule your appointment, please contact [facility]  - A DEPT OF [facility] [phone]  and follow the prompts.  Office hours are 8:00 a.m. to 4:30 p.m. Monday - Friday. Please note that voicemails left after 4:00 p.m. may not be returned until the following business day.  We are closed weekends and major holidays. You have access to a nurse at all times for urgent questions. Please call the main number to the clinic [phone] and follow the prompts.  For any non-urgent questions, you may also contact your provider using MyChart. We now offer e-Visits for anyone 13 and older to request care online for non-urgent symptoms. For details visit [email or web address].   Also download the MyChart app! Go to the app store, search "MyChart", open the app, select [place], and log in with your MyChart username and password.

## 2023-11-25 ENCOUNTER — Other Ambulatory Visit (HOSPITAL_COMMUNITY): Payer: Self-pay | Admitting: Internal Medicine

## 2023-11-25 ENCOUNTER — Ambulatory Visit (HOSPITAL_COMMUNITY)
Admission: RE | Admit: 2023-11-25 | Discharge: 2023-11-25 | Disposition: A | Discharge status: Home / Self Care | Source: Ambulatory Visit | Attending: Internal Medicine | Admitting: Internal Medicine

## 2023-11-25 DIAGNOSIS — I10 Essential (primary) hypertension: Secondary | ICD-10-CM | POA: Diagnosis not present

## 2023-11-25 DIAGNOSIS — R059 Cough, unspecified: Secondary | ICD-10-CM

## 2023-11-25 DIAGNOSIS — E1165 Type 2 diabetes mellitus with hyperglycemia: Secondary | ICD-10-CM | POA: Diagnosis not present

## 2023-11-25 DIAGNOSIS — J4531 Mild persistent asthma with (acute) exacerbation: Secondary | ICD-10-CM | POA: Diagnosis not present

## 2023-11-25 DIAGNOSIS — J9801 Acute bronchospasm: Secondary | ICD-10-CM | POA: Diagnosis not present

## 2023-11-25 DIAGNOSIS — M1991 Primary osteoarthritis, unspecified site: Secondary | ICD-10-CM | POA: Diagnosis not present

## 2023-12-01 ENCOUNTER — Ambulatory Visit (HOSPITAL_COMMUNITY)
Admission: RE | Admit: 2023-12-01 | Discharge: 2023-12-01 | Disposition: A | Discharge status: Home / Self Care | Source: Ambulatory Visit | Attending: Internal Medicine | Admitting: Internal Medicine

## 2023-12-01 ENCOUNTER — Telehealth (HOSPITAL_COMMUNITY): Payer: Self-pay

## 2023-12-01 ENCOUNTER — Encounter (HOSPITAL_COMMUNITY): Payer: Self-pay

## 2023-12-01 DIAGNOSIS — R92323 Mammographic fibroglandular density, bilateral breasts: Secondary | ICD-10-CM | POA: Diagnosis not present

## 2023-12-01 DIAGNOSIS — R921 Mammographic calcification found on diagnostic imaging of breast: Secondary | ICD-10-CM | POA: Insufficient documentation

## 2023-12-01 DIAGNOSIS — R928 Other abnormal and inconclusive findings on diagnostic imaging of breast: Secondary | ICD-10-CM | POA: Diagnosis not present

## 2023-12-01 DIAGNOSIS — Z01812 Encounter for preprocedural laboratory examination: Secondary | ICD-10-CM

## 2023-12-01 DIAGNOSIS — R001 Bradycardia, unspecified: Secondary | ICD-10-CM

## 2023-12-01 NOTE — Telephone Encounter (Signed)
 Received a return call from patient's daughter, Delon to discuss upcoming procedure/ DPR on file.   Confirmed patient is scheduled for a Permanent transvenous pacemaker (PPM) on Thursday, July 10 with Dr. Danelle Birmingham. Instructed patient to arrive at the Main Entrance A at [facility]: [address] [place], [place] [address] and check in at Admitting at 5:30 AM.   Labs to be completed July 2.  Any recent signs of acute illness or been started on antibiotics? Patient was treated for URI 2 weeks ago. Daughter reports patient is feeling much better and symptoms have resolved.  Any medications to hold? Hold ASA for 2 days prior to procedure- last dose on July 7. Medication instructions:  On the morning of your procedure hold all morning medications.  No eating or drinking after midnight prior to procedure.   The night before your procedure and the morning of your procedure, wash thoroughly with the CHG surgical soap from the neck down, paying special attention to the area where your procedure will be performed.  Advised of plan to go home the same day and will only stay overnight if medically necessary. You MUST have a responsible adult to drive you home and MUST be with you the first 24 hours after you arrive home.  Delon verbalized understanding to all instructions provided and agreed to proceed with procedure.

## 2023-12-01 NOTE — Addendum Note (Signed)
 Addended by: Jacquel Mccamish O on: 12/01/2023 03:52 PM   Modules accepted: Orders

## 2023-12-01 NOTE — Telephone Encounter (Signed)
 Attempted to reach patient to discuss upcoming procedure, no answer. Left VM for patient to return call.

## 2023-12-01 NOTE — Addendum Note (Signed)
 Addended by: Zaydin Billey O on: 12/01/2023 04:16 PM   Modules accepted: Orders

## 2023-12-02 ENCOUNTER — Ambulatory Visit (INDEPENDENT_AMBULATORY_CARE_PROVIDER_SITE_OTHER): Admitting: Gastroenterology

## 2023-12-02 ENCOUNTER — Inpatient Hospital Stay: Attending: Hematology

## 2023-12-02 ENCOUNTER — Encounter: Payer: Self-pay | Admitting: Gastroenterology

## 2023-12-02 VITALS — BP 127/46 | HR 50 | Temp 97.7°F | Resp 20

## 2023-12-02 VITALS — BP 151/77 | HR 81 | Temp 97.7°F | Ht 62.0 in | Wt 193.8 lb

## 2023-12-02 DIAGNOSIS — D509 Iron deficiency anemia, unspecified: Secondary | ICD-10-CM | POA: Insufficient documentation

## 2023-12-02 DIAGNOSIS — Z860101 Personal history of adenomatous and serrated colon polyps: Secondary | ICD-10-CM | POA: Diagnosis not present

## 2023-12-02 DIAGNOSIS — Z860100 Personal history of colon polyps, unspecified: Secondary | ICD-10-CM

## 2023-12-02 MED ORDER — SODIUM CHLORIDE 0.9 % IV SOLN
300.0000 mg | Freq: Once | INTRAVENOUS | Status: AC
  Administered 2023-12-02: 300 mg via INTRAVENOUS
  Filled 2023-12-02: qty 200

## 2023-12-02 MED ORDER — SODIUM CHLORIDE 0.9 % IV SOLN: INTRAVENOUS | Status: DC

## 2023-12-02 NOTE — Patient Instructions (Signed)
 [facility] - A DEPT OF [facility]  Discharge Instructions: Thank you for choosing [facility] to provide your oncology and hematology care.  If you have a lab appointment with the Cancer Center - please note that after April 8th, 2024, all labs will be drawn in the cancer center.  You do not have to check in or register with the main entrance as you have in the past but will complete your check-in in the cancer center.  Wear comfortable clothing and clothing appropriate for easy access to any Portacath or PICC line.   We strive to give you quality time with your provider. You may need to reschedule your appointment if you arrive late (15 or more minutes).  Arriving late affects you and other patients whose appointments are after yours.  Also, if you miss three or more appointments without notifying the office, you may be dismissed from the clinic at the provider's discretion.      For prescription refill requests, have your pharmacy contact our office and allow 72 hours for refills to be completed.    Today you received the following iron  infusion: venofer     To help prevent nausea and vomiting after your treatment, we encourage you to take your nausea medication as directed.  BELOW ARE SYMPTOMS THAT SHOULD BE REPORTED IMMEDIATELY: *FEVER GREATER THAN 100.4 F (38 C) OR HIGHER *CHILLS OR SWEATING *NAUSEA AND VOMITING THAT IS NOT CONTROLLED WITH YOUR NAUSEA MEDICATION *UNUSUAL SHORTNESS OF BREATH *UNUSUAL BRUISING OR BLEEDING *URINARY PROBLEMS (pain or burning when urinating, or frequent urination) *BOWEL PROBLEMS (unusual diarrhea, constipation, pain near the anus) TENDERNESS IN MOUTH AND THROAT WITH OR WITHOUT PRESENCE OF ULCERS (sore throat, sores in mouth, or a toothache) UNUSUAL RASH, SWELLING OR PAIN  UNUSUAL VAGINAL DISCHARGE OR ITCHING   Items with * indicate a potential emergency and should be followed up as soon as possible or go to  the Emergency Department if any problems should occur.  Please show the CHEMOTHERAPY ALERT CARD or IMMUNOTHERAPY ALERT CARD at check-in to the Emergency Department and triage nurse.  Should you have questions after your visit or need to cancel or reschedule your appointment, please contact [facility] [place] - A DEPT OF [facility] [phone]  and follow the prompts.  Office hours are 8:00 a.m. to 4:30 p.m. Monday - Friday. Please note that voicemails left after 4:00 p.m. may not be returned until the following business day.  We are closed weekends and major holidays. You have access to a nurse at all times for urgent questions. Please call the main number to the clinic [phone] and follow the prompts.  For any non-urgent questions, you may also contact your provider using MyChart. We now offer e-Visits for anyone 2 and older to request care online for non-urgent symptoms. For details visit [email or web address].   Also download the MyChart app! Go to the app store, search MyChart, open the app, select [place], and log in with your MyChart username and password.

## 2023-12-02 NOTE — Patient Instructions (Signed)
 Colonoscopy to be scheduled once cleared by cardiology. If colonoscopy negative, then final step would be capsule endoscopy to look at small bowel.  Continue to follow with hematology for iron  infusions.

## 2023-12-02 NOTE — Progress Notes (Signed)
 GI Office Note    Referring Provider: Bertell Satterfield, MD Primary Care Physician:  Bertell Satterfield, MD  Primary Gastroenterologist: Ozell Hollingshead, MD   Chief Complaint   Chief Complaint  Patient presents with   Colonoscopy    Here to discuss scheduling of colonoscopy and stated something about a camera going down her throat.     History of Present Illness   Kristine Zamora is a 78 y.o. female presenting today to schedule colonoscopy due to poor bowel prep. Last seen in 09/2023. H/o multiple colonic adenomas, GERD, tendency towards loose stools but improved on benefiber, IDA with admission in 09/2023 year for symptomatic anemia (Hgb 7.8, ferritin 4, stool heme negative. Received 2 units of prbcs, with Hgb 10.5 at discharge.   She presented for EGD/colonoscopy 10/15/23 and found to have suspected new afib with slow ventricular response. She was admitted for evaluation, procedures completed while in patient as outlined but she had a poor bowel prep. 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 Waddell of 12/10/23.   Today: she has been seen by hematology for IDA. Receiving iron  infusions. Has another infusion scheduled for today. She denies overt GI bleeding. Appetite is ok. Denies abdominal pain, dysphagia, vomiting. Stools are dark on oral iron . Has BM every day. She has been on PPI for over 3 years. No NSAIDS/ASA powders.   She reports that she took her entire bowel prep and clear liquid diet as advised prior to her colonoscopy in May. She had been on clear liquids for about four days (given the delay in procedure in setting of water  issues and cardiac issues). She was given Clenpiq split prep.  EGD 10/2023: -normal esophagus, stomach, duodenal bulb and second portion of duodenum  Colonoscopy 10/2023: -prep of colon inadequate -nonbleeding internal hemorrhoids -diverticulosis -one 5mm polyp in  descending colon, hyperplastic -patient may need a small bowel study  Colonoscopy 04/2022: -diverticulosis -four 3-81mm polyp removed from mid descending colon and cecum -three 6-42mm polyps removed from mid descending colon and proximal ascendinc colon -one 9mm polyp in mid rectum removed -nonbleeding internal hemorrhoids -mulitiple tubular adenomas, next colonoscopy in 3 years    Medications   Current Outpatient Medications  Medication Sig Dispense Refill   acetaminophen  (TYLENOL ) 500 MG tablet Take 1,000 mg by mouth every 6 (six) hours as needed for mild pain (pain score 1-3).     albuterol  (PROVENTIL ) 2 MG tablet Take 2 mg by mouth 3 (three) times daily.     aspirin EC 81 MG tablet Take 81 mg by mouth daily. Swallow whole.     Cholecalciferol (VITAMIN D3) 50 MCG (2000 UT) TABS Take 2,000 Units by mouth daily.     doxycycline (VIBRAMYCIN) 100 MG capsule Take 100 mg by mouth 2 (two) times daily.     FEROSUL 325 (65 Fe) MG tablet Take 325 mg by mouth 3 (three) times daily.     lisinopril  (PRINIVIL ,ZESTRIL ) 10 MG tablet Take 10 mg by mouth in the morning.     metFORMIN  (GLUCOPHAGE ) 1000 MG tablet Take 1,000 mg by mouth in the morning and at bedtime.     pantoprazole  (PROTONIX ) 40 MG tablet Take 40 mg by mouth 2 (two) times daily.     pravastatin  (PRAVACHOL ) 20 MG tablet Take 20 mg by mouth at bedtime.      Tiotropium Bromide  Monohydrate (SPIRIVA  RESPIMAT) 2.5 MCG/ACT AERS Inhale 1  puff into the lungs 2 (two) times daily.     No current facility-administered medications for this visit.    Allergies   Allergies as of 12/02/2023 - Review Complete 12/02/2023  Allergen Reaction Noted   Peach [prunus persica] Anaphylaxis 11/08/2013    Past Medical History   Past Medical History:  Diagnosis Date   Anemia    Arthritis    HANDS AND FEET   Asthma    Back pain    Diabetes mellitus without complication (HCC)    TYPE 2   Dyspnea    with exersion    GERD (gastroesophageal reflux  disease)    High cholesterol    Hypertension    Lichen planus atrophicus    PONV (postoperative nausea and vomiting)    Right renal mass     Past Surgical History   Past Surgical History:  Procedure Laterality Date   BREAST SURGERY  1966   MILK TUBE REMOVED   COLONOSCOPY  2008   Dr. Shaaron: pancolonic diverticula, hyperplastic rectosigmoid polyp   COLONOSCOPY N/A 01/16/2015   Procedure: COLONOSCOPY;  Surgeon: Lamar CHRISTELLA Shaaron, MD;  [facility];  Service: Endoscopy;  Laterality: N/A;  0900   COLONOSCOPY N/A 10/16/2023   Procedure: COLONOSCOPY;  Surgeon: Shaaron Lamar CHRISTELLA, MD;  Location: [facility];  Service: Endoscopy;  Laterality: N/A;   COLONOSCOPY WITH PROPOFOL  N/A 04/21/2022   Procedure: COLONOSCOPY WITH PROPOFOL ;  Surgeon: Shaaron Lamar CHRISTELLA, MD;  [facility];  Service: Endoscopy;  Laterality: N/A;  10:30 AM   ESOPHAGOGASTRODUODENOSCOPY N/A 10/16/2023   Procedure: EGD (ESOPHAGOGASTRODUODENOSCOPY);  Surgeon: Shaaron Lamar CHRISTELLA, MD;  [facility];  Service: Endoscopy;  Laterality: N/A;   IR RADIOLOGIST EVAL & MGMT  01/19/2019   IR RADIOLOGIST EVAL & MGMT  03/16/2019   IR RADIOLOGIST EVAL & MGMT  06/15/2019   IR RADIOLOGIST EVAL & MGMT  12/22/2019   IR RADIOLOGIST EVAL & MGMT  12/26/2020   IR RADIOLOGIST EVAL & MGMT  12/26/2021   IR RADIOLOGIST EVAL & MGMT  03/09/2023   POLYPECTOMY  04/21/2022   Procedure: POLYPECTOMY;  Surgeon: Shaaron Lamar CHRISTELLA, MD;  [facility]: [facility];  Service: Endoscopy;;   RADIOLOGY WITH ANESTHESIA Right 02/16/2019   Procedure: CT CRYOABLATION RIGHT RENAL MASS;  Surgeon: Vanice Sharper, MD;  Location: [facility];  Service: Anesthesiology;  Laterality: Right;   TONSILLECTOMY  1965    Past Family History   Family History  Problem Relation Age of Onset   Alcohol abuse Mother        old age   Colon cancer Father        diagnosed in his early 41s   Breast cancer Daughter     Past Social History   Social History   Socioeconomic  History   Marital status: Married    Spouse name: Not on file   Number of children: Not on file   Years of education: Not on file   Highest education level: Not on file  Occupational History   Not on file  Tobacco Use   Smoking status: Former    Current packs/day: 0.00    Types: Cigarettes    Quit date: 12/21/1994    Years since quitting: 28.9   Smokeless tobacco: Never  Vaping Use   Vaping status: Never Used  Substance and Sexual Activity   Alcohol use: No    Alcohol/week: 0.0 standard drinks of alcohol    Comment: quit drinking about 1996  Drug use: No   Sexual activity: Not Currently  Other Topics Concern   Not on file  Social History Narrative   Not on file   Social Drivers of Health   Financial Resource Strain: Not on file  Food Insecurity: No Food Insecurity (10/20/2023)   Hunger Vital Sign    Worried About Running Out of Food in the Last Year: Never true    Ran Out of Food in the Last Year: Never true  Transportation Needs: No Transportation Needs (10/20/2023)   PRAPARE - Administrator, Civil Service (Medical): No    Lack of Transportation (Non-Medical): No  Physical Activity: Not on file  Stress: Not on file  Social Connections: Socially Isolated (10/15/2023)   Social Connection and Isolation Panel    Frequency of Communication with Friends and Family: More than three times a week    Frequency of Social Gatherings with Friends and Family: More than three times a week    Attends Religious Services: Never    Database administrator or Organizations: No    Attends Banker Meetings: Never    Marital Status: Widowed  Intimate Partner Violence: Not At Risk (10/20/2023)   Humiliation, Afraid, Rape, and Kick questionnaire    Fear of Current or Ex-Partner: No    Emotionally Abused: No    Physically Abused: No    Sexually Abused: No    Review of Systems   General: Negative for anorexia, weight loss, fever, chills, fatigue, weakness. Eyes:  Negative for vision changes.  ENT: Negative for hoarseness, difficulty swallowing , nasal congestion. Somewhat hard of hearing CV: Negative for chest pain, angina, palpitations, dyspnea on exertion, peripheral edema. See hpi Respiratory: Negative for dyspnea at rest, dyspnea on exertion, cough, sputum, wheezing.  GI: See history of present illness. GU:  Negative for dysuria, hematuria, urinary incontinence, urinary frequency, nocturnal urination.  MS: Negative for joint pain, low back pain.  Derm: Negative for rash or itching.  Neuro: Negative for weakness, abnormal sensation, seizure, frequent headaches, memory loss,  confusion.  Psych: Negative for anxiety, depression, suicidal ideation, hallucinations.  Endo: Negative for unusual weight change.  Heme: Negative for bruising or bleeding. Allergy: Negative for rash or hives.  Physical Exam   BP (!) 151/77 (BP Location: Right Arm, Patient Position: Sitting, Cuff Size: Normal)   Pulse 81   Temp 97.7 F (36.5 C) (Oral)   Ht 5' 2 (1.575 m)   Wt 193 lb 12.8 oz (87.9 kg)   SpO2 98%   BMI 35.45 kg/m    General: Well-nourished, well-developed in no acute distress. Accompanied by daughter Head: Normocephalic, atraumatic.   Eyes: Conjunctiva pink, no icterus. Mouth: Oropharyngeal mucosa moist and pink  Neck: Supple without thyromegaly, masses, or lymphadenopathy.  Lungs: Clear to auscultation bilaterally.  Heart: Regular rate and rhythm, no murmurs rubs or gallops.  Abdomen: Bowel sounds are normal, nontender, nondistended, no hepatosplenomegaly or masses,  no abdominal bruits or hernia, no rebound or guarding.   Rectal: not performed Extremities: No lower extremity edema. No clubbing or deformities.  Neuro: Alert and oriented x 4 , grossly normal neurologically.  Skin: Warm and dry, no rash or jaundice.   Psych: Alert and cooperative, normal mood and affect.  Labs   Lab Results  Component Value Date   NA 140 11/09/2023   CL 100  11/09/2023   K 4.3 11/09/2023   CO2 22 11/09/2023   BUN 8 11/09/2023   CREATININE 0.67 11/09/2023  EGFR 90 11/09/2023   CALCIUM 9.4 11/09/2023   ALBUMIN 2.9 (L) 09/12/2023   GLUCOSE 91 11/09/2023   Lab Results  Component Value Date   ALT 15 09/12/2023   AST 23 09/12/2023   ALKPHOS 51 09/12/2023   BILITOT 0.7 09/12/2023   Lab Results  Component Value Date   WBC 7.1 11/09/2023   HGB 11.6 11/09/2023   HCT 36.1 11/09/2023   MCV 84 11/09/2023   PLT 234 11/09/2023   Lab Results  Component Value Date   IRON  22 (L) 10/20/2023   TIBC 381 10/20/2023   FERRITIN 39 10/20/2023   Lab Results  Component Value Date   VITAMINB12 498 10/20/2023   Lab Results  Component Value Date   FOLATE 11.7 10/20/2023    Imaging Studies   MM 3D DIAGNOSTIC MAMMOGRAM BILATERAL BREAST Result Date: 12/01/2023 CLINICAL DATA:  78 year old female presents for 1 year follow-up of RIGHT breast calcifications and for annual bilateral mammogram. EXAM: DIGITAL DIAGNOSTIC BILATERAL MAMMOGRAM WITH TOMOSYNTHESIS AND CAD TECHNIQUE: Bilateral digital diagnostic mammography and breast tomosynthesis was performed. The images were evaluated with computer-aided detection. COMPARISON:  Previous exam(s). ACR Breast Density Category b: There are scattered areas of fibroglandular density. FINDINGS: Full field views of both breasts and magnification views of the RIGHT breast demonstrate a stable 0.5 cm group of punctate UPPER-OUTER RIGHT breast calcifications. No new suspicious mammographic findings within either breast noted. IMPRESSION: 1. Stable likely benign UPPER-OUTER RIGHT breast calcifications. One year follow-up recommended to ensure 2 year stability. 2. No new suspicious mammographic findings within either breast. RECOMMENDATION: Bilateral diagnostic mammogram with magnification views of the RIGHT breast in 1 year. I have discussed the findings and recommendations with the patient. If applicable, a reminder letter will  be sent to the patient regarding the next appointment. BI-RADS CATEGORY  3: Probably benign. Electronically Signed   By: Reyes Phi M.D.   On: 12/01/2023 11:38   DG Chest 2 View Result Date: 11/26/2023 CLINICAL DATA:  Cough. EXAM: CHEST - 2 VIEW COMPARISON:  09/10/2023 and older studies. FINDINGS: Cardiac silhouette is normal in size and configuration. Normal mediastinal and hilar contours. Mild chronic scarring in the medial right middle lobe and left upper lobe lingula, stable. Lungs are otherwise clear. No pleural effusion or pneumothorax. Skeletal structures are intact. IMPRESSION: No active cardiopulmonary disease. Electronically Signed   By: Alm Parkins M.D.   On: 11/26/2023 11:07    Assessment/Plan:   IDA/history of adenomatous colon polyps: -EGD completed as outlined and unremarkable -colonoscopy with poor bowel prep, needs modified bowel prep and repeat colonoscopy. Failed Clenpiq before. Will provide 2 day bowel prep with mini miralax  prep and Trilyte . -if colonoscopy is negative, would recommend small bowel capsule study to complete GI work up -we will need to wait for pacemaker placement and cardiac clearance prior to scheduling colonoscopy. Patient and daughter aware.  -ASA 3, room 1,2 ok.   Sonny RAMAN. Ezzard, MHS, PA-C [facility]

## 2023-12-02 NOTE — Progress Notes (Signed)
Venofer iron infusion given per orders. Patient tolerated it well without problems. Vitals stable and discharged home from clinic ambulatory. Follow up as scheduled.

## 2023-12-02 NOTE — Progress Notes (Signed)
   12/02/23 1200  Spiritual Encounters  Type of Visit Initial  Care provided to: Patient  Referral source Other (comment) (Chaplain making rounds)  Reason for visit Routine spiritual support  OnCall Visit No  Spiritual Framework  Presenting Themes Meaning/purpose/sources of inspiration;Values and beliefs;Significant life change;Coping tools  Community/Connection Family  Patient Stress Factors Major life changes;Health changes  Family Stress Factors None identified  Interventions  Spiritual Care Interventions Made Established relationship of care and support;Compassionate presence;Reflective listening;Narrative/life review;Bereavement/grief support;Encouragement;Supported grief process  Intervention Outcomes  Outcomes Connection to spiritual care;Awareness around self/spiritual resourses;Reduced anxiety  Spiritual Care Plan  Spiritual Care Issues Still Outstanding Chaplain will continue to follow   Reason for Visit: Chaplain making rounds on the floor visiting infusion Pts   Description of Visit: Arriving in the room I found Kristine Zamora in a recliner chair receiving treatment.   I introduced myself as the new chaplain for the cancer center and provided education to explain that the role of a chaplain is more than just a religious presence.  She was receptive to my presence, and open to conversation.   Foye shared that her health struggles began shortly after her husband passed away in May 16, 2024 of last year.  There appear to have been some complicated circumstance surrounding his death at the hospital and this has compounded the grieving process for Kristine Zamora.   Kristine Zamora states that she has 3 children who live in the area who help her, and she finds a good deal of comfort in her dog as well.   Kristine Zamora's currently suffering with blood loss that the doctors have not yet been able to identify, and the unknown element appears to be creating anxiety in her.  In future visits I hope to be a support for her in both  her grief and anxiety.   Plan of Care: I mentioned to Kristine Zamora that I would like to check in with her at her next visit and she stated that she would appreciate that.  I will mark my calendar.   Kristine Zamora, MDiv   Chaplain, [facility]  [email or web address] [phone]

## 2023-12-03 DIAGNOSIS — Z01812 Encounter for preprocedural laboratory examination: Secondary | ICD-10-CM | POA: Diagnosis not present

## 2023-12-03 DIAGNOSIS — R001 Bradycardia, unspecified: Secondary | ICD-10-CM | POA: Diagnosis not present

## 2023-12-04 LAB — BASIC METABOLIC PANEL WITH GFR
BUN/Creatinine Ratio: 17 (ref 12–28)
BUN: 15 mg/dL (ref 8–27)
CO2: 23 mmol/L (ref 20–29)
Calcium: 9.7 mg/dL (ref 8.7–10.3)
Chloride: 102 mmol/L (ref 96–106)
Creatinine, Ser: 0.9 mg/dL (ref 0.57–1.00)
Glucose: 90 mg/dL (ref 70–99)
Potassium: 4.9 mmol/L (ref 3.5–5.2)
Sodium: 140 mmol/L (ref 134–144)
eGFR: 66 mL/min/1.73 (ref >59)

## 2023-12-04 LAB — CBC
Hematocrit: 37.1 % (ref 34.0–46.6)
Hemoglobin: 11.7 g/dL (ref 11.1–15.9)
MCH: 28.1 pg (ref 26.6–33.0)
MCHC: 31.5 g/dL (ref 31.5–35.7)
MCV: 89 fL (ref 79–97)
Platelets: 240 x10E3/uL (ref 150–450)
RBC: 4.17 x10E6/uL (ref 3.77–5.28)
RDW: 15.6 % — ABNORMAL HIGH (ref 11.7–15.4)
WBC: 8.1 x10E3/uL (ref 3.4–10.8)

## 2023-12-04 LAB — LAB REPORT - SCANNED: EGFR: 66

## 2023-12-08 ENCOUNTER — Ambulatory Visit

## 2023-12-09 ENCOUNTER — Inpatient Hospital Stay

## 2023-12-09 VITALS — BP 135/64 | HR 69 | Temp 98.2°F | Resp 16

## 2023-12-09 DIAGNOSIS — D509 Iron deficiency anemia, unspecified: Secondary | ICD-10-CM | POA: Diagnosis not present

## 2023-12-09 MED ORDER — SODIUM CHLORIDE 0.9 % IV SOLN: INTRAVENOUS | Status: DC

## 2023-12-09 MED ORDER — SODIUM CHLORIDE 0.9 % IV SOLN
300.0000 mg | Freq: Once | INTRAVENOUS | Status: AC
  Administered 2023-12-09: 300 mg via INTRAVENOUS
  Filled 2023-12-09: qty 10

## 2023-12-09 NOTE — Patient Instructions (Signed)

## 2023-12-09 NOTE — Pre-Procedure Instructions (Signed)
 Maryann Nest, patient's daughter on the following items: Arrival time 0515 Nothing to eat or drink after midnight No meds AM of procedure Responsible person to drive you home and stay with you for 24 hrs Wash with special soap night before and morning of procedure If on anti-coagulant drug instructions ASA- last dose on 7/7

## 2023-12-09 NOTE — Progress Notes (Signed)
 Patient presents today for Venofer  300 mg iron  infusion. Vital signs stable. Patient denies any side effects related to last iron  infusion. MAR reviewed.   Venofer  given today per MD orders. Tolerated infusion without adverse affects. Vital signs stable. No complaints at this time. Discharged from clinic ambulatory in stable condition. Alert and oriented x 3. F/U with [facility] as scheduled.

## 2023-12-10 ENCOUNTER — Encounter (HOSPITAL_COMMUNITY): Payer: Self-pay | Admitting: Internal Medicine

## 2023-12-10 ENCOUNTER — Ambulatory Visit (HOSPITAL_COMMUNITY)

## 2023-12-10 ENCOUNTER — Other Ambulatory Visit: Payer: Self-pay

## 2023-12-10 ENCOUNTER — Ambulatory Visit (HOSPITAL_COMMUNITY)
Admission: RE | Admit: 2023-12-10 | Discharge: 2023-12-10 | Disposition: A | Discharge status: Home / Self Care | Source: Ambulatory Visit | Attending: Internal Medicine | Admitting: Internal Medicine

## 2023-12-10 ENCOUNTER — Encounter (HOSPITAL_COMMUNITY)
Admission: RE | Disposition: A | Discharge status: Home / Self Care | Payer: Self-pay | Source: Ambulatory Visit | Attending: Internal Medicine

## 2023-12-10 DIAGNOSIS — Z950 Presence of cardiac pacemaker: Secondary | ICD-10-CM | POA: Diagnosis not present

## 2023-12-10 DIAGNOSIS — I441 Atrioventricular block, second degree: Secondary | ICD-10-CM | POA: Diagnosis not present

## 2023-12-10 DIAGNOSIS — Z79899 Other long term (current) drug therapy: Secondary | ICD-10-CM | POA: Insufficient documentation

## 2023-12-10 DIAGNOSIS — Z87891 Personal history of nicotine dependence: Secondary | ICD-10-CM | POA: Insufficient documentation

## 2023-12-10 DIAGNOSIS — I495 Sick sinus syndrome: Secondary | ICD-10-CM | POA: Insufficient documentation

## 2023-12-10 DIAGNOSIS — I10 Essential (primary) hypertension: Secondary | ICD-10-CM | POA: Diagnosis not present

## 2023-12-10 HISTORY — PX: PACEMAKER IMPLANT: EP1218

## 2023-12-10 LAB — GLUCOSE, CAPILLARY
Glucose-Capillary: 118 mg/dL — ABNORMAL HIGH (ref 70–99)
Glucose-Capillary: 108 mg/dL — ABNORMAL HIGH (ref 70–99)

## 2023-12-10 SURGERY — PACEMAKER IMPLANT

## 2023-12-10 MED ORDER — SODIUM CHLORIDE 0.9 % IV SOLN
INTRAVENOUS | Status: DC
Start: 2023-12-10 — End: 2023-12-10

## 2023-12-10 MED ORDER — ONDANSETRON HCL 4 MG/2ML IJ SOLN: 4.0000 mg | Freq: Four times a day (QID) | INTRAMUSCULAR | Status: DC | PRN

## 2023-12-10 MED ORDER — POVIDONE-IODINE 10 % EX SWAB
2.0000 | Freq: Once | CUTANEOUS | Status: AC
  Administered 2023-12-10: 2 via TOPICAL

## 2023-12-10 MED ORDER — ACETAMINOPHEN 325 MG PO TABS
325.0000 mg | ORAL_TABLET | ORAL | Status: DC | PRN
  Administered 2023-12-10: 650 mg via ORAL
  Filled 2023-12-10: qty 2

## 2023-12-10 MED ORDER — MIDAZOLAM HCL 2 MG/2ML IJ SOLN
INTRAMUSCULAR | Status: AC
  Filled 2023-12-10: qty 2

## 2023-12-10 MED ORDER — MIDAZOLAM HCL 5 MG/5ML IJ SOLN
INTRAMUSCULAR | Status: DC | PRN
Start: 2023-12-10 — End: 2023-12-10
  Administered 2023-12-10 (×4): 1 mg via INTRAVENOUS

## 2023-12-10 MED ORDER — FENTANYL CITRATE (PF) 100 MCG/2ML IJ SOLN
INTRAMUSCULAR | Status: DC | PRN
  Administered 2023-12-10: 25 ug via INTRAVENOUS
  Administered 2023-12-10 (×2): 12.5 ug via INTRAVENOUS

## 2023-12-10 MED ORDER — CEFAZOLIN SODIUM-DEXTROSE 1-4 GM/50ML-% IV SOLN
1.0000 g | Freq: Once | INTRAVENOUS | Status: AC
  Administered 2023-12-10: 1 g via INTRAVENOUS
  Filled 2023-12-10: qty 50

## 2023-12-10 MED ORDER — SODIUM CHLORIDE 0.9 % IV SOLN: 80.0000 mg | INTRAVENOUS | Status: AC

## 2023-12-10 MED ORDER — SODIUM CHLORIDE 0.9 % IV SOLN
INTRAVENOUS | Status: AC
  Administered 2023-12-10: 80 mg
  Filled 2023-12-10: qty 2

## 2023-12-10 MED ORDER — CEFAZOLIN SODIUM-DEXTROSE 2-4 GM/100ML-% IV SOLN
INTRAVENOUS | Status: AC
  Administered 2023-12-10: 2 g via INTRAVENOUS
  Filled 2023-12-10: qty 100

## 2023-12-10 MED ORDER — HEPARIN (PORCINE) IN NACL 1000-0.9 UT/500ML-% IV SOLN
INTRAVENOUS | Status: DC | PRN
  Administered 2023-12-10: 500 mL

## 2023-12-10 MED ORDER — FENTANYL CITRATE (PF) 100 MCG/2ML IJ SOLN
INTRAMUSCULAR | Status: AC
  Filled 2023-12-10: qty 2

## 2023-12-10 MED ORDER — CHLORHEXIDINE GLUCONATE 4 % EX SOLN: 4.0000 | Freq: Once | CUTANEOUS | Status: DC

## 2023-12-10 MED ORDER — CEFAZOLIN SODIUM-DEXTROSE 2-4 GM/100ML-% IV SOLN: 2.0000 g | INTRAVENOUS | Status: AC

## 2023-12-10 MED ORDER — LIDOCAINE HCL (PF) 1 % IJ SOLN
INTRAMUSCULAR | Status: DC | PRN
  Administered 2023-12-10: 50 mL

## 2023-12-10 MED ORDER — LIDOCAINE HCL (PF) 1 % IJ SOLN
INTRAMUSCULAR | Status: AC
  Filled 2023-12-10: qty 90

## 2023-12-10 SURGICAL SUPPLY — 10 items
CABLE SURGICAL S-101-97-12 (CABLE) ×1 IMPLANT
CATH RIGHTSITE C315HIS02 (CATHETERS) IMPLANT
IPG PACE AZUR XT DR MRI W1DR01 (Pacemaker) IMPLANT
LEAD CAPSURE NOVUS 5076-52CM (Lead) IMPLANT
LEAD SELECT SECURE 3830 383069 (Lead) IMPLANT
PAD DEFIB RADIO PHYSIO CONN (PAD) ×1 IMPLANT
SHEATH 7FR PRELUDE SNAP 13 (SHEATH) IMPLANT
SYR CONTROL 10ML ANGIOGRAPHIC (SYRINGE) IMPLANT
TRAY PACEMAKER INSERTION (PACKS) ×1 IMPLANT
WIRE HI TORQ VERSACORE-J 145CM (WIRE) IMPLANT

## 2023-12-10 NOTE — Discharge Instructions (Signed)
 After Your Pacemaker   You have a Med[employer]onic Pacemaker  ACTIVITY Do not lift your arm above shoulder height for 1 week after your procedure. After 7 days, you may progress as below.  You should remove your sling 24 hours after your procedure, unless otherwise instructed by your provider.     Thursday December 17, 2023  Friday December 18, 2023 Saturday December 19, 2023 Sunday December 20, 2023   Do not lift, push, pull, or carry anything over 10 pounds with the affected arm until 6 weeks (Thursday January 21, 2024 ) after your procedure.   You may drive AFTER your wound check, unless you have been told otherwise by your provider.   Ask your healthcare provider when you can go back to work   INCISION/Dressing If you are on a blood thinner such as Coumadin, Xarelto, Eliquis, Plavix, or Pradaxa please confirm with your provider when this should be resumed.   If large square, outer bandage is left in place, this can be removed after 24 hours from your procedure. Do not remove steri-strips or glue as below.   If a PRESSURE DRESSING (a bulky dressing that usually goes up over your shoulder) was applied or left in place, please follow instructions given by your provider on when to return to have this removed.   Monitor your Pacemaker site for redness, swelling, and drainage. Call the device clinic at [phone] if you experience these symptoms or fever/chills.  If your incision is sealed with Steri-strips or staples, you may shower 7 days after your procedure or when told by your provider. Do not remove the steri-strips or let the shower hit directly on your site. You may wash around your site with soap and water .    If you were discharged in a sling, please do not wear this during the day more than 48 hours after your surgery unless otherwise instructed. This may increase the risk of stiffness and soreness in your shoulder.   Avoid lotions, ointments, or perfumes over your incision until it is  well-healed.  You may use a hot tub or a pool AFTER your wound check appointment if the incision is completely closed.  Pacemaker Alerts:  Some alerts are vibratory and others beep. These are NOT emergencies. Please call our office to let us  know. If this occurs at night or on weekends, it can wait until the next business day. Send a remote transmission.  If your device is capable of reading fluid status (for heart failure), you will be offered monthly monitoring to review this with you.   DEVICE MANAGEMENT Remote monitoring is used to monitor your pacemaker from home. This monitoring is scheduled every 91 days by our office. It allows us  to keep an eye on the functioning of your device to ensure it is working properly. You will routinely see your Electrophysiologist annually (more often if necessary).   You should receive your ID card for your new device in 4-8 weeks. Keep this card with you at all times once received. Consider wearing a medical alert bracelet or necklace.  Your Pacemaker may be MRI compatible. This will be discussed at your next office visit/wound check.  You should avoid contact with strong electric or magnetic fields.   Do not use amateur (ham) radio equipment or electric (arc) welding torches. MP3 player headphones with magnets should not be used. Some devices are safe to use if held at least 12 inches (30 cm) from your Pacemaker. These include power tools, lawn  mowers, and speakers. If you are unsure if something is safe to use, ask your health care provider.  When using your cell phone, hold it to the ear that is on the opposite side from the Pacemaker. Do not leave your cell phone in a pocket over the Pacemaker.  You may safely use electric blankets, heating pads, computers, and microwave ovens.  Call the office right away if: You have chest pain. You feel more short of breath than you have felt before. You feel more light-headed than you have felt before. Your  incision starts to open up.  This information is not intended to replace advice given to you by your health care provider. Make sure you discuss any questions you have with your health care provider.

## 2023-12-10 NOTE — H&P (Signed)
 HPI [name] is referred by the ED for evaluation of symptomatic bradycardia and sinus node dysfunction. She has HTN. She is  a pleasant 78 yo woman with a h/o worsening fatigue and weakness. No h/o frank syncope. 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 She gets dizzy at times. She denies chest pain but does have some dyspnea with exertion. She is on no AV nodal blocking drugs.  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 She thinks it was something they sprayed them with. JUST THE PEELS, thinks it was the insecticide                Current Outpatient Medications  Medication Sig Dispense Refill   acetaminophen  (TYLENOL ) 500 MG tablet Take 1,000 mg by mouth every 6 (six) hours as needed for mild pain (pain score 1-3).       aspirin EC 81 MG tablet Take 81 mg by mouth daily. Swallow whole.       Cholecalciferol (VITAMIN D3) 50 MCG (2000 UT) TABS Take 2,000 Units by mouth daily.       clobetasol  cream (TEMOVATE ) 0.05 % Apply 1 Application topically 2 (two) times daily.       FEROSUL 325 (65 Fe) MG tablet Take 325 mg by mouth 3 (three) times daily.       lisinopril  (PRINIVIL ,ZESTRIL ) 10 MG tablet Take 10 mg by mouth in the morning.       metFORMIN  (GLUCOPHAGE ) 1000 MG tablet Take 1,000 mg by mouth in the morning and at bedtime.       pantoprazole  (PROTONIX ) 40 MG tablet Take 40 mg by mouth 2 (two) times daily.       pravastatin  (PRAVACHOL ) 20 MG tablet Take 20 mg by mouth at bedtime.           No current facility-administered medications for this visit.              Past Medical History:  Diagnosis Date   Anemia     Arthritis      HANDS AND FEET   Asthma     Back pain     Diabetes mellitus without complication (HCC)      TYPE 2   Dyspnea      with exersion    GERD (gastroesophageal reflux disease)     High cholesterol     Hypertension     Lichen planus atrophicus     PONV  (postoperative nausea and vomiting)     Right renal mass            ROS:    All systems reviewed and negative except as noted in the HPI.          Past Surgical History:  Procedure Laterality Date   BREAST SURGERY   1966    MILK TUBE REMOVED   COLONOSCOPY   2008    Dr. Shaaron: pancolonic diverticula, hyperplastic rectosigmoid polyp   COLONOSCOPY N/A 01/16/2015    Procedure: COLONOSCOPY;  Surgeon: Lamar CHRISTELLA Shaaron, MD;  [facility];  Service: Endoscopy;  Laterality: N/A;  0900   COLONOSCOPY WITH PROPOFOL  N/A 04/21/2022    Procedure: COLONOSCOPY WITH PROPOFOL ;  Surgeon: Shaaron Lamar CHRISTELLA, MD;  [facility];  Service: Endoscopy;  Laterality: N/A;  10:30 AM   IR RADIOLOGIST EVAL & MGMT  01/19/2019   IR RADIOLOGIST EVAL & MGMT   03/16/2019   IR RADIOLOGIST EVAL & MGMT   06/15/2019   IR RADIOLOGIST EVAL & MGMT   12/22/2019   IR RADIOLOGIST EVAL & MGMT   12/26/2020   IR RADIOLOGIST EVAL & MGMT   12/26/2021   IR RADIOLOGIST EVAL & MGMT   03/09/2023   POLYPECTOMY   04/21/2022    Procedure: POLYPECTOMY;  Surgeon: Shaaron Lamar HERO, MD;  [facility];  Service: Endoscopy;;   RADIOLOGY WITH ANESTHESIA Right 02/16/2019    Procedure: CT CRYOABLATION RIGHT RENAL MASS;  Surgeon: Vanice Sharper, MD;  [facility];  Service: Anesthesiology;  Laterality: Right;   TONSILLECTOMY   1965                 Family History  Problem Relation Age of Onset   Alcohol abuse Mother          old age   Colon cancer Father          diagnosed in his early 14s   Breast cancer Daughter              Social History         Socioeconomic History   Marital status: Married      Spouse name: Not on file   Number of children: Not on file   Years of education: Not on file   Highest education level: Not on file  Occupational History   Not on file  Tobacco Use   Smoking status: Former      Current packs/day: 0.00      Types: Cigarettes      Quit date: 12/21/1994      Years  since quitting: 28.8   Smokeless tobacco: Never  Vaping Use   Vaping status: Never Used  Substance and Sexual Activity   Alcohol use: No      Alcohol/week: 0.0 standard drinks of alcohol      Comment: quit drinking about 1996   Drug use: No   Sexual activity: Not Currently  Other Topics Concern   Not on file  Social History Narrative   Not on file    Social Drivers of Health        Financial Resource Strain: Not on file  Food Insecurity: No Food Insecurity (10/15/2023)    Hunger Vital Sign     Worried About Running Out of Food in the Last Year: Never true     Ran Out of Food in the Last Year: Never true  Transportation Needs: No Transportation Needs (10/15/2023)    PRAPARE - Therapist, art (Medical): No     Lack of Transportation (Non-Medical): No  Physical Activity: Not on file  Stress: Not on file  Social Connections: Socially Isolated (10/15/2023)    Social Connection and Isolation Panel [NHANES]     Frequency of Communication with Friends and Family: More than three times a week     Frequency of Social Gatherings with Friends and Family: More than three times a week     Attends Religious Services: Never     Database administrator or Organizations: No     Attends Banker Meetings: Never     Marital Status: Widowed  Intimate Partner Violence: Not At Risk (10/15/2023)    Humiliation, Afraid, Rape, and Kick questionnaire     Fear of Current or Ex-Partner: No     Emotionally Abused: No  Physically Abused: No     Sexually Abused: No        BP 124/60   Pulse 64   Ht 5' 2 (1.575 m)   Wt 194 lb (88 kg)   SpO2 98%   BMI 35.48 kg/m    Physical Exam:   Well appearing NAD HEENT: Unremarkable Neck:  No JVD, no thyromegally Lymphatics:  No adenopathy Back:  No CVA tenderness Lungs:  Clear with no wheezes HEART:  IRegular rate rhythm, no murmurs, no rubs, no clicks Abd:  soft, positive bowel sounds, no organomegally, no  rebound, no guarding Ext:  2 plus pulses, no edema, no cyanosis, no clubbing Skin:  No rashes no nodules Neuro:  CN II through XII intact, motor grossly intact   Assess/Plan: 2:1 AV block - She appears to be symptomatic. I have discussed the treatment options with the patient and recommend proceeding with PPM insertion.  HTN - we will be able to use a calcium channel or beta blocker once her PPM is inserted as needed.   Kristine Jamarii Banks,MD

## 2023-12-11 ENCOUNTER — Inpatient Hospital Stay

## 2023-12-14 ENCOUNTER — Telehealth: Payer: Self-pay | Admitting: *Deleted

## 2023-12-14 NOTE — Telephone Encounter (Signed)
 Helping preop today. Patient seen by Dr. Waddell and recently underwent PPM implant 4 days ago (12/10/23). Given the short duration from recent pacer implant, need input from Dr. Waddell re: when safe to proceed with colonoscopy from cardiac standpoint. Dr. Waddell - Please route response to P CV DIV PREOP (the pre-op pool). Thank you.

## 2023-12-14 NOTE — Telephone Encounter (Signed)
  Request for patient to stop medication prior to procedure or is needing cleareance  12/14/23  Kristine Zamora 10/31/1945  What type of surgery is being performed? Colonoscopy  When is surgery scheduled? TBD  What type of clearance is required (medical or pharmacy to hold medication or both? medical  Patient is needing to have cardiac clearance prior to being scheduled for procedure.  Name of physician performing surgery?  Dr.Rourk [facility] at [employer]: [phone] Fax: [phone]  Anethesia type (none, local, MAC, general)? MAC

## 2023-12-16 MED FILL — Midazolam HCl Inj 2 MG/2ML (Base Equivalent): INTRAMUSCULAR | Qty: 4 | Status: AC

## 2023-12-24 ENCOUNTER — Ambulatory Visit: Attending: Cardiology

## 2023-12-24 DIAGNOSIS — R001 Bradycardia, unspecified: Secondary | ICD-10-CM

## 2023-12-24 NOTE — Patient Instructions (Signed)

## 2023-12-29 NOTE — Progress Notes (Signed)
 Normal dual chamber pacemaker wound check. Presenting rhythm: AS/VP. Wound well healed. Routine testing performed. Thresholds, sensing, and impedances consistent with implant measurements and at 3.5V safety margin/auto capture until 3 month visit. No episodes. Reviewed arm restrictions to continue for 6 weeks total post op.  Pt enrolled in remote follow-up.

## 2023-12-31 NOTE — Telephone Encounter (Signed)
 Dr. Waddell,  Please review remarks from Lonell Bring, PA-C and provide recommendation.  Thank you for your help.  Please direct your response to CV DIV preop pool.  Josefa HERO. Ples Trudel NP-C     12/31/2023, 11:58 AM [facility] [address] Office [phone] Fax [phone]

## 2024-01-03 NOTE — Telephone Encounter (Signed)
 Ideally wait 6 weeks. If urgent at least 3 weeks.

## 2024-01-04 NOTE — Telephone Encounter (Signed)
 Preoperative team, patient had PPM implanted on 12/10/2023.  Dr. Waddell would like patient to wait until 6 weeks after implant before proceeding with colonoscopy.  Please contact requesting office and asked them if this procedure is urgent or just a routine screening.  Thank you for your help.  Kristine Zamora. Kristine Stines NP-C     01/04/2024, 8:02 AM [facility] [address] Office [phone] Fax [phone]

## 2024-01-05 NOTE — Telephone Encounter (Signed)
 Left a message with the nurse at [facility] to call back and answer our question about her colonoscopy.

## 2024-01-07 ENCOUNTER — Telehealth: Payer: Self-pay

## 2024-01-07 NOTE — Telephone Encounter (Signed)
 Spoke to Tammy at [facility] and she said the patient's colonoscopy is non-urgent. We can still proceed from a cardiac standpoint for clearance.

## 2024-01-07 NOTE — Telephone Encounter (Signed)
 Spoke with Kristine Zamora and told her that on the encounter form it didn't say urgent procedure, but message has been sent to provider regarding the clearance. Informed her that provider is out of office this week.

## 2024-01-07 NOTE — Telephone Encounter (Signed)
 Spoke to [employer] and she informed me this was non-urgent and we can proceed from a cardiac standpoint.

## 2024-01-07 NOTE — Telephone Encounter (Signed)
 Left Kristine Zamora at [facility] a message about the patient in regards to her colonoscopy.

## 2024-01-11 NOTE — Telephone Encounter (Signed)
 Will route to pre-op APP for review

## 2024-01-11 NOTE — Telephone Encounter (Signed)
 Procedure is non-urgent. Anytime between the next 6-8 weeks should be ok.   She has planned updated labs with hematology already.   Tammy R, let's make sure we keep her on our list to be scheduled, pending cardiology clearance.

## 2024-01-12 ENCOUNTER — Telehealth: Payer: Self-pay

## 2024-01-12 ENCOUNTER — Encounter: Payer: Self-pay | Admitting: Internal Medicine

## 2024-01-12 NOTE — Telephone Encounter (Signed)
 noted

## 2024-01-12 NOTE — Telephone Encounter (Signed)
 Pt scheduled for VV on 01/20/24

## 2024-01-12 NOTE — Telephone Encounter (Signed)
  Patient Consent for Virtual Visit        [address] has provided verbal consent on 01/12/2024 for a virtual visit (video or telephone).   CONSENT FOR VIRTUAL VISIT FOR:  Olena JONETTA Qua  By participating in this virtual visit I agree to the following:  I hereby voluntarily request, consent and authorize [facility] and its employed or contracted physicians, physician assistants, nurse practitioners or other licensed health care professionals (the Practitioner), to provide me with telemedicine health care services (the "Services) as deemed necessary by the treating Practitioner. I acknowledge and consent to receive the Services by the Practitioner via telemedicine. I understand that the telemedicine visit will involve communicating with the Practitioner through live audiovisual communication technology and the disclosure of certain medical information by electronic transmission. I acknowledge that I have been given the opportunity to request an in-person assessment or other available alternative prior to the telemedicine visit and am voluntarily participating in the telemedicine visit.  I understand that I have the right to withhold or withdraw my consent to the use of telemedicine in the course of my care at any time, without affecting my right to future care or treatment, and that the Practitioner or I may terminate the telemedicine visit at any time. I understand that I have the right to inspect all information obtained and/or recorded in the course of the telemedicine visit and may receive copies of available information for a reasonable fee.  I understand that some of the potential risks of receiving the Services via telemedicine include:  Delay or interruption in medical evaluation due to technological equipment failure or disruption; Information transmitted may not be sufficient (e.g. poor resolution of images) to allow for appropriate medical decision making by the Practitioner;  and/or  In rare instances, security protocols could fail, causing a breach of personal health information.  Furthermore, I acknowledge that it is my responsibility to provide information about my medical history, conditions and care that is complete and accurate to the best of my ability. I acknowledge that Practitioner's advice, recommendations, and/or decision may be based on factors not within their control, such as incomplete or inaccurate data provided by me or distortions of diagnostic images or specimens that may result from electronic transmissions. I understand that the practice of medicine is not an exact science and that Practitioner makes no warranties or guarantees regarding treatment outcomes. I acknowledge that a copy of this consent can be made available to me via my patient portal [facility] MyChart), or I can request a printed copy by calling the office of [facility].    I understand that my insurance will be billed for this visit.   I have read or had this consent read to me. I understand the contents of this consent, which adequately explains the benefits and risks of the Services being provided via telemedicine.  I have been provided ample opportunity to ask questions regarding this consent and the Services and have had my questions answered to my satisfaction. I give my informed consent for the services to be provided through the use of telemedicine in my medical care

## 2024-01-12 NOTE — Telephone Encounter (Signed)
   Name: Kristine Zamora  DOB: Oct 02, 1945  MRN: [id number]  Primary Cardiologist: None  Preoperative team, please contact this patient and set up a phone call appointment for further preoperative risk assessment. Please obtain consent and complete medication review. Thank you for your help.  Per Dr. Waddell patient will need to wait 6 weeks post pacemaker implant (procedure was on 12/10/23) before she can undergo colonoscopy.  I confirm that guidance regarding antiplatelet and oral anticoagulation therapy has been completed and, if necessary, noted below. None requested.   I also confirmed the patient resides in the state of [place] . As per [facility] Medical Board telemedicine laws, the patient must reside in the state in which the provider is licensed.  Rieley Khalsa D Skyra Crichlow, NP 01/12/2024, 7:47 AM [facility]

## 2024-01-12 NOTE — Progress Notes (Signed)
 PERIOPERATIVE PRESCRIPTION FOR IMPLANTED CARDIAC DEVICE PROGRAMMING   Patient Information:  Patient: Kristine Zamora  MRN: [id number]  Date of Birth: Nov 19, 1945      Planned Procedure:  Coloscopy  Surgeon:  Dr.Rourk Ray[facility]do [facility] at [facility] Phone: [phone] Fax: [phone] Date of Procedure:  TBD    Device Information:   Clinic EP Physician:   Dr. Danelle [place] Device Type:  Pacemaker Manufacturer and Phone #:  [employer]: [phone] Pacemaker Dependent?:  No Date of Last Device Check:  12/24/2023        Normal Device Function?:  Yes     Electrophysiologist's Recommendations:   Have magnet available. Provide continuous ECG monitoring when magnet is used or reprogramming is to be performed.  Procedure may interfere with device function.  Magnet should be placed over device during procedure.  Per Device Clinic Standing Orders, Almarie ONEIDA Shutter  01/12/2024 8:50 AM

## 2024-01-20 ENCOUNTER — Ambulatory Visit: Attending: Cardiology | Admitting: Emergency Medicine

## 2024-01-20 DIAGNOSIS — Z01810 Encounter for preprocedural cardiovascular examination: Secondary | ICD-10-CM | POA: Diagnosis not present

## 2024-01-20 NOTE — Progress Notes (Signed)
 Virtual Visit via Telephone Note   Because of Kristine Zamora co-morbid illnesses, she is at least at moderate risk for complications without adequate follow up.  This format is felt to be most appropriate for this patient at this time.  Due to technical limitations with video connection (technology), today's appointment will be conducted as an audio only telehealth visit, and Kristine Zamora verbally agreed to proceed in this manner.   All issues noted in this document were discussed and addressed.  No physical exam could be performed with this format.  Evaluation Performed:  Preoperative cardiovascular risk assessment _____________   Date:  01/20/2024   Patient ID:  Kristine Zamora, DOB [birth year], MRN [id number] Patient Location:  Home Provider location:   Office  Primary Care Provider:  Bertell Satterfield, MD Primary Cardiologist:  None  Chief Complaint / Patient Profile   78 y.o. y/o female with a h/o symptomatic bradycardia and sinus node dysfunction s/p PPM, hypertension, T2DM who is pending Colonoscopy on date TBD with [facility] at [facility] and presents today for telephonic preoperative cardiovascular risk assessment.  History of Present Illness    Kristine Zamora is a 78 y.o. female who presents via audio/video conferencing for a telehealth visit today.  Pt was last seen in cardiology clinic on 10/19/2023 by Dr. Waddell.  At that time Kristine Zamora she was experiencing symptomatic bradycardia and underwent pacemaker implant on 12/10/2023.  The patient is now pending procedure as outlined above. Since her last visit, she denies chest pain, shortness of breath, lower extremity edema, fatigue, palpitations, melena, hematuria, hemoptysis, diaphoresis, weakness, presyncope, syncope, orthopnea, and PND.  Today patient is doing well overall.  She is without any acute cardiovascular concerns or complaints at this time.  She tells me she feels much improved since her  pacemaker was placed.  She is no longer symptomatic and denies any lightheadedness, dizziness, syncope or fatigue.  She has resumed mild to moderate physical activity without any exertional anginal symptoms.  She does stay relatively active.  Overall she is able to complete greater than 4 METS.  Past Medical History    Past Medical History:  Diagnosis Date   Anemia    Arthritis    HANDS AND FEET   Asthma    Back pain    Diabetes mellitus without complication (HCC)    TYPE 2   Dyspnea    with exersion    GERD (gastroesophageal reflux disease)    High cholesterol    Hypertension    Lichen planus atrophicus    PONV (postoperative nausea and vomiting)    Right renal mass    Past Surgical History:  Procedure Laterality Date   BREAST SURGERY  1966   MILK TUBE REMOVED   COLONOSCOPY  2008   Dr. Shaaron: pancolonic diverticula, hyperplastic rectosigmoid polyp   COLONOSCOPY N/A 01/16/2015   Procedure: COLONOSCOPY;  Surgeon: Lamar CHRISTELLA Shaaron, MD;  [facility];  Service: Endoscopy;  Laterality: N/A;  0900   COLONOSCOPY N/A 10/16/2023   Procedure: COLONOSCOPY;  Surgeon: Shaaron Lamar CHRISTELLA, MD;  [facility];  Service: Endoscopy;  Laterality: N/A;   COLONOSCOPY WITH PROPOFOL  N/A 04/21/2022   Procedure: COLONOSCOPY WITH PROPOFOL ;  Surgeon: Shaaron Lamar CHRISTELLA, MD;  [facility];  Service: Endoscopy;  Laterality: N/A;  10:30 AM   ESOPHAGOGASTRODUODENOSCOPY N/A 10/16/2023   Procedure: EGD (ESOPHAGOGASTRODUODENOSCOPY);  Surgeon: Shaaron Lamar CHRISTELLA, MD;  [facility];  Service: Endoscopy;  Laterality: N/A;   IR RADIOLOGIST EVAL & MGMT  01/19/2019   IR RADIOLOGIST EVAL & MGMT  03/16/2019   IR RADIOLOGIST EVAL & MGMT  06/15/2019   IR RADIOLOGIST EVAL & MGMT  12/22/2019   IR RADIOLOGIST EVAL & MGMT  12/26/2020   IR RADIOLOGIST EVAL & MGMT  12/26/2021   IR RADIOLOGIST EVAL & MGMT  03/09/2023   PACEMAKER IMPLANT N/A 12/10/2023   Procedure: PACEMAKER IMPLANT;  Surgeon: Waddell Danelle ORN, MD;  [facility];  Service: Cardiovascular;  Laterality: N/A;   POLYPECTOMY  04/21/2022   Procedure: POLYPECTOMY;  Surgeon: Shaaron Lamar HERO, MD;  [facility];  Service: Endoscopy;;   RADIOLOGY WITH ANESTHESIA Right 02/16/2019   Procedure: CT CRYOABLATION RIGHT RENAL MASS;  Surgeon: Vanice Sharper, MD;  Location[facility];  Service: Anesthesiology;  Laterality: Right;   TONSILLECTOMY  1965    Allergies  Allergies  Allergen Reactions   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 She thinks it was something they sprayed them with. JUST THE PEELS, thinks it was the insecticide    Home Medications    Prior to Admission medications   Medication Sig Start Date End Date Taking? Authorizing Provider  acetaminophen  (TYLENOL ) 500 MG tablet Take 1,000 mg by mouth every 6 (six) hours as needed for mild pain (pain score 1-3).    [provider]  albuterol  (PROVENTIL ) 2 MG tablet Take 2 mg by mouth 3 (three) times daily. 11/13/23   [provider]  aspirin EC 81 MG tablet Take 81 mg by mouth daily. Swallow whole.    [provider]  Cholecalciferol (VITAMIN D3) 50 MCG (2000 UT) TABS Take 2,000 Units by mouth daily.    [provider]  doxycycline (VIBRAMYCIN) 100 MG capsule Take 100 mg by mouth 2 (two) times daily. 11/25/23   [provider]  FEROSUL 325 (65 Fe) MG tablet Take 325 mg by mouth 3 (three) times daily. 08/31/23   [provider]  lisinopril  (PRINIVIL ,ZESTRIL ) 10 MG tablet Take 10 mg by mouth in the morning.    [provider]  metFORMIN  (GLUCOPHAGE ) 1000 MG tablet Take 1,000 mg by mouth in the morning and at bedtime. 10/11/18   [provider]  pantoprazole  (PROTONIX ) 40 MG tablet Take 40 mg by mouth 2 (two) times daily. 02/20/22   [provider]  pravastatin  (PRAVACHOL ) 20 MG tablet Take 20 mg by mouth at bedtime.     [provider]   Tiotropium Bromide  Monohydrate (SPIRIVA  RESPIMAT) 2.5 MCG/ACT AERS Inhale 1 puff into the lungs 2 (two) times daily.    [provider]    Physical Exam    Vital Signs:  Kristine Zamora does not have vital signs available for review today.  Given telephonic nature of communication, physical exam is limited. AAOx3. NAD. Normal affect.  Speech and respirations are unlabored.  Accessory Clinical Findings    None  Assessment & Plan    1.  Preoperative Cardiovascular Risk Assessment: According to the Revised Cardiac Risk Index (RCRI), her Perioperative Risk of Major Cardiac Event is (%): 0.4. Her Functional Capacity in METs is: 5.07 according to the Duke Activity Status Index (DASI). Therefore, based on ACC/AHA guidelines, patient would be at acceptable risk for the planned procedure without further cardiovascular testing.   The patient was advised that if she develops new symptoms prior to surgery to contact our office to arrange for a follow-up  visit, and she verbalized understanding.  Per Dr. Waddell patient will need to wait 6 weeks post pacemaker implant (procedure was on 12/10/23) before she can undergo colonoscopy.   A copy of this note will be routed to requesting surgeon.  Time:   Today, I have spent 8 minutes with the patient with telehealth technology discussing medical history, symptoms, and management plan.     Kristine LITTIE Louis, Kristine Zamora  01/20/2024, 10:33 AM

## 2024-01-26 ENCOUNTER — Ambulatory Visit (INDEPENDENT_AMBULATORY_CARE_PROVIDER_SITE_OTHER)

## 2024-01-26 DIAGNOSIS — R001 Bradycardia, unspecified: Secondary | ICD-10-CM

## 2024-01-27 ENCOUNTER — Ambulatory Visit: Payer: Self-pay | Admitting: Internal Medicine

## 2024-01-27 LAB — CUP PACEART REMOTE DEVICE CHECK
Battery Remaining Longevity: 151 mo
Battery Voltage: 3.2 V
Brady Statistic AP VP Percent: 1.63 %
Brady Statistic AP VS Percent: 0.48 %
Brady Statistic AS VP Percent: 61.33 %
Brady Statistic AS VS Percent: 36.55 %
Brady Statistic RA Percent Paced: 2.66 %
Brady Statistic RV Percent Paced: 62.96 %
Date Time Interrogation Session: 20250826053005
Implantable Lead Connection Status: 753985
Implantable Lead Connection Status: 753985
Implantable Lead Implant Date: 20250710
Implantable Lead Implant Date: 20250710
Implantable Lead Location: 753859
Implantable Lead Location: 753860
Implantable Lead Model: 3830
Implantable Lead Model: 5076
Implantable Pulse Generator Implant Date: 20250710
Lead Channel Impedance Value: 342 Ohm
Lead Channel Impedance Value: 361 Ohm
Lead Channel Impedance Value: 532 Ohm
Lead Channel Impedance Value: 551 Ohm
Lead Channel Pacing Threshold Amplitude: 0.5 V
Lead Channel Pacing Threshold Amplitude: 1.125 V
Lead Channel Pacing Threshold Pulse Width: 0.4 ms
Lead Channel Pacing Threshold Pulse Width: 0.4 ms
Lead Channel Sensing Intrinsic Amplitude: 11.5 mV
Lead Channel Sensing Intrinsic Amplitude: 11.5 mV
Lead Channel Sensing Intrinsic Amplitude: 2.25 mV
Lead Channel Sensing Intrinsic Amplitude: 2.25 mV
Lead Channel Setting Pacing Amplitude: 1.5 V
Lead Channel Setting Pacing Amplitude: 2.5 V
Lead Channel Setting Pacing Pulse Width: 0.4 ms
Lead Channel Setting Sensing Sensitivity: 1.2 mV
Zone Setting Status: 755011

## 2024-02-04 ENCOUNTER — Inpatient Hospital Stay: Attending: Hematology

## 2024-02-04 DIAGNOSIS — Z85528 Personal history of other malignant neoplasm of kidney: Secondary | ICD-10-CM | POA: Diagnosis not present

## 2024-02-04 DIAGNOSIS — Z803 Family history of malignant neoplasm of breast: Secondary | ICD-10-CM | POA: Insufficient documentation

## 2024-02-04 DIAGNOSIS — Z950 Presence of cardiac pacemaker: Secondary | ICD-10-CM | POA: Diagnosis not present

## 2024-02-04 DIAGNOSIS — D508 Other iron deficiency anemias: Secondary | ICD-10-CM | POA: Insufficient documentation

## 2024-02-04 DIAGNOSIS — G4700 Insomnia, unspecified: Secondary | ICD-10-CM | POA: Diagnosis not present

## 2024-02-04 DIAGNOSIS — Z832 Family history of diseases of the blood and blood-forming organs and certain disorders involving the immune mechanism: Secondary | ICD-10-CM | POA: Diagnosis not present

## 2024-02-04 DIAGNOSIS — Z808 Family history of malignant neoplasm of other organs or systems: Secondary | ICD-10-CM | POA: Insufficient documentation

## 2024-02-04 DIAGNOSIS — Z800 Family history of malignant neoplasm of digestive organs: Secondary | ICD-10-CM | POA: Insufficient documentation

## 2024-02-04 LAB — CBC WITH DIFFERENTIAL/PLATELET
Abs Immature Granulocytes: 0.03 K/uL (ref 0.00–0.07)
Basophils Absolute: 0 K/uL (ref 0.0–0.1)
Basophils Relative: 1 %
Eosinophils Absolute: 0.2 K/uL (ref 0.0–0.5)
Eosinophils Relative: 3 %
HCT: 37.6 % (ref 36.0–46.0)
Hemoglobin: 12.7 g/dL (ref 12.0–15.0)
Immature Granulocytes: 0 %
Lymphocytes Relative: 29 %
Lymphs Abs: 1.9 K/uL (ref 0.7–4.0)
MCH: 30.4 pg (ref 26.0–34.0)
MCHC: 33.8 g/dL (ref 30.0–36.0)
MCV: 90 fL (ref 80.0–100.0)
Monocytes Absolute: 0.4 K/uL (ref 0.1–1.0)
Monocytes Relative: 6 %
Neutro Abs: 4.1 K/uL (ref 1.7–7.7)
Neutrophils Relative %: 61 %
Platelets: 240 K/uL (ref 150–400)
RBC: 4.18 MIL/uL (ref 3.87–5.11)
RDW: 13.2 % (ref 11.5–15.5)
WBC: 6.7 K/uL (ref 4.0–10.5)
nRBC: 0 % (ref 0.0–0.2)

## 2024-02-04 LAB — IRON AND TIBC
Iron: 110 ug/dL (ref 28–170)
Saturation Ratios: 34 % — ABNORMAL HIGH (ref 10.4–31.8)
TIBC: 327 ug/dL (ref 250–450)
UIBC: 217 ug/dL

## 2024-02-04 LAB — FERRITIN: Ferritin: 97 ng/mL (ref 11–307)

## 2024-02-04 NOTE — Telephone Encounter (Signed)
 Pt had telephone visit on 01/20/24. Please advise. Thank you

## 2024-02-04 NOTE — Telephone Encounter (Signed)
 Ok to schedule per original orders. Two day prep.

## 2024-02-09 ENCOUNTER — Other Ambulatory Visit: Payer: Self-pay | Admitting: *Deleted

## 2024-02-09 ENCOUNTER — Encounter: Payer: Self-pay | Admitting: *Deleted

## 2024-02-09 MED ORDER — PEG 3350-KCL-NA BICARB-NACL 420 G PO SOLR: 4000.0000 mL | Freq: Once | ORAL | 0 refills | Status: AC

## 2024-02-09 NOTE — Telephone Encounter (Signed)
 Pt has been scheduled for 02/26/24. Instructions mailed and prep sent to pharmacy.

## 2024-02-10 ENCOUNTER — Encounter: Payer: Self-pay | Admitting: *Deleted

## 2024-02-10 NOTE — Telephone Encounter (Signed)
 [employer] PA:  Notification or Prior Authorization is not required for the requested services You are not required to submit a notification/prior authorization based on the information provided. If you have general questions about the prior authorization requirements, visit [email or web address] > Clinician Resources > Advance and Admission Notification Requirements. The number above acknowledges your notification. Please write this reference number down for future reference. If you would like to request an organization determination, please call us  at [phone]. Decision ID #: [id number]

## 2024-02-11 ENCOUNTER — Inpatient Hospital Stay (HOSPITAL_BASED_OUTPATIENT_CLINIC_OR_DEPARTMENT_OTHER): Admitting: Oncology

## 2024-02-11 VITALS — BP 135/76 | HR 87 | Temp 97.6°F | Resp 16 | Wt 198.0 lb

## 2024-02-11 DIAGNOSIS — Z803 Family history of malignant neoplasm of breast: Secondary | ICD-10-CM | POA: Diagnosis not present

## 2024-02-11 DIAGNOSIS — D508 Other iron deficiency anemias: Secondary | ICD-10-CM

## 2024-02-11 DIAGNOSIS — Z950 Presence of cardiac pacemaker: Secondary | ICD-10-CM | POA: Diagnosis not present

## 2024-02-11 DIAGNOSIS — Z808 Family history of malignant neoplasm of other organs or systems: Secondary | ICD-10-CM | POA: Diagnosis not present

## 2024-02-11 DIAGNOSIS — G4700 Insomnia, unspecified: Secondary | ICD-10-CM | POA: Diagnosis not present

## 2024-02-11 DIAGNOSIS — Z85528 Personal history of other malignant neoplasm of kidney: Secondary | ICD-10-CM | POA: Diagnosis not present

## 2024-02-11 DIAGNOSIS — Z832 Family history of diseases of the blood and blood-forming organs and certain disorders involving the immune mechanism: Secondary | ICD-10-CM | POA: Diagnosis not present

## 2024-02-11 DIAGNOSIS — Z800 Family history of malignant neoplasm of digestive organs: Secondary | ICD-10-CM | POA: Diagnosis not present

## 2024-02-11 NOTE — Assessment & Plan Note (Addendum)
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  She denies bleeding per rectum.  She does have some internal hemorrhoids and diverticulosis.  Denies any recent diverticulitis flare. - She received 3 doses of 300 mg IV Venofer  on 11/23/2023, 12/02/2023 and 12/09/2023.  Tolerated infusions well. -Repeat lab work from 02/04/2024 shows ferritin of 97, iron  saturation 34% and hemoglobin 12.7. -She may continue iron  tablets but would recommend reducing to every other day.  Take with vitamin C to increase absorption. -She is scheduled for a colonoscopy on 02/26/2024. -Return to clinic in 3 months with labs a few days before and see NP.

## 2024-02-11 NOTE — Progress Notes (Signed)
 [name] OFFICE PROGRESS NOTE  Bertell Satterfield, MD  ASSESSMENT & PLAN:    Assessment & Plan Iron  deficiency anemia secondary to inadequate dietary iron  intake -Etiology likely secondary to malabsorption due to age and PPI use.  She denies bleeding per rectum.  She does have some internal hemorrhoids and diverticulosis.  Denies any recent diverticulitis flare. - She received 3 doses of 300 mg IV Venofer  on 11/23/2023, 12/02/2023 and 12/09/2023.  Tolerated infusions well. -Repeat lab work from 02/04/2024 shows ferritin of 97, iron  saturation 34% and hemoglobin 12.7. -She may continue iron  tablets but would recommend reducing to every other day.  Take with vitamin C to increase absorption. -She is scheduled for a colonoscopy on 02/26/2024. -Return to clinic in 3 months with labs a few days before and see NP.   Other iron  deficiency anemia   No orders of the defined types were placed in this encounter.   INTERVAL HISTORY: Patient returns for follow-up.  She received 3 doses of IV Venofer  on 11/23/2023, 12/02/2023 and 12/09/2023.  Tolerated infusions well.  She is also taking iron  supplements daily.  Reports she stopped her iron  supplements about a week ago in preparation for her colonoscopy.  States they told her her colon was not clean enough at her last attempt so she was concerned the iron  may hinder her prep for her colonoscopy.    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  She no longer is experiencing dizziness, fatigue or weakness.  Reports today feeling great.  Appetites been 100% energy levels are 75%.  Denies any pain.  She has shortness of breath occasionally.  Dizziness has almost completely resolved.  Has occasional insomnia.  We reviewed CBC, iron  panel, ferritin  SUMMARY OF HEMATOLOGIC HISTORY: Oncology History   No history exists.   1.  Severe iron  deficiency anemia: - Recent admission to the hospital on 09/10/2023 with hemoglobin 7.8, s/p 2 units  PRBC.  Ferritin was 4, B12 291. - Started iron  tablet 3 times a day in February 2025, has not taken it in the last 1 week.  She gets constipated from iron  pills. - Denies BRBPR/melena.  Colonoscopy and EGD on 10/16/2023 - Hematology workup from 10/20/2023 shows normal B12, folate, MMA, copper  and LDH.  Iron  saturations are 6% with a ferritin of 39.  Reticulocytes and haptoglobin are both WNL.  Myeloma panel showed elevated kappa free light chains at 23.6 but normal lambda free light chains and light chain ratio.  Immunofixation pattern appears unremarkable.  No evidence of monoclonal protein is present.    2.  Small right renal neoplasm: - S/p cryoablation on 02/16/2019, follows with Dr. Majel   3.  Social/family history: - Lives by herself at home and is independent of ADLs and IADLs.  Non-smoker. - Sister and mother had anemia.  Sister had melanoma.  Another sister had colon cancer and breast cancer.  Maternal aunt had breast cancer.  Father had colon cancer.  CBC    Component Value Date/Time   WBC 6.7 02/04/2024 1332   RBC 4.18 02/04/2024 1332   HGB 12.7 02/04/2024 1332   HGB 11.7 12/03/2023 1118   HCT 37.6 02/04/2024 1332   HCT 37.1 12/03/2023 1118   PLT 240 02/04/2024 1332   PLT 240 12/03/2023 1118   MCV 90.0 02/04/2024 1332   MCV 89 12/03/2023 1118   MCH 30.4 02/04/2024 1332   MCHC 33.8 02/04/2024 1332   RDW 13.2 02/04/2024 1332   RDW  15.6 (H) 12/03/2023 1118   LYMPHSABS 1.9 02/04/2024 1332   MONOABS 0.4 02/04/2024 1332   EOSABS 0.2 02/04/2024 1332   BASOSABS 0.0 02/04/2024 1332       Latest Ref Rng & Units 12/03/2023   11:18 AM 11/09/2023    4:52 PM 10/19/2023    2:36 PM  CMP  Glucose 70 - 99 mg/dL 90  91  888   BUN 8 - 27 mg/dL 15  8  12    Creatinine 0.57 - 1.00 mg/dL 9.09  9.32  9.01   Sodium 134 - 144 mmol/L 140  140  138   Potassium 3.5 - 5.2 mmol/L 4.9  4.3  4.4   Chloride 96 - 106 mmol/L 102  100  98   CO2 20 - 29 mmol/L 23  22  20    Calcium 8.7 - 10.3 mg/dL 9.7   9.4  89.7      Lab Results  Component Value Date   FERRITIN 97 02/04/2024   VITAMINB12 498 10/20/2023    Vitals:   02/11/24 1311  BP: 135/76  Pulse: 87  Resp: 16  Temp: 97.6 F (36.4 C)  SpO2: 100%    Review of System:  Review of Systems  Constitutional:  Positive for malaise/fatigue.  Respiratory:  Positive for shortness of breath.   Psychiatric/Behavioral:  The patient has insomnia.     Physical Exam: Physical Exam Constitutional:      Appearance: Normal appearance.  HENT:     Head: Normocephalic and atraumatic.  Eyes:     Pupils: Pupils are equal, round, and reactive to light.  Cardiovascular:     Rate and Rhythm: Normal rate and regular rhythm.     Heart sounds: Normal heart sounds. No murmur heard. Pulmonary:     Effort: Pulmonary effort is normal.     Breath sounds: Normal breath sounds. No wheezing.  Abdominal:     General: Bowel sounds are normal. There is no distension.     Palpations: Abdomen is soft.     Tenderness: There is no abdominal tenderness.  Musculoskeletal:        General: Normal range of motion.     Cervical back: Normal range of motion.  Skin:    General: Skin is warm and dry.     Findings: No rash.  Neurological:     Mental Status: She is alert and oriented to person, place, and time.     Gait: Gait is intact.  Psychiatric:        Mood and Affect: Mood and affect normal.        Cognition and Memory: Memory normal.        Judgment: Judgment normal.      I spent 25 minutes dedicated to the care of this patient (face-to-face and non-face-to-face) on the date of the encounter to include what is described in the assessment and plan.,  Delon Hope, NP 02/11/2024 2:10 PM

## 2024-02-16 NOTE — Progress Notes (Signed)
 Remote PPM Transmission

## 2024-02-22 ENCOUNTER — Encounter: Admitting: Pulmonary Disease

## 2024-02-22 ENCOUNTER — Encounter (HOSPITAL_COMMUNITY)
Admission: RE | Admit: 2024-02-22 | Discharge: 2024-02-22 | Disposition: A | Discharge status: Home / Self Care | Source: Ambulatory Visit | Attending: Internal Medicine | Admitting: Internal Medicine

## 2024-02-26 ENCOUNTER — Encounter (HOSPITAL_COMMUNITY): Payer: Self-pay | Admitting: Internal Medicine

## 2024-02-26 ENCOUNTER — Ambulatory Visit (HOSPITAL_COMMUNITY)
Admission: RE | Admit: 2024-02-26 | Discharge: 2024-02-26 | Disposition: A | Discharge status: Home / Self Care | Attending: Internal Medicine | Admitting: Internal Medicine

## 2024-02-26 ENCOUNTER — Ambulatory Visit (HOSPITAL_COMMUNITY): Admitting: Certified Registered"

## 2024-02-26 ENCOUNTER — Encounter (HOSPITAL_COMMUNITY)
Admission: RE | Disposition: A | Discharge status: Home / Self Care | Payer: Self-pay | Source: Home / Self Care | Attending: Internal Medicine

## 2024-02-26 DIAGNOSIS — D509 Iron deficiency anemia, unspecified: Secondary | ICD-10-CM | POA: Diagnosis not present

## 2024-02-26 DIAGNOSIS — I10 Essential (primary) hypertension: Secondary | ICD-10-CM | POA: Diagnosis not present

## 2024-02-26 DIAGNOSIS — D120 Benign neoplasm of cecum: Secondary | ICD-10-CM

## 2024-02-26 DIAGNOSIS — K219 Gastro-esophageal reflux disease without esophagitis: Secondary | ICD-10-CM | POA: Insufficient documentation

## 2024-02-26 DIAGNOSIS — E119 Type 2 diabetes mellitus without complications: Secondary | ICD-10-CM | POA: Insufficient documentation

## 2024-02-26 DIAGNOSIS — K5730 Diverticulosis of large intestine without perforation or abscess without bleeding: Secondary | ICD-10-CM | POA: Diagnosis not present

## 2024-02-26 DIAGNOSIS — Z1211 Encounter for screening for malignant neoplasm of colon: Secondary | ICD-10-CM

## 2024-02-26 DIAGNOSIS — Z860100 Personal history of colon polyps, unspecified: Secondary | ICD-10-CM

## 2024-02-26 DIAGNOSIS — Z87891 Personal history of nicotine dependence: Secondary | ICD-10-CM | POA: Diagnosis not present

## 2024-02-26 DIAGNOSIS — Z7984 Long term (current) use of oral hypoglycemic drugs: Secondary | ICD-10-CM | POA: Diagnosis not present

## 2024-02-26 DIAGNOSIS — K635 Polyp of colon: Secondary | ICD-10-CM | POA: Diagnosis not present

## 2024-02-26 HISTORY — PX: COLONOSCOPY: SHX5424

## 2024-02-26 LAB — GLUCOSE, CAPILLARY
Glucose-Capillary: 76 mg/dL (ref 70–99)
Glucose-Capillary: 77 mg/dL (ref 70–99)

## 2024-02-26 SURGERY — COLONOSCOPY
Anesthesia: General

## 2024-02-26 MED ORDER — ONDANSETRON HCL 4 MG/2ML IJ SOLN
INTRAMUSCULAR | Status: DC | PRN
  Administered 2024-02-26: 4 mg via INTRAVENOUS

## 2024-02-26 MED ORDER — LACTATED RINGERS IV SOLN: INTRAVENOUS | Status: DC | PRN

## 2024-02-26 MED ORDER — LIDOCAINE 2% (20 MG/ML) 5 ML SYRINGE
INTRAMUSCULAR | Status: DC | PRN
  Administered 2024-02-26: 60 mg via INTRAVENOUS

## 2024-02-26 MED ORDER — PROPOFOL 10 MG/ML IV BOLUS
INTRAVENOUS | Status: DC | PRN
  Administered 2024-02-26: 125 ug/kg/min via INTRAVENOUS
  Administered 2024-02-26: 70 mg via INTRAVENOUS

## 2024-02-26 MED ORDER — LACTATED RINGERS IV SOLN: INTRAVENOUS | Status: DC

## 2024-02-26 NOTE — H&P (Signed)
 @LOGO @   Gastroenterology Progress Note    Primary Care Physician:  Bertell Satterfield, MD Primary Gastroenterologist:  Dr. Shaaron  Pre-Procedure History & Physical: HPI:  Kristine Zamora is a 78 y.o. female here for diagnostic colonoscopy for iron  deficiency anemia.  Also, history of multiple colonic polyps.  Poor prep earlier this year that ordered a complete colonoscopy.  Past Medical History:  Diagnosis Date   Anemia    Arthritis    HANDS AND FEET   Asthma    Back pain    Diabetes mellitus without complication (HCC)    TYPE 2   Dyspnea    with exersion    GERD (gastroesophageal reflux disease)    High cholesterol    Hypertension    Lichen planus atrophicus    PONV (postoperative nausea and vomiting)    Right renal mass     Past Surgical History:  Procedure Laterality Date   BREAST SURGERY  1966   MILK TUBE REMOVED   COLONOSCOPY  2008   Dr. Shaaron: pancolonic diverticula, hyperplastic rectosigmoid polyp   COLONOSCOPY N/A 01/16/2015   Procedure: COLONOSCOPY;  Surgeon: Lamar CHRISTELLA Shaaron, MD;  Location: [facility];  Service: Endoscopy;  Laterality: N/A;  0900   COLONOSCOPY N/A 10/16/2023   Procedure: COLONOSCOPY;  Surgeon: Shaaron Lamar CHRISTELLA, MD;  Location: [facility];  Service: Endoscopy;  Laterality: N/A;   COLONOSCOPY WITH PROPOFOL  N/A 04/21/2022   Procedure: COLONOSCOPY WITH PROPOFOL ;  Surgeon: Shaaron Lamar CHRISTELLA, MD;  Location: [facility];  Service: Endoscopy;  Laterality: N/A;  10:30 AM   ESOPHAGOGASTRODUODENOSCOPY N/A 10/16/2023   Procedure: EGD (ESOPHAGOGASTRODUODENOSCOPY);  Surgeon: Shaaron Lamar CHRISTELLA, MD;  Location: [facility];  Service: Endoscopy;  Laterality: N/A;   IR RADIOLOGIST EVAL & MGMT  01/19/2019   IR RADIOLOGIST EVAL & MGMT  03/16/2019   IR RADIOLOGIST EVAL & MGMT  06/15/2019   IR RADIOLOGIST EVAL & MGMT  12/22/2019   IR RADIOLOGIST EVAL & MGMT  12/26/2020   IR RADIOLOGIST EVAL & MGMT  12/26/2021   IR RADIOLOGIST EVAL & MGMT  03/09/2023   PACEMAKER IMPLANT N/A  12/10/2023   Procedure: PACEMAKER IMPLANT;  Surgeon: Waddell Danelle ORN, MD;  Location: [facility];  Service: Cardiovascular;  Laterality: N/A;   POLYPECTOMY  04/21/2022   Procedure: POLYPECTOMY;  Surgeon: Shaaron Lamar CHRISTELLA, MD;  Location: [facility];  Service: Endoscopy;;   RADIOLOGY WITH ANESTHESIA Right 02/16/2019   Procedure: CT CRYOABLATION RIGHT RENAL MASS;  Surgeon: Vanice Sharper, MD;  Location: [facility];  Service: Anesthesiology;  Laterality: Right;   TONSILLECTOMY  1965    Prior to Admission medications   Medication Sig Start Date End Date Taking? Authorizing Provider  albuterol  (PROVENTIL ) 2 MG tablet Take 2 mg by mouth 3 (three) times daily. 11/13/23  Yes [provider]  Cholecalciferol (VITAMIN D3) 50 MCG (2000 UT) TABS Take 2,000 Units by mouth daily.   Yes [provider]  doxycycline (VIBRAMYCIN) 100 MG capsule Take 100 mg by mouth 2 (two) times daily. 11/25/23  Yes [provider]  FEROSUL 325 (65 Fe) MG tablet Take 325 mg by mouth 3 (three) times daily. 08/31/23  Yes [provider]  GAVILYTE-G 236 g solution SMARTSIG:Milliliter(s) By Mouth 02/09/24  Yes [provider]  lisinopril  (PRINIVIL ,ZESTRIL ) 10 MG tablet Take 10 mg by mouth in the morning.   Yes [provider]  metFORMIN  (GLUCOPHAGE ) 1000 MG tablet Take 1,000 mg by mouth in the morning and  at bedtime. 10/11/18  Yes [provider]  pantoprazole  (PROTONIX ) 40 MG tablet Take 40 mg by mouth 2 (two) times daily. 02/20/22  Yes [provider]  pravastatin  (PRAVACHOL ) 20 MG tablet Take 20 mg by mouth at bedtime.    Yes [provider]  Tiotropium Bromide  Monohydrate (SPIRIVA  RESPIMAT) 2.5 MCG/ACT AERS Inhale 1 puff into the lungs 2 (two) times daily.   Yes [provider]  acetaminophen  (TYLENOL ) 500 MG tablet Take 1,000 mg by mouth every 6 (six) hours as needed for mild pain (pain score 1-3).    [provider]  aspirin EC 81 MG  tablet Take 81 mg by mouth daily. Swallow whole.    [provider]    Allergies as of 02/09/2024 - Review Complete 01/20/2024  Allergen Reaction Noted   Peach [prunus persica] Anaphylaxis 11/08/2013    Family History  Problem Relation Age of Onset   Alcohol abuse Mother        old age   Colon cancer Father        diagnosed in his early 48s   Breast cancer Daughter     Social History   Socioeconomic History   Marital status: Married    Spouse name: Not on file   Number of children: Not on file   Years of education: Not on file   Highest education level: Not on file  Occupational History   Not on file  Tobacco Use   Smoking status: Former    Current packs/day: 0.00    Types: Cigarettes    Quit date: 12/21/1994    Years since quitting: 29.2   Smokeless tobacco: Never  Vaping Use   Vaping status: Never Used  Substance and Sexual Activity   Alcohol use: No    Alcohol/week: 0.0 standard drinks of alcohol    Comment: quit drinking about 1996   Drug use: No   Sexual activity: Not Currently  Other Topics Concern   Not on file  Social History Narrative   Not on file   Social Drivers of Health   Financial Resource Strain: Not on file  Food Insecurity: No Food Insecurity (10/20/2023)   Hunger Vital Sign    Worried About Running Out of Food in the Last Year: Never true    Ran Out of Food in the Last Year: Never true  Transportation Needs: No Transportation Needs (10/20/2023)   PRAPARE - Administrator, Civil Service (Medical): No    Lack of Transportation (Non-Medical): No  Physical Activity: Not on file  Stress: Not on file  Social Connections: Socially Isolated (10/15/2023)   Social Connection and Isolation Panel    Frequency of Communication with Friends and Family: More than three times a week    Frequency of Social Gatherings with Friends and Family: More than three times a week    Attends Religious Services: Never    Database administrator or  Organizations: No    Attends Banker Meetings: Never    Marital Status: Widowed  Intimate Partner Violence: Not At Risk (10/20/2023)   Humiliation, Afraid, Rape, and Kick questionnaire    Fear of Current or Ex-Partner: No    Emotionally Abused: No    Physically Abused: No    Sexually Abused: No    Review of Systems   See HPI, otherwise negative ROS  Physical Exam: BP 130/70   Pulse 84   Resp 20   Ht 5' 2 (1.575 m)  Wt 89.8 kg   SpO2 99%   BMI 36.21 kg/m  General:   Alert,  Well-developed, well-nourished, pleasant and cooperative in NAD Neck:  Supple; no masses or thyromegaly. No significant cervical adenopathy. Lungs:  Clear throughout to auscultation.   No wheezes, crackles, or rhonchi. No acute distress. Heart:  Regular rate and rhythm; no murmurs, clicks, rubs,  or gallops. Abdomen: Non-distended, normal bowel sounds.  Soft and nontender without appreciable mass or hepatosplenomegaly.     Impression/Plan:   Olena qua here for a surveillance colonoscopy history of multiple colonic polyps.  History of iron  deficiency anemia.  Poor prep earlier this year with ordered a complete colonoscopy.  The risks, benefits, limitations, alternatives and imponderables have been reviewed with the patient. Questions have been answered. All parties are agreeable.      Notice: This dictation was prepared with Dragon dictation along with smaller phrase technology. Any transcriptional errors that result from this process are unintentional and may not be corrected upon review.

## 2024-02-26 NOTE — Transfer of Care (Signed)
 Immediate Anesthesia Transfer of Care Note  Patient: Kristine Zamora  Procedure(s) Performed: COLONOSCOPY  Patient Location: Short Stay  Anesthesia Type:General  Level of Consciousness: awake and patient cooperative  Airway & Oxygen Therapy: Patient Spontanous Breathing  Post-op Assessment: Report given to RN and Post -op Vital signs reviewed and stable  Post vital signs: Reviewed and stable  Last Vitals:  Vitals Value Taken Time  BP 111/48 02/26/24 12:43  Temp 36.6 C 02/26/24 12:43  Pulse 81 02/26/24 12:43  Resp 19 02/26/24 12:43  SpO2 100 % 02/26/24 12:43    Last Pain:  Vitals:   02/26/24 1243  TempSrc: Oral  PainSc: 0-No pain      Patients Stated Pain Goal: 5 (02/26/24 1141)  Complications: No notable events documented.

## 2024-02-26 NOTE — Anesthesia Postprocedure Evaluation (Signed)
 Anesthesia Post Note  Patient: Kristine Zamora  Procedure(s) Performed: COLONOSCOPY  Patient location during evaluation: Phase II Anesthesia Type: General Level of consciousness: awake Pain management: pain level controlled Vital Signs Assessment: post-procedure vital signs reviewed and stable Respiratory status: spontaneous breathing and respiratory function stable Cardiovascular status: blood pressure returned to baseline and stable Postop Assessment: no headache and no apparent nausea or vomiting Anesthetic complications: no Comments: Late entry   No notable events documented.   Last Vitals:  Vitals:   02/26/24 1141 02/26/24 1243  BP: 130/70 (!) 111/48  Pulse: 84 81  Resp: 20 19  Temp:  36.6 C  SpO2: 99% 100%    Last Pain:  Vitals:   02/26/24 1243  TempSrc: Oral  PainSc: 0-No pain                 Yvonna JINNY Bosworth

## 2024-02-26 NOTE — Anesthesia Preprocedure Evaluation (Signed)
 Anesthesia Evaluation  Patient identified by MRN, date of birth, ID band Patient awake    Reviewed: Allergy & Precautions, H&P , NPO status , Patient's Chart, lab work & pertinent test results, reviewed documented beta blocker date and time   History of Anesthesia Complications (+) PONV and history of anesthetic complications  Airway Mallampati: II  TM Distance: >3 FB Neck ROM: full    Dental no notable dental hx.    Pulmonary shortness of breath, asthma , former smoker   Pulmonary exam normal breath sounds clear to auscultation       Cardiovascular Exercise Tolerance: Good hypertension,  Rhythm:regular Rate:Normal     Neuro/Psych negative neurological ROS  negative psych ROS   GI/Hepatic Neg liver ROS,GERD  ,,  Endo/Other  diabetes    Renal/GU Renal disease  negative genitourinary   Musculoskeletal   Abdominal   Peds  Hematology  (+) Blood dyscrasia, anemia   Anesthesia Other Findings   Reproductive/Obstetrics negative OB ROS                              Anesthesia Physical Anesthesia Plan  ASA: 2  Anesthesia Plan: General   Post-op Pain Management:    Induction:   PONV Risk Score and Plan: Propofol  infusion  Airway Management Planned:   Additional Equipment:   Intra-op Plan:   Post-operative Plan:   Informed Consent: I have reviewed the patients History and Physical, chart, labs and discussed the procedure including the risks, benefits and alternatives for the proposed anesthesia with the patient or authorized representative who has indicated his/her understanding and acceptance.     Dental Advisory Given  Plan Discussed with: CRNA  Anesthesia Plan Comments:         Anesthesia Quick Evaluation

## 2024-02-26 NOTE — Discharge Instructions (Addendum)
  Colonoscopy Discharge Instructions  Read the instructions outlined below and refer to this sheet in the next few weeks. These discharge instructions provide you with general information on caring for yourself after you leave the hospital. Your doctor may also give you specific instructions. While your treatment has been planned according to the most current medical practices available, unavoidable complications occasionally occur. If you have any problems or questions after discharge, call Dr. Shaaron at [phone]. ACTIVITY You may resume your regular activity, but move at a slower pace for the next 24 hours.  Take frequent rest periods for the next 24 hours.  Walking will help get rid of the air and reduce the bloated feeling in your belly (abdomen).  No driving for 24 hours (because of the medicine (anesthesia) used during the test).   Do not sign any important legal documents or operate any machinery for 24 hours (because of the anesthesia used during the test).  NUTRITION Drink plenty of fluids.  You may resume your normal diet as instructed by your doctor.  Begin with a light meal and progress to your normal diet. Heavy or fried foods are harder to digest and may make you feel sick to your stomach (nauseated).  Avoid alcoholic beverages for 24 hours or as instructed.  MEDICATIONS You may resume your normal medications unless your doctor tells you otherwise.  WHAT YOU CAN EXPECT TODAY Some feelings of bloating in the abdomen.  Passage of more gas than usual.  Spotting of blood in your stool or on the toilet paper.  IF YOU HAD POLYPS REMOVED DURING THE COLONOSCOPY: No aspirin products for 7 days or as instructed.  No alcohol for 7 days or as instructed.  Eat a soft diet for the next 24 hours.  FINDING OUT THE RESULTS OF YOUR TEST Not all test results are available during your visit. If your test results are not back during the visit, make an appointment with your caregiver to find out the  results. Do not assume everything is normal if you have not heard from your caregiver or the medical facility. It is important for you to follow up on all of your test results.  SEEK IMMEDIATE MEDICAL ATTENTION IF: You have more than a spotting of blood in your stool.  Your belly is swollen (abdominal distention).  You are nauseated or vomiting.  You have a temperature over 101.  You have abdominal pain or discomfort that is severe or gets worse throughout the day.     Diverticulosis found throughout your colon  2 small polyps found and removed  Further recommendations to follow pending review of pathology report  At patient request, I called Avelina Lipoma at [phone] findings and recommendations

## 2024-02-26 NOTE — Op Note (Signed)
 [facility] Patient Name: Kristine Zamora Procedure Date: 02/26/2024 11:20 AM MRN: [id number] Date of Birth: 07/20/1945 Attending MD: Lamar Ozell Hollingshead , MD, [id number] CSN: [id number] Age: 78 Admit Type: Outpatient Procedure:                Colonoscopy Indications:              High risk colon cancer surveillance: Personal                            history of colonic polyps Providers:                Lamar Ozell Hollingshead, MD, Devere Lodge, Bascom Blush Referring MD:              Medicines:                Propofol  per Anesthesia Complications:            No immediate complications. Estimated Blood Loss:     Estimated blood loss was minimal. Procedure:                Pre-Anesthesia Assessment:                           - Prior to the procedure, a History and Physical                            was performed, and patient medications and                            allergies were reviewed. The patient's tolerance of                            previous anesthesia was also reviewed. The risks                            and benefits of the procedure and the sedation                            options and risks were discussed with the patient.                            All questions were answered, and informed consent                            was obtained. Prior Anticoagulants: The patient has                            taken no anticoagulant or antiplatelet agents. ASA                            Grade Assessment: III - A patient with severe                            systemic disease. After reviewing the risks and  benefits, the patient was deemed in satisfactory                            condition to undergo the procedure.                           After obtaining informed consent, the colonoscope                            was passed under direct vision. Throughout the                            procedure, the patient's blood pressure, pulse, and                             oxygen saturations were monitored continuously. The                            [id number] ([id number]) Colon was introduced through                            the anus and advanced to the the cecum, identified                            by appendiceal orifice and ileocecal valve. The                            colonoscopy was performed without difficulty. The                            patient tolerated the procedure well. The quality                            of the bowel preparation was adequate. The                            ileocecal valve, appendiceal orifice, and rectum                            were photographed. The patient tolerated the                            procedure well. Scope In: 12:22:22 PM Scope Out: 12:39:02 PM Scope Withdrawal Time: 0 hours 10 minutes 20 seconds  Total Procedure Duration: 0 hours 16 minutes 40 seconds  Findings:      The perianal and digital rectal examinations were normal.      Multiple medium-mouthed diverticula were found in the entire colon.      Two sessile polyps were found in the cecum. The polyps were 3 to 4 mm in       size. These polyps were removed with a cold snare. Resection and       retrieval were complete. Estimated blood loss was minimal.      The exam was otherwise without abnormality on direct and retroflexion  views. Impression:               - Diverticulosis in the entire examined colon.                           - Two 3 to 4 mm polyps in the cecum, removed with a                            cold snare. Resected and retrieved.                           - The examination was otherwise normal on direct                            and retroflexion views. Moderate Sedation:      Moderate (conscious) sedation was personally administered by an       anesthesia professional. The following parameters were monitored: oxygen       saturation, heart rate, blood pressure, respiratory rate, EKG, adequacy       of  pulmonary ventilation, and response to care. Recommendation:           - Patient has a contact number available for                            emergencies. The signs and symptoms of potential                            delayed complications were discussed with the                            patient. Return to normal activities tomorrow.                            Written discharge instructions were provided to the                            patient.                           - Advance diet as tolerated.                           - Continue present medications.                           - Repeat colonoscopy date to be determined after                            pending pathology results are reviewed for                            surveillance.                           - Return to GI office (date not yet determined). Procedure Code(s):        --- Professional ---  54614, Colonoscopy, flexible; with removal of                            tumor(s), polyp(s), or other lesion(s) by snare                            technique Diagnosis Code(s):        --- Professional ---                           Z86.010, Personal history of colonic polyps                           D12.0, Benign neoplasm of cecum                           K57.30, Diverticulosis of large intestine without                            perforation or abscess without bleeding CPT copyright 2022 American [employer] Association. All rights reserved. The codes documented in this report are preliminary and upon coder review may  be revised to meet current compliance requirements. Lamar HERO. Berdell Hostetler, MD Lamar Ozell Hollingshead, MD 02/26/2024 12:47:58 PM This report has been signed electronically. Number of Addenda: 0

## 2024-02-29 ENCOUNTER — Encounter (HOSPITAL_COMMUNITY): Payer: Self-pay | Admitting: Internal Medicine

## 2024-02-29 LAB — SURGICAL PATHOLOGY

## 2024-03-02 ENCOUNTER — Ambulatory Visit: Payer: Self-pay | Admitting: Internal Medicine

## 2024-03-08 ENCOUNTER — Other Ambulatory Visit: Payer: Self-pay | Admitting: Interventional Radiology

## 2024-03-08 DIAGNOSIS — N2889 Other specified disorders of kidney and ureter: Secondary | ICD-10-CM

## 2024-03-22 ENCOUNTER — Ambulatory Visit
Admission: RE | Admit: 2024-03-22 | Discharge: 2024-03-22 | Disposition: A | Discharge status: Home / Self Care | Source: Ambulatory Visit | Attending: Interventional Radiology | Admitting: Interventional Radiology

## 2024-03-22 DIAGNOSIS — N2889 Other specified disorders of kidney and ureter: Secondary | ICD-10-CM

## 2024-03-22 MED ORDER — IOPAMIDOL (ISOVUE-300) INJECTION 61%
100.0000 mL | Freq: Once | INTRAVENOUS | Status: AC | PRN
  Administered 2024-03-22: 100 mL via INTRAVENOUS

## 2024-03-23 ENCOUNTER — Encounter: Payer: Self-pay | Admitting: Internal Medicine

## 2024-03-23 ENCOUNTER — Ambulatory Visit: Attending: Cardiology | Admitting: Internal Medicine

## 2024-03-23 DIAGNOSIS — I441 Atrioventricular block, second degree: Secondary | ICD-10-CM | POA: Diagnosis not present

## 2024-03-23 NOTE — Patient Instructions (Signed)

## 2024-03-23 NOTE — Progress Notes (Signed)
 HPI 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 She has HTN. She is a pleasant 78 yo woman with a h/o worsening fatigue and weakness. No h/o frank syncope. 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 She gets dizzy at times. She denies chest pain but does have some dyspnea with exertion.  She underwent insertion of a Medtronic DDD PM about 3 months ago.  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 She thinks it was something they sprayed them with. JUST THE PEELS, thinks it was the insecticide     Current Outpatient Medications  Medication Sig Dispense Refill   acetaminophen  (TYLENOL ) 500 MG tablet Take 1,000 mg by mouth every 6 (six) hours as needed for mild pain (pain score 1-3).     albuterol  (PROVENTIL ) 2 MG tablet Take 2 mg by mouth 3 (three) times daily.     aspirin EC 81 MG tablet Take 81 mg by mouth daily. Swallow whole.     Cholecalciferol (VITAMIN D3) 50 MCG (2000 UT) TABS Take 2,000 Units by mouth daily.     doxycycline (VIBRAMYCIN) 100 MG capsule Take 100 mg by mouth 2 (two) times daily.     FEROSUL 325 (65 Fe) MG tablet Take 325 mg by mouth 3 (three) times daily.     GAVILYTE-G 236 g solution SMARTSIG:Milliliter(s) By Mouth     lisinopril  (PRINIVIL ,ZESTRIL ) 10 MG tablet Take 10 mg by mouth in the morning.     metFORMIN  (GLUCOPHAGE ) 1000 MG tablet Take 1,000 mg by mouth in the morning and at bedtime.     pantoprazole  (PROTONIX ) 40 MG tablet Take 40 mg by mouth 2 (two) times daily.     pravastatin  (PRAVACHOL ) 20 MG tablet Take 20 mg by mouth at bedtime.      Tiotropium Bromide  Monohydrate (SPIRIVA  RESPIMAT) 2.5 MCG/ACT AERS Inhale 1 puff into the lungs 2 (two) times daily.     No current facility-administered medications for this visit.     Past Medical History:  Diagnosis Date   Anemia    Arthritis    HANDS AND FEET   Asthma    Back pain    Diabetes  mellitus without complication (HCC)    TYPE 2   Dyspnea    with exersion    GERD (gastroesophageal reflux disease)    High cholesterol    Hypertension    Lichen planus atrophicus    PONV (postoperative nausea and vomiting)    Right renal mass     ROS:   All systems reviewed and negative except as noted in the HPI.   Past Surgical History:  Procedure Laterality Date   BREAST SURGERY  1966   MILK TUBE REMOVED   COLONOSCOPY  2008   Dr. Shaaron: pancolonic diverticula, hyperplastic rectosigmoid polyp   COLONOSCOPY N/A 01/16/2015   Procedure: COLONOSCOPY;  Surgeon: Lamar CHRISTELLA Shaaron, MD;  Location: [facility];  Service: Endoscopy;  Laterality: N/A;  0900   COLONOSCOPY N/A 10/16/2023   Procedure: COLONOSCOPY;  Surgeon: Shaaron Lamar CHRISTELLA, MD;  Location: [facility];  Service: Endoscopy;  Laterality: N/A;   COLONOSCOPY N/A 02/26/2024   Procedure: COLONOSCOPY;  Surgeon: Shaaron Lamar CHRISTELLA, MD;  Location: [facility];  Service: Endoscopy;  Laterality: N/A;  1:00 pm, asa 3   COLONOSCOPY WITH PROPOFOL  N/A 04/21/2022   Procedure: COLONOSCOPY WITH PROPOFOL ;  Surgeon: Shaaron Lamar CHRISTELLA, MD;  Location: [facility];  Service: Endoscopy;  Laterality: N/A;  10:30 AM   ESOPHAGOGASTRODUODENOSCOPY N/A 10/16/2023   Procedure: EGD (ESOPHAGOGASTRODUODENOSCOPY);  Surgeon: Shaaron Lamar HERO, MD;  Location: [facility];  Service: Endoscopy;  Laterality: N/A;   IR RADIOLOGIST EVAL & MGMT  01/19/2019   IR RADIOLOGIST EVAL & MGMT  03/16/2019   IR RADIOLOGIST EVAL & MGMT  06/15/2019   IR RADIOLOGIST EVAL & MGMT  12/22/2019   IR RADIOLOGIST EVAL & MGMT  12/26/2020   IR RADIOLOGIST EVAL & MGMT  12/26/2021   IR RADIOLOGIST EVAL & MGMT  03/09/2023   PACEMAKER IMPLANT N/A 12/10/2023   Procedure: PACEMAKER IMPLANT;  Surgeon: Waddell Kristine ORN, MD;  Location: [facility];  Service: Cardiovascular;  Laterality: N/A;   POLYPECTOMY  04/21/2022   Procedure: POLYPECTOMY;  Surgeon: Shaaron Lamar HERO, MD;  Location: [facility];  Service: Endoscopy;;   RADIOLOGY WITH ANESTHESIA Right 02/16/2019   Procedure: CT CRYOABLATION RIGHT RENAL MASS;  Surgeon: Vanice Sharper, MD;  Location: [facility];  Service: Anesthesiology;  Laterality: Right;   TONSILLECTOMY  1965     Family History  Problem Relation Age of Onset   Alcohol abuse Mother        old age   Colon cancer Father        diagnosed in his early 27s   Breast cancer Daughter      Social History   Socioeconomic History   Marital status: Married    Spouse name: Not on file   Number of children: Not on file   Years of education: Not on file   Highest education level: Not on file  Occupational History   Not on file  Tobacco Use   Smoking status: Former    Current packs/day: 0.00    Types: Cigarettes    Quit date: 12/21/1994    Years since quitting: 29.2   Smokeless tobacco: Never  Vaping Use   Vaping status: Never Used  Substance and Sexual Activity   Alcohol use: No    Alcohol/week: 0.0 standard drinks of alcohol    Comment: quit drinking about 1996   Drug use: No   Sexual activity: Not Currently  Other Topics Concern   Not on file  Social History Narrative   Not on file   Social Drivers of Health   Financial Resource Strain: Not on file  Food Insecurity: No Food Insecurity (10/20/2023)   Hunger Vital Sign    Worried About Running Out of Food in the Last Year: Never true    Ran Out of Food in the Last Year: Never true  Transportation Needs: No Transportation Needs (10/20/2023)   PRAPARE - Administrator, Civil Service (Medical): No    Lack of Transportation (Non-Medical): No  Physical Activity: Not on file  Stress: Not on file  Social Connections: Socially Isolated (10/15/2023)   Social Connection and Isolation Panel    Frequency of Communication with Friends and Family: More than three times a week    Frequency of Social Gatherings with Friends and Family: More than three times a week    Attends Religious Services: Never     Database administrator or Organizations: No    Attends Banker Meetings: Never    Marital Status: Widowed  Intimate Partner Violence: Not At Risk (10/20/2023)   Humiliation, Afraid, Rape, and Kick questionnaire    Fear of Current or Ex-Partner: No    Emotionally Abused: No  Physically Abused: No    Sexually Abused: No     Wt. - 148 lbs., BP 148/69, P - 80 Physical Exam:  Well appearing 78 yo woman,  NAD HEENT: Unremarkable Neck:  No JVD, no thyromegally Lymphatics:  No adenopathy Back:  No CVA tenderness Lungs:  Clear with no wheezes HEART:  Regular rate rhythm, no murmurs, no rubs, no clicks Abd:  soft, positive bowel sounds, no organomegally, no rebound, no guarding Ext:  2 plus pulses, no edema, no cyanosis, no clubbing Skin:  No rashes no nodules Neuro:  CN II through XII intact, motor grossly intact   DEVICE  Normal device function.  See PaceArt for details.   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  PPM - her medtronic DDD PM is working normally. We will recheck in several months. HTN - Consider adding a beta blocker if her bp is high on followup.  Kristine Kaydance Bowie,MD

## 2024-03-29 ENCOUNTER — Telehealth

## 2024-03-29 ENCOUNTER — Inpatient Hospital Stay
Admission: RE | Admit: 2024-03-29 | Discharge: 2024-03-29 | Attending: Interventional Radiology | Admitting: Interventional Radiology

## 2024-03-29 DIAGNOSIS — N2889 Other specified disorders of kidney and ureter: Secondary | ICD-10-CM

## 2024-03-29 HISTORY — PX: IR RADIOLOGIST EVAL & MGMT: IMG5224

## 2024-03-29 NOTE — Progress Notes (Signed)
 Patient ID: Kristine Zamora, female   DOB: [birth year], 78 y.o.   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 Sherrilee  History of Present Illness: Kristine Zamora is a 78 y.o. 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  Lesion was under close surveillance in 2020 and demonstrated slight enlargement compatible with a small right renal neoplasm.  She underwent successful CT-guided cryoablation it was a long hospital 02/16/2019.  Overall she continues to do very well.  She is asymptomatic.  No flank or abdominal pain.  No recent illness or fever.  No urinary tract symptoms, dysuria or hematuria.  Interval surveillance imaging performed demonstrating a stable ablation defect with residual scarring in the anterior right kidney upper pole.  No signs of abnormal enhancement, residual or recurrent disease.  There are several renal cysts noted bilaterally.  At least 1 new cyst measuring 8 mm in the left kidney lower pole noted, too small to further characterize.  No other suspicious renal abnormality or acute process.  Past Medical History:  Diagnosis Date   Anemia    Arthritis    HANDS AND FEET   Asthma    Back pain    Diabetes mellitus without complication (HCC)    TYPE 2   Dyspnea    with exersion    GERD (gastroesophageal reflux disease)    High cholesterol    Hypertension    Lichen planus atrophicus    PONV (postoperative nausea and vomiting)    Right renal mass     Past Surgical History:  Procedure Laterality Date   BREAST SURGERY  1966   MILK TUBE REMOVED   COLONOSCOPY  2008   Dr. Shaaron: pancolonic diverticula, hyperplastic rectosigmoid polyp   COLONOSCOPY N/A 01/16/2015   Procedure: COLONOSCOPY;  Surgeon: Lamar CHRISTELLA Shaaron, MD;  [facility]: [facility];  Service: Endoscopy;  Laterality: N/A;  0900   COLONOSCOPY N/A 10/16/2023   Procedure:  COLONOSCOPY;  Surgeon: Shaaron Lamar CHRISTELLA, MD;  Location: [facility];  Service: Endoscopy;  Laterality: N/A;   COLONOSCOPY N/A 02/26/2024   Procedure: COLONOSCOPY;  Surgeon: Shaaron Lamar CHRISTELLA, MD;  Location: [facility];  Service: Endoscopy;  Laterality: N/A;  1:00 pm, asa 3   COLONOSCOPY WITH PROPOFOL  N/A 04/21/2022   Procedure: COLONOSCOPY WITH PROPOFOL ;  Surgeon: Shaaron Lamar CHRISTELLA, MD;  Location: [facility];  Service: Endoscopy;  Laterality: N/A;  10:30 AM   ESOPHAGOGASTRODUODENOSCOPY N/A 10/16/2023   Procedure: EGD (ESOPHAGOGASTRODUODENOSCOPY);  Surgeon: Shaaron Lamar CHRISTELLA, MD;  Location: [facility];  Service: Endoscopy;  Laterality: N/A;   IR RADIOLOGIST EVAL & MGMT  01/19/2019   IR RADIOLOGIST EVAL & MGMT  03/16/2019   IR RADIOLOGIST EVAL & MGMT  06/15/2019   IR RADIOLOGIST EVAL & MGMT  12/22/2019   IR RADIOLOGIST EVAL & MGMT  12/26/2020   IR RADIOLOGIST EVAL & MGMT  12/26/2021   IR RADIOLOGIST EVAL & MGMT  03/09/2023   PACEMAKER IMPLANT N/A 12/10/2023   Procedure: PACEMAKER IMPLANT;  Surgeon: Waddell Danelle ORN, MD;  Location: [facility];  Service: Cardiovascular;  Laterality: N/A;   POLYPECTOMY  04/21/2022   Procedure: POLYPECTOMY;  Surgeon: Shaaron Lamar CHRISTELLA, MD;  Location: [facility];  Service: Endoscopy;;   RADIOLOGY WITH ANESTHESIA Right 02/16/2019   Procedure: CT CRYOABLATION RIGHT RENAL MASS;  Surgeon: Vanice Sharper, MD;  Location: [facility];  Service: Anesthesiology;  Laterality: Right;   TONSILLECTOMY  1965    Allergies: Peach [prunus persica]  Medications: Prior to Admission medications   Medication Sig Start Date End Date Taking? Authorizing Provider  acetaminophen  (TYLENOL ) 500 MG tablet Take 1,000 mg by mouth every 6 (six) hours as needed for mild pain (pain score 1-3).    [provider]  albuterol  (PROVENTIL ) 2 MG tablet Take 2 mg by mouth 3 (three) times daily. 11/13/23   [provider]  aspirin EC 81 MG tablet Take 81 mg by mouth daily. Swallow  whole.    [provider]  Cholecalciferol (VITAMIN D3) 50 MCG (2000 UT) TABS Take 2,000 Units by mouth daily.    [provider]  doxycycline (VIBRAMYCIN) 100 MG capsule Take 100 mg by mouth 2 (two) times daily. 11/25/23   [provider]  FEROSUL 325 (65 Fe) MG tablet Take 325 mg by mouth 3 (three) times daily. 08/31/23   [provider]  GAVILYTE-G 236 g solution SMARTSIG:Milliliter(s) By Mouth 02/09/24   [provider]  lisinopril  (PRINIVIL ,ZESTRIL ) 10 MG tablet Take 10 mg by mouth in the morning.    [provider]  metFORMIN  (GLUCOPHAGE ) 1000 MG tablet Take 1,000 mg by mouth in the morning and at bedtime. 10/11/18   [provider]  pantoprazole  (PROTONIX ) 40 MG tablet Take 40 mg by mouth 2 (two) times daily. 02/20/22   [provider]  pravastatin  (PRAVACHOL ) 20 MG tablet Take 20 mg by mouth at bedtime.     [provider]  Tiotropium Bromide  Monohydrate (SPIRIVA  RESPIMAT) 2.5 MCG/ACT AERS Inhale 1 puff into the lungs 2 (two) times daily.    [provider]     Family History  Problem Relation Age of Onset   Alcohol abuse Mother        old age   Colon cancer Father        diagnosed in his early 42s   Breast cancer Daughter     Social History   Socioeconomic History   Marital status: Married    Spouse name: Not on file   Number of children: Not on file   Years of education: Not on file   Highest education level: Not on file  Occupational History   Not on file  Tobacco Use   Smoking status: Former    Current packs/day: 0.00    Types: Cigarettes    Quit date: 12/21/1994    Years since quitting: 29.2   Smokeless tobacco: Never  Vaping Use   Vaping status: Never Used  Substance and Sexual Activity   Alcohol use: No    Alcohol/week: 0.0 standard drinks of alcohol    Comment: quit drinking about 1996   Drug use: No   Sexual activity: Not Currently  Other Topics Concern   Not on file   Social History Narrative   Not on file   Social Drivers of Health   Financial Resource Strain: Not on file  Food Insecurity: No Food Insecurity (10/20/2023)   Hunger Vital Sign    Worried About Running Out of Food in the Last Year: Never true    Ran Out of Food in the Last Year: Never true  Transportation Needs: No Transportation Needs (10/20/2023)   PRAPARE - Administrator, Civil Service (Medical): No    Lack of Transportation (Non-Medical): No  Physical Activity: Not on file  Stress: Not on file  Social Connections: Socially Isolated (  10/15/2023)   Social Connection and Isolation Panel    Frequency of Communication with Friends and Family: More than three times a week    Frequency of Social Gatherings with Friends and Family: More than three times a week    Attends Religious Services: Never    Database Administrator or Organizations: No    Attends Banker Meetings: Never    Marital Status: Widowed       Review of Systems  Review of Systems: A 12 point ROS discussed and pertinent positives are indicated in the HPI above.  All other systems are negative.      Physical Exam Constitutional:      General: She is not in acute distress.    Appearance: She is not toxic-appearing.  Eyes:     General: No scleral icterus.    Conjunctiva/sclera: Conjunctivae normal.  Skin:    Coloration: Skin is not jaundiced.  Neurological:     General: No focal deficit present.     Mental Status: She is alert.  Psychiatric:        Mood and Affect: Mood normal.        Thought Content: Thought content normal.    No direct physical exam was performed (except for noted visual exam findings with Video Visits).    Vital Signs: There were no vitals taken for this visit.  Imaging: CT ABDOMEN W WO CONTRAST Result Date: 03/24/2024 CLINICAL DATA:  Follow-up cryo ablation right kidney EXAM: CT ABDOMEN WITHOUT AND WITH CONTRAST TECHNIQUE: Multidetector CT imaging of the  abdomen was performed following the standard protocol before and following the bolus administration of intravenous contrast. RADIATION DOSE REDUCTION: This exam was performed according to the departmental dose-optimization program which includes automated exposure control, adjustment of the mA and/or kV according to patient size and/or use of iterative reconstruction technique. CONTRAST:  ISOVUE-300 IOPAMIDOL (ISOVUE-300) INJECTION 61% COMPARISON:  CT 09/10/2023, 02/25/2023, 12/24/2021, multiple prior exams dating back to MRI 12/30/2018 FINDINGS: Lower chest: Lung bases demonstrate no acute airspace disease. Mild scarring or atelectasis at the lingula. Partially visualized cardiac pacing leads. Hepatobiliary: Hepatic steatosis. Stable 4.9 cm left hepatic cyst. No calcified gallstone or biliary dilatation Pancreas: Unremarkable. No pancreatic ductal dilatation or surrounding inflammatory changes. Spleen: Normal in size without focal abnormality. Adrenals/Urinary Tract: Adrenal glands are normal. Bilateral renal cysts for which no specific imaging follow-up is recommended. Stable ablation changes at the upper pole right kidney without definitive evidence for residual or recurrent tumor. Nonenhancing slightly dense 12 mm cyst in the mid right kidney for which no imaging follow-up recommended. This finding is stable. Interim development of 8 mm hypodense lesion lower pole left kidney, series 4, image 65, too small to further characterize. Stomach/Bowel: Stomach within normal limits. No dilated small bowel. No acute bowel wall thickening Vascular/Lymphatic: Aortic atherosclerosis. No enlarged abdominal lymph nodes. Other: Negative for ascites or free air Musculoskeletal: No acute or suspicious osseous abnormality. IMPRESSION: 1. Stable ablation changes at the upper pole right kidney without definitive evidence for residual or recurrent tumor. 2. Interim development of 8 mm hypodense lesion lower pole left kidney,  too small to further characterize. Attention on follow-up imaging. 3. Hepatic steatosis. 4. Aortic atherosclerosis. Aortic Atherosclerosis (ICD10-I70.0). Electronically Signed   By: Luke Bun M.D.   On: 03/24/2024 22:56    Labs:  CBC: Recent Labs    10/19/23 1436 11/09/23 1652 12/03/23 1118 02/04/24 1332  WBC 8.6 7.1 8.1 6.7  HGB 12.1 11.6 11.7  12.7  HCT 38.8 36.1 37.1 37.6  PLT 227 234 240 240    COAGS: Recent Labs    09/10/23 1310 10/15/23 1512  INR 1.1 1.1  APTT 25  --     BMP: Recent Labs    09/11/23 0401 09/12/23 0528 10/15/23 1512 10/16/23 0419 10/19/23 1436 11/09/23 1652 12/03/23 1118  NA 138 138 135 139 138 140 140  K 3.1* 4.0 3.3* 4.3 4.4 4.3 4.9  CL 101 99 97* 103 98 100 102  CO2 30 29 23 27 20 22 23   GLUCOSE 97 120* 84 87 111* 91 90  BUN 15 15 15 14 12 8 15   CALCIUM 8.6* 8.7* 9.2 9.3 10.2 9.4 9.7  CREATININE 0.66 0.76 0.76 0.79 0.98 0.67 0.90  GFRNONAA >60 >60 >60 >60  --   --   --     LIVER FUNCTION TESTS: Recent Labs    09/10/23 1310 09/12/23 0528  BILITOT 0.4 0.7  AST 27 23  ALT 13 15  ALKPHOS 50 51  PROT 5.9* 5.7*  ALBUMIN 3.3* 2.9*       Assessment and Plan:  5-year status post small right renal neoplasm image guided cryoablation.  Annual surveillance confirms a stable ablation defect.  No signs of residual or recurrent disease.  No acute renal abnormality.  There remain bilateral renal cysts some of which are new and too small to definitively characterize.  Imaging findings discussed today by telehealth video visit with the patient's daughter.  All questions addressed.  Overall she continues to do very well.  Plan: Because of the new small subcentimeter probable cyst in the left kidney lower pole, recommend additional annual CT surveillance to confirm that these are benign.  (October 2026 at [facility])     Electronically Signed: Ozell Specking 03/29/2024, 8:26 AM   I spent a total of    25  Minutes in remote  clinical consultation, greater than 50% of which was counseling/coordinating care for This patient status post previous right renal neoplasm cryoablation.    Visit type: Audio and video (MyChart video).     This format is felt to be most appropriate for this patient at this time.  All issues noted in this document were discussed and addressed.

## 2024-04-26 ENCOUNTER — Ambulatory Visit

## 2024-04-26 ENCOUNTER — Encounter: Payer: Self-pay | Admitting: *Deleted

## 2024-04-26 DIAGNOSIS — I441 Atrioventricular block, second degree: Secondary | ICD-10-CM

## 2024-04-26 NOTE — Progress Notes (Signed)
 Kristine Zamora                                          MRN: [id number]   04/26/2024   The [facility] Quality Team Specialist reviewed this patient medical record for the purposes of chart review for care gap closure. The following were reviewed: chart review for care gap closure-kidney health evaluation for diabetes:eGFR  and uACR.    [facility] Quality Team

## 2024-04-27 LAB — CUP PACEART REMOTE DEVICE CHECK
Battery Remaining Longevity: 148 mo
Battery Voltage: 3.17 V
Brady Statistic AP VP Percent: 3.41 %
Brady Statistic AP VS Percent: 1.29 %
Brady Statistic AS VP Percent: 47.34 %
Brady Statistic AS VS Percent: 47.96 %
Brady Statistic RA Percent Paced: 5.05 %
Brady Statistic RV Percent Paced: 50.76 %
Date Time Interrogation Session: 20251124191445
Implantable Lead Connection Status: 753985
Implantable Lead Connection Status: 753985
Implantable Lead Implant Date: 20250710
Implantable Lead Implant Date: 20250710
Implantable Lead Location: 753859
Implantable Lead Location: 753860
Implantable Lead Model: 3830
Implantable Lead Model: 5076
Implantable Pulse Generator Implant Date: 20250710
Lead Channel Impedance Value: 380 Ohm
Lead Channel Impedance Value: 399 Ohm
Lead Channel Impedance Value: 551 Ohm
Lead Channel Impedance Value: 646 Ohm
Lead Channel Pacing Threshold Amplitude: 0.625 V
Lead Channel Pacing Threshold Amplitude: 1.375 V
Lead Channel Pacing Threshold Pulse Width: 0.4 ms
Lead Channel Pacing Threshold Pulse Width: 0.4 ms
Lead Channel Sensing Intrinsic Amplitude: 13.375 mV
Lead Channel Sensing Intrinsic Amplitude: 13.375 mV
Lead Channel Sensing Intrinsic Amplitude: 2.125 mV
Lead Channel Sensing Intrinsic Amplitude: 2.125 mV
Lead Channel Setting Pacing Amplitude: 1.5 V
Lead Channel Setting Pacing Amplitude: 2.75 V
Lead Channel Setting Pacing Pulse Width: 0.4 ms
Lead Channel Setting Sensing Sensitivity: 1.2 mV
Zone Setting Status: 755011

## 2024-04-29 NOTE — Progress Notes (Signed)
 Remote PPM Transmission

## 2024-05-04 ENCOUNTER — Ambulatory Visit: Payer: Self-pay | Admitting: Internal Medicine

## 2024-05-12 ENCOUNTER — Inpatient Hospital Stay: Attending: Hematology

## 2024-05-12 DIAGNOSIS — D509 Iron deficiency anemia, unspecified: Secondary | ICD-10-CM | POA: Diagnosis present

## 2024-05-12 DIAGNOSIS — D508 Other iron deficiency anemias: Secondary | ICD-10-CM

## 2024-05-12 LAB — CBC WITH DIFFERENTIAL/PLATELET
Abs Immature Granulocytes: 0.03 K/uL (ref 0.00–0.07)
Basophils Absolute: 0 K/uL (ref 0.0–0.1)
Basophils Relative: 1 %
Eosinophils Absolute: 0.2 K/uL (ref 0.0–0.5)
Eosinophils Relative: 3 %
HCT: 35.3 % — ABNORMAL LOW (ref 36.0–46.0)
Hemoglobin: 11.7 g/dL — ABNORMAL LOW (ref 12.0–15.0)
Immature Granulocytes: 1 %
Lymphocytes Relative: 28 %
Lymphs Abs: 1.8 K/uL (ref 0.7–4.0)
MCH: 30.2 pg (ref 26.0–34.0)
MCHC: 33.1 g/dL (ref 30.0–36.0)
MCV: 91 fL (ref 80.0–100.0)
Monocytes Absolute: 0.4 K/uL (ref 0.1–1.0)
Monocytes Relative: 5 %
Neutro Abs: 4.1 K/uL (ref 1.7–7.7)
Neutrophils Relative %: 62 %
Platelets: 225 K/uL (ref 150–400)
RBC: 3.88 MIL/uL (ref 3.87–5.11)
RDW: 12.9 % (ref 11.5–15.5)
WBC: 6.5 K/uL (ref 4.0–10.5)
nRBC: 0 % (ref 0.0–0.2)

## 2024-05-12 LAB — IRON AND TIBC
Iron: 65 ug/dL (ref 28–170)
Saturation Ratios: 21 % (ref 10.4–31.8)
TIBC: 308 ug/dL (ref 250–450)
UIBC: 243 ug/dL

## 2024-05-12 LAB — FERRITIN: Ferritin: 108 ng/mL (ref 11–307)

## 2024-05-19 ENCOUNTER — Inpatient Hospital Stay (HOSPITAL_BASED_OUTPATIENT_CLINIC_OR_DEPARTMENT_OTHER): Admitting: Oncology

## 2024-05-19 DIAGNOSIS — Z950 Presence of cardiac pacemaker: Secondary | ICD-10-CM

## 2024-05-19 DIAGNOSIS — Z808 Family history of malignant neoplasm of other organs or systems: Secondary | ICD-10-CM | POA: Diagnosis not present

## 2024-05-19 DIAGNOSIS — Z800 Family history of malignant neoplasm of digestive organs: Secondary | ICD-10-CM | POA: Diagnosis not present

## 2024-05-19 DIAGNOSIS — D649 Anemia, unspecified: Secondary | ICD-10-CM

## 2024-05-19 DIAGNOSIS — Z803 Family history of malignant neoplasm of breast: Secondary | ICD-10-CM | POA: Diagnosis not present

## 2024-05-19 DIAGNOSIS — Z85528 Personal history of other malignant neoplasm of kidney: Secondary | ICD-10-CM

## 2024-05-19 DIAGNOSIS — D509 Iron deficiency anemia, unspecified: Secondary | ICD-10-CM | POA: Diagnosis not present

## 2024-05-19 MED ORDER — FERROUS GLUCONATE 324 (38 FE) MG PO TABS: 324.0000 mg | ORAL_TABLET | ORAL | 0 refills | Status: DC

## 2024-05-19 NOTE — Assessment & Plan Note (Addendum)
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  She denies bleeding per rectum.  She does have some internal hemorrhoids and diverticulosis.  Denies any recent diverticulitis flare. - She received 3 doses of 300 mg IV Venofer  on 11/23/2023, 12/02/2023 and 12/09/2023.  Tolerated infusions well. -Repeat lab work from 05/12/2024 shows ferritin of 108, iron  saturation 21% and hemoglobin 11.7. -She may continue iron  tablets but would recommend reducing to every other day.  Take with vitamin C to increase absorption. -She had a colonoscopy on 02/26/2024 which showed diverticulosis in the entire colon, two 3 to 4 mm polyps in the cecum which were removed.  Pathology was positive for tubular adenoma but negative for high-grade dysplasia. -Return to clinic in 3 months with labs a few days before and see NP.

## 2024-05-19 NOTE — Progress Notes (Unsigned)
 [facility] OFFICE PROGRESS NOTE  Kristine Satterfield, MD  ASSESSMENT & PLAN:    I connected with Kristine Zamora on 05/19/2024 at  1:00 PM EST by telephone visit and verified that I am speaking with the correct person using two identifiers.   I discussed the limitations, risks, security and privacy concerns of performing an evaluation and management service by telemedicine and the availability of in-person appointments. I also discussed with the patient that there may be a patient responsible charge related to this service. The patient expressed understanding and agreed to proceed.   Other persons participating in the visit and their role in the encounter: NP, Patient    Patients location: Home  Providers location: Clinic  Assessment & Plan Anemia, unspecified type -Etiology likely secondary to malabsorption due to age and PPI use.  She denies bleeding per rectum.  She does have some internal hemorrhoids and diverticulosis.  Denies any recent diverticulitis flare. - She received 3 doses of 300 mg IV Venofer  on 11/23/2023, 12/02/2023 and 12/09/2023.  Tolerated infusions well. -Repeat lab work from 05/12/2024 shows ferritin of 108, iron  saturation 21% and hemoglobin 11.7. -She may continue iron  tablets but would recommend reducing to every other day.  Take with vitamin C to increase absorption. -She had a colonoscopy on 02/26/2024 which showed diverticulosis in the entire colon, two 3 to 4 mm polyps in the cecum which were removed.  Pathology was positive for tubular adenoma but negative for high-grade dysplasia. -Return to clinic in 3 months with labs a few days before and see NP.    No orders of the defined types were placed in this encounter.   INTERVAL HISTORY: Patient returns for follow-up.  She received 3 doses of IV Venofer  on 11/23/2023, 12/02/2023 and 12/09/2023.  Tolerated infusions well.  She is also taking iron  supplements daily.  Reports she stopped her iron  supplements  about a week ago in preparation for her colonoscopy.  States they told her her colon was not clean enough at her last attempt so she was concerned the iron  may hinder her prep for her colonoscopy.   She had a colonoscopy on 02/26/2024 which showed diverticulosis in the entire colon, two 3 to 4 mm polyps in the cecum which were removed.  Pathology was positive for tubular adenoma but negative for high-grade dysplasia.   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  She no longer is experiencing dizziness, fatigue or weakness.  Reports today feeling great.  Appetites been 100% energy levels are 75%.  Denies any pain.  She has shortness of breath occasionally.  Dizziness has almost completely resolved.  Has occasional insomnia.  We reviewed CBC, iron  panel, ferritin  SUMMARY OF HEMATOLOGIC HISTORY: Oncology History   No problem history exists.   1.  Severe iron  deficiency anemia: - Recent admission to the hospital on 09/10/2023 with hemoglobin 7.8, s/p 2 units PRBC.  Ferritin was 4, B12 291. - Started iron  tablet 3 times a day in February 2025, has not taken it in the last 1 week.  She gets constipated from iron  pills. - Denies BRBPR/melena.  Colonoscopy and EGD on 10/16/2023 - Hematology workup from 10/20/2023 shows normal B12, folate, MMA, copper  and LDH.  Iron  saturations are 6% with a ferritin of 39.  Reticulocytes and haptoglobin are both WNL.  Myeloma panel showed elevated kappa free light chains at 23.6 but normal lambda free light chains and light chain ratio.  Immunofixation pattern appears unremarkable.  No evidence of monoclonal protein is present.    2.  Small right renal neoplasm: - S/p cryoablation on 02/16/2019, follows with Dr. Majel   3.  Social/family history: - Lives by herself at home and is independent of ADLs and IADLs.  Non-smoker. - Sister and mother had anemia.  Sister had melanoma.  Another sister had colon cancer and breast cancer.  Maternal aunt had breast  cancer.  Father had colon cancer.  CBC    Component Value Date/Time   WBC 6.5 05/12/2024 1306   RBC 3.88 05/12/2024 1306   HGB 11.7 (L) 05/12/2024 1306   HGB 11.7 12/03/2023 1118   HCT 35.3 (L) 05/12/2024 1306   HCT 37.1 12/03/2023 1118   PLT 225 05/12/2024 1306   PLT 240 12/03/2023 1118   MCV 91.0 05/12/2024 1306   MCV 89 12/03/2023 1118   MCH 30.2 05/12/2024 1306   MCHC 33.1 05/12/2024 1306   RDW 12.9 05/12/2024 1306   RDW 15.6 (H) 12/03/2023 1118   LYMPHSABS 1.8 05/12/2024 1306   MONOABS 0.4 05/12/2024 1306   EOSABS 0.2 05/12/2024 1306   BASOSABS 0.0 05/12/2024 1306       Latest Ref Rng & Units 12/03/2023   11:18 AM 11/09/2023    4:52 PM 10/19/2023    2:36 PM  CMP  Glucose 70 - 99 mg/dL 90  91  888   BUN 8 - 27 mg/dL 15  8  12    Creatinine 0.57 - 1.00 mg/dL 9.09  9.32  9.01   Sodium 134 - 144 mmol/L 140  140  138   Potassium 3.5 - 5.2 mmol/L 4.9  4.3  4.4   Chloride 96 - 106 mmol/L 102  100  98   CO2 20 - 29 mmol/L 23  22  20    Calcium 8.7 - 10.3 mg/dL 9.7  9.4  89.7      Lab Results  Component Value Date   FERRITIN 108 05/12/2024   VITAMINB12 498 10/20/2023    There were no vitals filed for this visit.   Review of System:  Review of Systems  Constitutional:  Positive for malaise/fatigue.  Respiratory:  Positive for shortness of breath.   Psychiatric/Behavioral:  The patient has insomnia.     Physical Exam: Physical Exam Constitutional:      Appearance: Normal appearance.  HENT:     Head: Normocephalic and atraumatic.  Eyes:     Pupils: Pupils are equal, round, and reactive to light.  Cardiovascular:     Rate and Rhythm: Normal rate and regular rhythm.     Heart sounds: Normal heart sounds. No murmur heard. Pulmonary:     Effort: Pulmonary effort is normal.     Breath sounds: Normal breath sounds. No wheezing.  Abdominal:     General: Bowel sounds are normal. There is no distension.     Palpations: Abdomen is soft.     Tenderness: There is no  abdominal tenderness.  Musculoskeletal:        General: Normal range of motion.     Cervical back: Normal range of motion.  Skin:    General: Skin is warm and dry.     Findings: No rash.  Neurological:     Mental Status: She is alert and oriented to person, place, and time.     Gait: Gait is intact.  Psychiatric:        Mood and Affect: Mood and affect normal.        Cognition and  Memory: Memory normal.        Judgment: Judgment normal.      I spent 25 minutes dedicated to the care of this patient (face-to-face and non-face-to-face) on the date of the encounter to include what is described in the assessment and plan.,  Delon Hope, NP 05/19/2024 2:26 PM

## 2024-05-20 ENCOUNTER — Encounter: Payer: Self-pay | Admitting: Oncology

## 2024-05-30 ENCOUNTER — Encounter: Payer: Self-pay | Admitting: *Deleted

## 2024-05-31 ENCOUNTER — Inpatient Hospital Stay

## 2024-05-31 VITALS — BP 169/68 | HR 79 | Temp 97.9°F | Resp 20

## 2024-05-31 DIAGNOSIS — D509 Iron deficiency anemia, unspecified: Secondary | ICD-10-CM | POA: Diagnosis not present

## 2024-05-31 MED ORDER — SODIUM CHLORIDE 0.9 % IV SOLN: INTRAVENOUS | Status: DC

## 2024-05-31 MED ORDER — IRON SUCROSE 300 MG IVPB - SIMPLE MED
300.0000 mg | Freq: Once | Status: AC
  Administered 2024-05-31: 300 mg via INTRAVENOUS
  Filled 2024-05-31: qty 300

## 2024-05-31 NOTE — Progress Notes (Signed)
 Patient tolerated iron infusion with no complaints voiced.  Peripheral IV site clean and dry with good blood return noted before and after infusion. Pt observed for 30 minutes post iron without any complications.  VSS with discharge and left in satisfactory condition with no s/s of distress noted. All follow ups as scheduled.   Kristine Zamora

## 2024-05-31 NOTE — Patient Instructions (Signed)
 [facility] - A DEPT OF [facility]  Discharge Instructions: Thank you for choosing [facility] to provide your oncology and hematology care.  If you have a lab appointment with the Cancer Center - please note that after April 8th, 2024, all labs will be drawn in the cancer center.  You do not have to check in or register with the main entrance as you have in the past but will complete your check-in in the cancer center.  Wear comfortable clothing and clothing appropriate for easy access to any Portacath or PICC line.   We strive to give you quality time with your provider. You may need to reschedule your appointment if you arrive late (15 or more minutes).  Arriving late affects you and other patients whose appointments are after yours.  Also, if you miss three or more appointments without notifying the office, you may be dismissed from the clinic at the provider's discretion.      For prescription refill requests, have your pharmacy contact our office and allow 72 hours for refills to be completed.    Today you received the following iron  infusion: venofer     To help prevent nausea and vomiting after your treatment, we encourage you to take your nausea medication as directed.  BELOW ARE SYMPTOMS THAT SHOULD BE REPORTED IMMEDIATELY: *FEVER GREATER THAN 100.4 F (38 C) OR HIGHER *CHILLS OR SWEATING *NAUSEA AND VOMITING THAT IS NOT CONTROLLED WITH YOUR NAUSEA MEDICATION *UNUSUAL SHORTNESS OF BREATH *UNUSUAL BRUISING OR BLEEDING *URINARY PROBLEMS (pain or burning when urinating, or frequent urination) *BOWEL PROBLEMS (unusual diarrhea, constipation, pain near the anus) TENDERNESS IN MOUTH AND THROAT WITH OR WITHOUT PRESENCE OF ULCERS (sore throat, sores in mouth, or a toothache) UNUSUAL RASH, SWELLING OR PAIN  UNUSUAL VAGINAL DISCHARGE OR ITCHING   Items with * indicate a potential emergency and should be followed up as soon as possible or go to  the Emergency Department if any problems should occur.  Please show the CHEMOTHERAPY ALERT CARD or IMMUNOTHERAPY ALERT CARD at check-in to the Emergency Department and triage nurse.  Should you have questions after your visit or need to cancel or reschedule your appointment, please contact [facility] [place] - A DEPT OF [facility] [phone]  and follow the prompts.  Office hours are 8:00 a.m. to 4:30 p.m. Monday - Friday. Please note that voicemails left after 4:00 p.m. may not be returned until the following business day.  We are closed weekends and major holidays. You have access to a nurse at all times for urgent questions. Please call the main number to the clinic [phone] and follow the prompts.  For any non-urgent questions, you may also contact your provider using MyChart. We now offer e-Visits for anyone 2 and older to request care online for non-urgent symptoms. For details visit [email or web address].   Also download the MyChart app! Go to the app store, search MyChart, open the app, select [place], and log in with your MyChart username and password.

## 2024-06-23 ENCOUNTER — Encounter: Payer: Self-pay | Admitting: Physician Assistant

## 2024-06-23 ENCOUNTER — Ambulatory Visit: Admitting: Physician Assistant

## 2024-06-23 VITALS — BP 128/66 | HR 75 | Temp 97.5°F | Ht 62.0 in | Wt 198.4 lb

## 2024-06-23 DIAGNOSIS — E119 Type 2 diabetes mellitus without complications: Secondary | ICD-10-CM

## 2024-06-23 DIAGNOSIS — D509 Iron deficiency anemia, unspecified: Secondary | ICD-10-CM

## 2024-06-23 DIAGNOSIS — I10 Essential (primary) hypertension: Secondary | ICD-10-CM

## 2024-06-23 DIAGNOSIS — E785 Hyperlipidemia, unspecified: Secondary | ICD-10-CM | POA: Diagnosis not present

## 2024-06-23 DIAGNOSIS — H9211 Otorrhea, right ear: Secondary | ICD-10-CM | POA: Diagnosis not present

## 2024-06-23 DIAGNOSIS — Z950 Presence of cardiac pacemaker: Secondary | ICD-10-CM

## 2024-06-23 DIAGNOSIS — R1310 Dysphagia, unspecified: Secondary | ICD-10-CM

## 2024-06-23 DIAGNOSIS — Z23 Encounter for immunization: Secondary | ICD-10-CM

## 2024-06-23 DIAGNOSIS — Z7984 Long term (current) use of oral hypoglycemic drugs: Secondary | ICD-10-CM | POA: Diagnosis not present

## 2024-06-23 MED ORDER — LISINOPRIL 10 MG PO TABS: 10.0000 mg | ORAL_TABLET | Freq: Every morning | ORAL | 1 refills | Status: AC

## 2024-06-23 MED ORDER — METFORMIN HCL 1000 MG PO TABS: 1000.0000 mg | ORAL_TABLET | Freq: Two times a day (BID) | ORAL | 1 refills | Status: AC

## 2024-06-23 MED ORDER — PRAVASTATIN SODIUM 20 MG PO TABS: 20.0000 mg | ORAL_TABLET | Freq: Every day | ORAL | 1 refills | Status: AC

## 2024-06-23 MED ORDER — FERROUS GLUCONATE 324 (38 FE) MG PO TABS: 324.0000 mg | ORAL_TABLET | ORAL | 0 refills | Status: AC

## 2024-06-23 MED ORDER — PANTOPRAZOLE SODIUM 40 MG PO TBEC: 40.0000 mg | DELAYED_RELEASE_TABLET | Freq: Two times a day (BID) | ORAL | 1 refills | Status: AC

## 2024-06-23 NOTE — Assessment & Plan Note (Signed)
 Atrial fibrillation managed with pacemaker. Heart rhythm remains normal post-pacemaker placement. Needs new referral due to insurance stipulations.  - Referred to cardiology for ongoing management of atrial fibrillation and pacemaker follow-up

## 2024-06-23 NOTE — Assessment & Plan Note (Signed)
 Managed with iron  infusions. Blood work shows iron  levels below normal, but infusions improve energy levels. Confusion regarding oral iron  supplementation. Hematology follow up scheduled in March.  - Clarified iron  prescription based on hematology note from 05/19/24. Refill sent to pharmacy.

## 2024-06-23 NOTE — Assessment & Plan Note (Signed)
 Patient presents today with 1 month of right ear otorrhea with associated dizziness and itchiness. Examination reveals increased white buildup behind the eardrum. Differential includes cholesteatoma, referring to ENT for further evaluation considering associated dizziness. - Referred to ENT for evaluation of right ear otorrhea and dizziness. - Recommended over-the-counter Flonase nasal spray once daily at night to alleviate ear pressure and itchiness.

## 2024-06-23 NOTE — Assessment & Plan Note (Signed)
 128/66 Controlled. Continue current medications. No change in management. Discussed DASH diet and dietary sodium restrictions.  Continue dietary efforts and physical activity.

## 2024-06-23 NOTE — Assessment & Plan Note (Signed)
 Intermittent dysphagia, particularly with fish, causing sensation of food and water  getting stuck in the chest. No associated pain. Possible esophageal narrowing or blockage. - Referred to GI for evaluation of dysphagia and possible EGD. - Continue Protonix .

## 2024-06-23 NOTE — Assessment & Plan Note (Signed)
 Stable. Continue with current management without changes. Discussed healthy diet and lifestyle.

## 2024-06-23 NOTE — Assessment & Plan Note (Signed)
 Patient presents on metformin  daily. Was told by her previous PCP she is diabetic, but states lab work is not consistent with diabetes. A1c today. Continue metformin  pending lab work.

## 2024-06-23 NOTE — Progress Notes (Signed)
 "  New Patient Office Visit  Subjective    Patient ID: Kristine Zamora, female    DOB: September 21, 1945  Age: 79 y.o. MRN: [id number]  CC:  Chief Complaint  Patient presents with   Establish Care    Pt is wondering about medications - why am I on so many if bloodwork is normal    Ear Pain    Pt is having right ear pain    HPI Kristine Zamora presents to establish care  Discussed the use of AI scribe software for clinical note transcription with the patient, who gave verbal consent to proceed.  History of Present Illness Kristine Zamora is a 79 year old female with atrial fibrillation and iron  deficiency anemia who presents with right ear drainage and medication clarification.  She has been experiencing right ear drainage and pain for over a month. The ear is described as 'running,' necessitating the use of cotton. She also reports sinus congestion, which she believes is related to the ear issue, along with itchiness in both ears and eyes. Occasionally, she wakes up with crusty discharge near her right ear. A history of a hole in her ear is noted, which she suspects might contribute to her dizziness.  Her current medications include metformin , lisinopril , protonix , and pravastatin . She was previously instructed to take iron  tablets three times a day for iron  deficiency anemia, later reduced to once a day, and then every other day. She receives iron  infusions at the hospital, which improve her energy levels. There is confusion regarding the need for a referral for her infusions due to insurance requirements.  She has a history of atrial fibrillation and has a pacemaker. She experiences dizziness, particularly when getting out of the car or going downhill, and has been evaluated for vertigo in the past without a definitive diagnosis.  She experiences difficulty swallowing, particularly with fish, where food feels stuck in her chest but eventually goes down. Occasionally endorses difficulty  swallowing water . She takes Protonix  for acid reflux, which reportedly helps with the sensation of food getting stuck.  No new chest pain or shortness of breath is reported, and she is able to perform her usual activities. Dizziness is experienced daily, which has not been attributed to a specific cause despite previous evaluations.   Outpatient Encounter Medications as of 06/23/2024  Medication Sig   acetaminophen  (TYLENOL ) 500 MG tablet Take 1,000 mg by mouth every 6 (six) hours as needed for mild pain (pain score 1-3).   aspirin EC 81 MG tablet Take 81 mg by mouth daily. Swallow whole.   Cholecalciferol (VITAMIN D3) 50 MCG (2000 UT) TABS Take 2,000 Units by mouth daily.   [DISCONTINUED] albuterol  (PROVENTIL ) 2 MG tablet Take 2 mg by mouth 3 (three) times daily.   [DISCONTINUED] doxycycline (VIBRAMYCIN) 100 MG capsule Take 100 mg by mouth 2 (two) times daily.   [DISCONTINUED] FEROSUL 325 (65 Fe) MG tablet Take 325 mg by mouth 3 (three) times daily.   [DISCONTINUED] ferrous gluconate  (FERGON) 324 MG tablet Take 1 tablet (324 mg total) by mouth every other day.   [DISCONTINUED] GAVILYTE-G 236 g solution SMARTSIG:Milliliter(s) By Mouth   [DISCONTINUED] lisinopril  (PRINIVIL ,ZESTRIL ) 10 MG tablet Take 10 mg by mouth in the morning.   [DISCONTINUED] metFORMIN  (GLUCOPHAGE ) 1000 MG tablet Take 1,000 mg by mouth in the morning and at bedtime.   [DISCONTINUED] pantoprazole  (PROTONIX ) 40 MG tablet Take 40 mg by mouth 2 (two) times daily.   [DISCONTINUED] pravastatin  (PRAVACHOL ) 20 MG tablet  Take 20 mg by mouth at bedtime.    [DISCONTINUED] Tiotropium Bromide  Monohydrate (SPIRIVA  RESPIMAT) 2.5 MCG/ACT AERS Inhale 1 puff into the lungs 2 (two) times daily.   ferrous gluconate  (FERGON) 324 MG tablet Take 1 tablet (324 mg total) by mouth every other day.   lisinopril  (ZESTRIL ) 10 MG tablet Take 1 tablet (10 mg total) by mouth in the morning.   metFORMIN  (GLUCOPHAGE ) 1000 MG tablet Take 1 tablet (1,000 mg  total) by mouth in the morning and at bedtime.   pantoprazole  (PROTONIX ) 40 MG tablet Take 1 tablet (40 mg total) by mouth 2 (two) times daily.   pravastatin  (PRAVACHOL ) 20 MG tablet Take 1 tablet (20 mg total) by mouth at bedtime.   No facility-administered encounter medications on file as of 06/23/2024.    Past Medical History:  Diagnosis Date   Anemia    Arthritis    HANDS AND FEET   Asthma    Back pain    Diabetes mellitus without complication (HCC)    TYPE 2   Dyspnea    with exersion    GERD (gastroesophageal reflux disease)    High cholesterol    Hypertension    Lichen planus atrophicus    PONV (postoperative nausea and vomiting)    Right renal mass     Past Surgical History:  Procedure Laterality Date   BREAST SURGERY  1966   MILK TUBE REMOVED   COLONOSCOPY  2008   Dr. Shaaron: pancolonic diverticula, hyperplastic rectosigmoid polyp   COLONOSCOPY N/A 01/16/2015   Procedure: COLONOSCOPY;  Surgeon: Lamar CHRISTELLA Shaaron, MD;  Location: [facility];  Service: Endoscopy;  Laterality: N/A;  0900   COLONOSCOPY N/A 10/16/2023   Procedure: COLONOSCOPY;  Surgeon: Shaaron Lamar CHRISTELLA, MD;  Location: [facility];  Service: Endoscopy;  Laterality: N/A;   COLONOSCOPY N/A 02/26/2024   Procedure: COLONOSCOPY;  Surgeon: Shaaron Lamar CHRISTELLA, MD;  Location: [facility];  Service: Endoscopy;  Laterality: N/A;  1:00 pm, asa 3   COLONOSCOPY WITH PROPOFOL  N/A 04/21/2022   Procedure: COLONOSCOPY WITH PROPOFOL ;  Surgeon: Shaaron Lamar CHRISTELLA, MD;  Location: [facility];  Service: Endoscopy;  Laterality: N/A;  10:30 AM   ESOPHAGOGASTRODUODENOSCOPY N/A 10/16/2023   Procedure: EGD (ESOPHAGOGASTRODUODENOSCOPY);  Surgeon: Shaaron Lamar CHRISTELLA, MD;  Location: [facility];  Service: Endoscopy;  Laterality: N/A;   IR RADIOLOGIST EVAL & MGMT  01/19/2019   IR RADIOLOGIST EVAL & MGMT  03/16/2019   IR RADIOLOGIST EVAL & MGMT  06/15/2019   IR RADIOLOGIST EVAL & MGMT  12/22/2019   IR RADIOLOGIST EVAL & MGMT  12/26/2020   IR  RADIOLOGIST EVAL & MGMT  12/26/2021   IR RADIOLOGIST EVAL & MGMT  03/09/2023   IR RADIOLOGIST EVAL & MGMT  03/29/2024   PACEMAKER IMPLANT N/A 12/10/2023   Procedure: PACEMAKER IMPLANT;  Surgeon: Waddell Danelle ORN, MD;  Location: [facility];  Service: Cardiovascular;  Laterality: N/A;   POLYPECTOMY  04/21/2022   Procedure: POLYPECTOMY;  Surgeon: Shaaron Lamar CHRISTELLA, MD;  Location: [facility];  Service: Endoscopy;;   RADIOLOGY WITH ANESTHESIA Right 02/16/2019   Procedure: CT CRYOABLATION RIGHT RENAL MASS;  Surgeon: Vanice Sharper, MD;  Location: [facility];  Service: Anesthesiology;  Laterality: Right;   TONSILLECTOMY  1965    Family History  Problem Relation Age of Onset   Alcohol abuse Mother        old age   Colon cancer Father  diagnosed in his early 34s   Breast cancer Daughter     Social History   Socioeconomic History   Marital status: Married    Spouse name: Not on file   Number of children: Not on file   Years of education: Not on file   Highest education level: Not on file  Occupational History   Not on file  Tobacco Use   Smoking status: Former    Current packs/day: 0.00    Types: Cigarettes    Quit date: 12/21/1994    Years since quitting: 29.5   Smokeless tobacco: Never  Vaping Use   Vaping status: Never Used  Substance and Sexual Activity   Alcohol use: No    Alcohol/week: 0.0 standard drinks of alcohol    Comment: quit drinking about 1996   Drug use: No   Sexual activity: Not Currently  Other Topics Concern   Not on file  Social History Narrative   Not on file   Social Drivers of Health   Tobacco Use: Medium Risk (03/23/2024)   Patient History    Smoking Tobacco Use: Former    Smokeless Tobacco Use: Never    Passive Exposure: Not on Actuary Strain: Not on file  Food Insecurity: No Food Insecurity (10/20/2023)   Hunger Vital Sign    Worried About Running Out of Food in the Last Year: Never true    Ran Out of Food in the Last  Year: Never true  Transportation Needs: No Transportation Needs (10/20/2023)   PRAPARE - Administrator, Civil Service (Medical): No    Lack of Transportation (Non-Medical): No  Physical Activity: Not on file  Stress: Not on file  Social Connections: Socially Isolated (10/15/2023)   Social Connection and Isolation Panel    Frequency of Communication with Friends and Family: More than three times a week    Frequency of Social Gatherings with Friends and Family: More than three times a week    Attends Religious Services: Never    Database Administrator or Organizations: No    Attends Banker Meetings: Never    Marital Status: Widowed  Intimate Partner Violence: Not At Risk (10/20/2023)   Humiliation, Afraid, Rape, and Kick questionnaire    Fear of Current or Ex-Partner: No    Emotionally Abused: No    Physically Abused: No    Sexually Abused: No  Depression (PHQ2-9): Low Risk (06/23/2024)   Depression (PHQ2-9)    PHQ-2 Score: 0  Alcohol Screen: Not on file  Housing: Low Risk (10/20/2023)   Housing Stability Vital Sign    Unable to Pay for Housing in the Last Year: No    Number of Times Moved in the Last Year: 0    Homeless in the Last Year: No  Utilities: Not At Risk (10/20/2023)   [employer] Utilities    Threatened with loss of utilities: No  Health Literacy: Not on file    Review of Systems  Constitutional:  Negative for activity change, appetite change, fatigue and fever.  HENT:  Positive for ear discharge and ear pain.   Eyes:  Negative for visual disturbance.  Respiratory:  Negative for cough and shortness of breath.   Cardiovascular:  Negative for chest pain.  Neurological:  Positive for dizziness. Negative for light-headedness and headaches.  Psychiatric/Behavioral:  Negative for agitation and decreased concentration. The patient is not nervous/anxious.        Objective    BP 128/66  Pulse 75   Temp (!) 97.5 F (36.4 C)   Ht 5' 2 (1.575 m)   Wt  198 lb 6.4 oz (90 kg)   SpO2 97%   BMI 36.29 kg/m   Physical Exam Constitutional:      General: She is not in acute distress.    Appearance: Normal appearance. She is obese. She is not ill-appearing.  HENT:     Head: Normocephalic and atraumatic.     Right Ear: Hearing normal. No drainage, swelling or tenderness. Tympanic membrane is not erythematous.     Left Ear: Hearing normal. No drainage. Tympanic membrane is not erythematous.     Ears:     Comments: Increased white appearance behind right TM     Mouth/Throat:     Mouth: Mucous membranes are moist.     Pharynx: Oropharynx is clear.  Eyes:     Extraocular Movements: Extraocular movements intact.     Conjunctiva/sclera: Conjunctivae normal.  Cardiovascular:     Rate and Rhythm: Normal rate and regular rhythm.     Heart sounds: Normal heart sounds. No murmur heard. Pulmonary:     Effort: Pulmonary effort is normal.     Breath sounds: Normal breath sounds. No wheezing, rhonchi or rales.  Musculoskeletal:     Right lower leg: No edema.     Left lower leg: No edema.  Skin:    General: Skin is warm and dry.  Neurological:     General: No focal deficit present.     Mental Status: She is alert and oriented to person, place, and time.  Psychiatric:        Mood and Affect: Mood normal.        Behavior: Behavior normal.       Assessment & Plan:  Drainage from right ear Assessment & Plan: Patient presents today with 1 month of right ear otorrhea with associated dizziness and itchiness. Examination reveals increased white buildup behind the eardrum. Differential includes cholesteatoma, referring to ENT for further evaluation considering associated dizziness. - Referred to ENT for evaluation of right ear otorrhea and dizziness. - Recommended over-the-counter Flonase nasal spray once daily at night to alleviate ear pressure and itchiness.  Orders: -     Ambulatory referral to ENT  Dysphagia, unspecified type Assessment &  Plan: Intermittent dysphagia, particularly with fish, causing sensation of food and water  getting stuck in the chest. No associated pain. Possible esophageal narrowing or blockage. - Referred to GI for evaluation of dysphagia and possible EGD. - Continue Protonix .    Orders: -     Ambulatory referral to Gastroenterology -     Pantoprazole  Sodium; Take 1 tablet (40 mg total) by mouth 2 (two) times daily.  Dispense: 180 tablet; Refill: 1  Primary hypertension Assessment & Plan: 128/66 Controlled. Continue current medications. No change in management. Discussed DASH diet and dietary sodium restrictions.  Continue dietary efforts and physical activity.   Orders: -     Lisinopril ; Take 1 tablet (10 mg total) by mouth in the morning.  Dispense: 90 tablet; Refill: 1 -     CMP14+EGFR  Diabetes mellitus treated with oral medication [facility]) Assessment & Plan: Patient presents on metformin  daily. Was told by her previous PCP she is diabetic, but states lab work is not consistent with diabetes. A1c today. Continue metformin  pending lab work.    Orders: -     metFORMIN  HCl; Take 1 tablet (1,000 mg total) by mouth in the morning and at bedtime.  Dispense: 180 tablet; Refill: 1 -     Hemoglobin A1c  Hyperlipidemia, unspecified hyperlipidemia type Assessment & Plan: Stable. Continue with current management without changes. Discussed healthy diet and lifestyle.   Orders: -     Pravastatin  Sodium; Take 1 tablet (20 mg total) by mouth at bedtime.  Dispense: 90 tablet; Refill: 1 -     Lipid panel  Iron  deficiency anemia, unspecified iron  deficiency anemia type Assessment & Plan: Managed with iron  infusions. Blood work shows iron  levels below normal, but infusions improve energy levels. Confusion regarding oral iron  supplementation. Hematology follow up scheduled in March.  - Clarified iron  prescription based on hematology note from 05/19/24. Refill sent to pharmacy.   Orders: -     Ferrous  Gluconate; Take 1 tablet (324 mg total) by mouth every other day.  Dispense: 90 tablet; Refill: 0  S/P cardiac pacemaker procedure Assessment & Plan: Atrial fibrillation managed with pacemaker. Heart rhythm remains normal post-pacemaker placement. Needs new referral due to insurance stipulations.  - Referred to cardiology for ongoing management of atrial fibrillation and pacemaker follow-up  Orders: -     Ambulatory referral to Cardiology  Other orders -     Flu vaccine HIGH DOSE PF(Fluzone Trivalent)   Return in about 6 months (around 12/21/2024) for gen f/u, sooner as needed.   Lacrecia Delval, PA-C   "

## 2024-06-24 ENCOUNTER — Ambulatory Visit: Payer: Self-pay | Admitting: Physician Assistant

## 2024-06-24 LAB — CMP14+EGFR
ALT: 9 IU/L (ref 0–32)
AST: 17 IU/L (ref 0–40)
Albumin: 4.5 g/dL (ref 3.8–4.8)
Alkaline Phosphatase: 62 IU/L (ref 49–135)
BUN/Creatinine Ratio: 15 (ref 12–28)
BUN: 13 mg/dL (ref 8–27)
Bilirubin Total: 0.6 mg/dL (ref 0.0–1.2)
CO2: 22 mmol/L (ref 20–29)
Calcium: 9.8 mg/dL (ref 8.7–10.3)
Chloride: 102 mmol/L (ref 96–106)
Creatinine, Ser: 0.84 mg/dL (ref 0.57–1.00)
Globulin, Total: 1.8 g/dL (ref 1.5–4.5)
Glucose: 100 mg/dL — ABNORMAL HIGH (ref 70–99)
Potassium: 4.2 mmol/L (ref 3.5–5.2)
Sodium: 141 mmol/L (ref 134–144)
Total Protein: 6.3 g/dL (ref 6.0–8.5)
eGFR: 71 mL/min/1.73

## 2024-06-24 LAB — LIPID PANEL
Chol/HDL Ratio: 2.2 ratio (ref 0.0–4.4)
Cholesterol, Total: 189 mg/dL (ref 100–199)
HDL: 87 mg/dL
LDL Chol Calc (NIH): 84 mg/dL (ref 0–99)
Triglycerides: 101 mg/dL (ref 0–149)
VLDL Cholesterol Cal: 18 mg/dL (ref 5–40)

## 2024-06-24 LAB — HEMOGLOBIN A1C
Est. average glucose Bld gHb Est-mCnc: 105 mg/dL
Hgb A1c MFr Bld: 5.3 % (ref 4.8–5.6)

## 2024-06-24 NOTE — Telephone Encounter (Signed)
 Spoke with pt's daughter and informed of her results , she verbalized understanding  Copied from CRM [id number]. Topic: Clinical - Lab/Test Results >> Jun 24, 2024  2:35 PM Emylou G wrote: Reason for CRM: adv patient of labs/referral details

## 2024-07-20 ENCOUNTER — Ambulatory Visit: Admitting: Internal Medicine

## 2024-07-26 ENCOUNTER — Encounter

## 2024-07-27 ENCOUNTER — Institutional Professional Consult (permissible substitution) (INDEPENDENT_AMBULATORY_CARE_PROVIDER_SITE_OTHER): Admitting: Otolaryngology

## 2024-08-23 ENCOUNTER — Inpatient Hospital Stay

## 2024-08-30 ENCOUNTER — Inpatient Hospital Stay: Admitting: Oncology

## 2024-10-25 ENCOUNTER — Encounter

## 2024-12-21 ENCOUNTER — Ambulatory Visit: Admitting: Physician Assistant

## 2025-01-24 ENCOUNTER — Encounter
